# Patient Record
Sex: Female | Born: 1960 | Race: White | Hispanic: No | Marital: Married | Smoking: Never smoker
Health system: Southern US, Community
[De-identification: ages and names within clinical notes are randomized; demographics above are authoritative.]

## PROBLEM LIST (undated history)

## (undated) DIAGNOSIS — F909 Attention-deficit hyperactivity disorder, unspecified type: Secondary | ICD-10-CM

## (undated) DIAGNOSIS — Z860100 Personal history of colon polyps, unspecified: Secondary | ICD-10-CM

## (undated) DIAGNOSIS — B019 Varicella without complication: Secondary | ICD-10-CM

## (undated) HISTORY — DX: Attention-deficit hyperactivity disorder, unspecified type: F90.9

## (undated) HISTORY — DX: Personal history of colon polyps, unspecified: Z86.0100

## (undated) HISTORY — DX: Varicella without complication: B01.9

## (undated) HISTORY — DX: Personal history of colonic polyps: Z86.010

---

## 1987-01-15 HISTORY — PX: ABDOMINAL HYSTERECTOMY: SHX81

## 2016-04-29 DIAGNOSIS — F988 Other specified behavioral and emotional disorders with onset usually occurring in childhood and adolescence: Secondary | ICD-10-CM | POA: Insufficient documentation

## 2016-08-26 DIAGNOSIS — M7989 Other specified soft tissue disorders: Secondary | ICD-10-CM | POA: Insufficient documentation

## 2016-10-29 DIAGNOSIS — G479 Sleep disorder, unspecified: Secondary | ICD-10-CM | POA: Insufficient documentation

## 2017-11-12 ENCOUNTER — Ambulatory Visit (INDEPENDENT_AMBULATORY_CARE_PROVIDER_SITE_OTHER): Payer: TRICARE For Life (TFL) | Admitting: Family Medicine

## 2017-11-12 ENCOUNTER — Encounter: Payer: Self-pay | Admitting: Family Medicine

## 2017-11-12 VITALS — BP 108/90 | HR 101 | Temp 98.2°F | Ht 64.5 in | Wt 140.6 lb

## 2017-11-12 DIAGNOSIS — Z1211 Encounter for screening for malignant neoplasm of colon: Secondary | ICD-10-CM

## 2017-11-12 DIAGNOSIS — Z6823 Body mass index (BMI) 23.0-23.9, adult: Secondary | ICD-10-CM

## 2017-11-12 DIAGNOSIS — F988 Other specified behavioral and emotional disorders with onset usually occurring in childhood and adolescence: Secondary | ICD-10-CM | POA: Diagnosis not present

## 2017-11-12 DIAGNOSIS — R748 Abnormal levels of other serum enzymes: Secondary | ICD-10-CM

## 2017-11-12 DIAGNOSIS — Z1239 Encounter for other screening for malignant neoplasm of breast: Secondary | ICD-10-CM

## 2017-11-12 DIAGNOSIS — Z1283 Encounter for screening for malignant neoplasm of skin: Secondary | ICD-10-CM

## 2017-11-12 DIAGNOSIS — Z7689 Persons encountering health services in other specified circumstances: Secondary | ICD-10-CM

## 2017-11-12 DIAGNOSIS — Z0000 Encounter for general adult medical examination without abnormal findings: Secondary | ICD-10-CM

## 2017-11-12 MED ORDER — PHENTERMINE HCL 37.5 MG PO CAPS: 37.5000 mg | ORAL_CAPSULE | ORAL | 0 refills | Status: DC

## 2017-11-12 NOTE — Progress Notes (Signed)
Shirley Lee is a 57 y.o. female  Chief Complaint  Patient presents with  . Establish Care    pt here to establish care , pt wants referral for mammogram and colonoscopy. TDAP in 2017.    HPI: Shirley Lee is a 58 y.o. female here to establish care with our office. She states she was recent denied life insurance d/t elevated GGT of 101 in 04/2017. Last checked in 10/2017 and value was slightly elevated at 74. Normal hepatic function panel, hep panel. Pt denies excessive alcohol use, rarely if ever takes tylenol. No family h/o hepatic conditions.  Pt requests Rx refill of phentermine. She states she took for 1 mo at the beginning of the year but did not follow-up on it d/t husband's health issues at the time. She walks on the treadmill for 45 min 4x/wk and has significantly improved her diet. She would like to try phentermine for 2-3 mo along with continued lifestyle modification to see if she can get to goal weight of 130lbs.  Specialists: dermatology (needs referral)  Last PAP: s/p TAH (non-malignancy) Last mammo: needs referral Last colonoscopy: needs referral  Tdap - UTD  Patient Active Problem List   Diagnosis Date Noted  . Sleep disturbance 10/29/2016  . Bilateral swelling of feet 08/26/2016  . Attention deficit disorder (ADD) in adult 04/29/2016   Past Medical History:  Diagnosis Date  . Chicken pox   . History of colon polyps     Past Surgical History:  Procedure Laterality Date  . ABDOMINAL HYSTERECTOMY  1989    Social History   Socioeconomic History  . Marital status: Married    Spouse name: Not on file  . Number of children: Not on file  . Years of education: Not on file  . Highest education level: Not on file  Occupational History  . Not on file  Social Needs  . Financial resource strain: Not on file  . Food insecurity:    Worry: Not on file    Inability: Not on file  . Transportation needs:    Medical: Not on file    Non-medical: Not on file    Tobacco Use  . Smoking status: Never Smoker  . Smokeless tobacco: Never Used  Substance and Sexual Activity  . Alcohol use: Yes    Alcohol/week: 3.0 standard drinks    Types: 3 Glasses of wine per week  . Drug use: Never  . Sexual activity: Yes  Lifestyle  . Physical activity:    Days per week: Not on file    Minutes per session: Not on file  . Stress: Not on file  Relationships  . Social connections:    Talks on phone: Not on file    Gets together: Not on file    Attends religious service: Not on file    Active member of club or organization: Not on file    Attends meetings of clubs or organizations: Not on file    Relationship status: Not on file  . Intimate partner violence:    Fear of current or ex partner: Not on file    Emotionally abused: Not on file    Physically abused: Not on file    Forced sexual activity: Not on file  Other Topics Concern  . Not on file  Social History Narrative  . Not on file    No family history on file.    There is no immunization history on file for this patient.  Outpatient Encounter Medications  as of 11/12/2017  Medication Sig  . amphetamine-dextroamphetamine (ADDERALL XR) 30 MG 24 hr capsule Take by mouth.  Marland Kitchen amphetamine-dextroamphetamine (ADDERALL XR) 30 MG 24 hr capsule TK 1 C PO QAM  . cyclobenzaprine (FLEXERIL) 5 MG tablet Take by mouth.  Marland Kitchen tiZANidine (ZANAFLEX) 4 MG tablet TK 1 T PO Q 8 H PRF MUSCLE SPASM  . phentermine 37.5 MG capsule Take by mouth.   No facility-administered encounter medications on file as of 11/12/2017.     ROS: Gen: no fever, chills  Skin: no rash, itching ENT: no ear pain, ear drainage, nasal congestion, rhinorrhea, sinus pressure, sore throat; + tinnitus (x 2 years) Eyes: no blurry vision, double vision Resp: no cough, wheeze,SOB CV: no CP, palpitations, LE edema,  GI: no heartburn, n/v/d/c, abd pain GU: no dysuria, urgency, frequency, hematuria  MSK: no joint pain, myalgias, back pain Neuro:  no dizziness, headache, weakness, vertigo Psych: no depression, anxiety, insomnia   Allergies  Allergen Reactions  . Penicillins Other (See Comments)    Childhood allergy unknown of sypmtoms    BP 108/90 (BP Location: Right Arm, Patient Position: Sitting, Cuff Size: Normal)   Pulse (!) 101   Temp 98.2 F (36.8 C) (Oral)   Ht 5' 4.5" (1.638 m)   Wt 140 lb 9.6 oz (63.8 kg)   LMP  (LMP Unknown)   SpO2 95%   BMI 23.76 kg/m   Physical Exam  Constitutional: She is oriented to person, place, and time. She appears well-developed and well-nourished. No distress.  HENT:  Head: Normocephalic and atraumatic.  Right Ear: External ear normal.  Left Ear: External ear normal.  Nose: Nose normal.  Mouth/Throat: Oropharynx is clear and moist.  Eyes: Conjunctivae are normal. No scleral icterus.  Neck: Normal range of motion. Neck supple. No JVD present. No thyromegaly present.  Cardiovascular: Normal rate, regular rhythm, normal heart sounds and intact distal pulses.  No murmur heard. Pulmonary/Chest: Effort normal and breath sounds normal. No respiratory distress. She has no wheezes.  Abdominal: Soft. Bowel sounds are normal. She exhibits no distension and no mass. There is no tenderness.  Musculoskeletal: Normal range of motion. She exhibits tenderness. She exhibits no edema.  Lymphadenopathy:    She has no cervical adenopathy.  Neurological: She is alert and oriented to person, place, and time.  Skin: Skin is warm and dry.  Psychiatric: She has a normal mood and affect. Her behavior is normal.     A/P:  1. Encounter to establish care with new doctor 2. Annual physical exam - UTD on labs - immunizations UTD, declines flu vaccine  3. Attention deficit disorder (ADD) in adult - pt takes Adderall but does not need refill at this time. She will contact office when she does. Pt will need controlled substance agreement, UDS  4. Screening for breast cancer - MM DIGITAL SCREENING  BILATERAL; Future  5. Screening for colon cancer - Ambulatory referral to Gastroenterology  5. Elevated serum GGT level - down-trending since 04/2017 and normal hepatic function panel, hepatitis panel - will recheck in 3-4 mo  6. BMI 23.0-23.9, adult - pt walks on treadmill 45 min 4x/wk and has improved diet Rx: - phentermine 37.5 MG capsule; Take 1 capsule (37.5 mg total) by mouth every morning.  Dispense: 30 capsule; Refill: 0 - pt understands this med is to be used for no more than 3 mo and in conjunction with lifestyle interventions/modifcations - f/u in 3 mo or sooner PRN - ok to refill Rx  in 1 mo  7. Screening for skin cancer - Ambulatory referral to Dermatology  Discussed plan and reviewed medications with patient, including risks, benefits, and potential side effects. Pt expressed understand. All questions answered.

## 2017-12-09 ENCOUNTER — Telehealth: Payer: Self-pay | Admitting: Family Medicine

## 2017-12-09 NOTE — Telephone Encounter (Signed)
Pt came into off needing a refill on phentermine 37.5 mg, please send refill to [facility] on [address]. Please contact pt at [phone] when rx refill has been sent to pharmacy.

## 2017-12-25 ENCOUNTER — Ambulatory Visit
Admission: RE | Admit: 2017-12-25 | Discharge: 2017-12-25 | Disposition: A | Discharge status: Home / Self Care | Source: Ambulatory Visit | Attending: Family Medicine | Admitting: Family Medicine

## 2017-12-25 DIAGNOSIS — Z1239 Encounter for other screening for malignant neoplasm of breast: Secondary | ICD-10-CM

## 2018-01-02 ENCOUNTER — Other Ambulatory Visit: Payer: Self-pay | Admitting: Family Medicine

## 2018-01-02 DIAGNOSIS — Z6823 Body mass index (BMI) 23.0-23.9, adult: Secondary | ICD-10-CM

## 2018-01-02 MED ORDER — PHENTERMINE HCL 37.5 MG PO CAPS: 37.5000 mg | ORAL_CAPSULE | ORAL | 1 refills | Status: DC

## 2018-03-05 ENCOUNTER — Encounter: Payer: Self-pay | Admitting: Family Medicine

## 2018-03-05 ENCOUNTER — Ambulatory Visit (INDEPENDENT_AMBULATORY_CARE_PROVIDER_SITE_OTHER): Admitting: Family Medicine

## 2018-03-05 VITALS — BP 100/68 | HR 98 | Temp 97.7°F | Ht 64.5 in | Wt 145.6 lb

## 2018-03-05 DIAGNOSIS — F988 Other specified behavioral and emotional disorders with onset usually occurring in childhood and adolescence: Secondary | ICD-10-CM

## 2018-03-05 DIAGNOSIS — M549 Dorsalgia, unspecified: Secondary | ICD-10-CM

## 2018-03-05 MED ORDER — AMPHETAMINE-DEXTROAMPHET ER 30 MG PO CP24: 30.0000 mg | ORAL_CAPSULE | Freq: Every day | ORAL | 0 refills | Status: DC

## 2018-03-05 MED ORDER — CYCLOBENZAPRINE HCL 5 MG PO TABS: 5.0000 mg | ORAL_TABLET | Freq: Two times a day (BID) | ORAL | 1 refills | Status: DC | PRN

## 2018-03-05 NOTE — Progress Notes (Signed)
Shirley Lee is a 58 y.o. female  Chief Complaint  Patient presents with  . Follow-up    f/u-- med refill aderral, muscle relaxer    HPI: Shirley Lee is a 58 y.o. femalee  1. Pt notes trouble staying asleep. She is able to fall asleep usually without issue, but wakes up around 3am. She has tried melatonin in the past but was taking in the AM. Unsure of reason for this 2. She is going to the gum 5 days per week and working with a trainer - mix of cardio (1hr) and weights. Her weight has increased but she believes this is d/t increased muscle mass. She feels better and states her clothes fit better. 3. She needs a refill of her Adderall XR 30mg  daily which she takes daily for ADD. Symptoms well controlled, no side effects. 4. Pt also needs a refill of flexeril 5mg  that she takes PRN for back spasms. This has been a chronic issue x years. She estimates she takes 1 tab per week, sometimes more.   Past Medical History:  Diagnosis Date  . Chicken pox   . History of colon polyps     Past Surgical History:  Procedure Laterality Date  . ABDOMINAL HYSTERECTOMY  1989    Social History   Socioeconomic History  . Marital status: Married    Spouse name: Not on file  . Number of children: Not on file  . Years of education: Not on file  . Highest education level: Not on file  Occupational History  . Not on file  Social Needs  . Financial resource strain: Not on file  . Food insecurity:    Worry: Not on file    Inability: Not on file  . Transportation needs:    Medical: Not on file    Non-medical: Not on file  Tobacco Use  . Smoking status: Never Smoker  . Smokeless tobacco: Never Used  Substance and Sexual Activity  . Alcohol use: Yes    Alcohol/week: 3.0 standard drinks    Types: 3 Glasses of wine per week  . Drug use: Never  . Sexual activity: Yes  Lifestyle  . Physical activity:    Days per week: Not on file    Minutes per session: Not on file  .  Stress: Not on file  Relationships  . Social connections:    Talks on phone: Not on file    Gets together: Not on file    Attends religious service: Not on file    Active member of club or organization: Not on file    Attends meetings of clubs or organizations: Not on file    Relationship status: Not on file  . Intimate partner violence:    Fear of current or ex partner: Not on file    Emotionally abused: Not on file    Physically abused: Not on file    Forced sexual activity: Not on file  Other Topics Concern  . Not on file  Social History Narrative  . Not on file    No family history on file.   Immunization History  Administered Date(s) Administered  . Influenza-Unspecified 10/14/2017    Outpatient Encounter Medications as of 03/05/2018  Medication Sig  . amphetamine-dextroamphetamine (ADDERALL XR) 30 MG 24 hr capsule Take 1 capsule (30 mg total) by mouth daily.  . cyclobenzaprine (FLEXERIL) 5 MG tablet Take 1 tablet (5 mg total) by mouth 2 (two) times daily as needed for muscle spasms.  . [  DISCONTINUED] amphetamine-dextroamphetamine (ADDERALL XR) 30 MG 24 hr capsule Take by mouth.  . [DISCONTINUED] amphetamine-dextroamphetamine (ADDERALL XR) 30 MG 24 hr capsule TK 1 C PO QAM  . [DISCONTINUED] cyclobenzaprine (FLEXERIL) 5 MG tablet Take by mouth.  . [DISCONTINUED] phentermine 37.5 MG capsule Take 1 capsule (37.5 mg total) by mouth every morning.  . [DISCONTINUED] tiZANidine (ZANAFLEX) 4 MG tablet TK 1 T PO Q 8 H PRF MUSCLE SPASM   No facility-administered encounter medications on file as of 03/05/2018.      ROS: Gen: no fever, chills  Skin: no rash, itching ENT: no ear pain, ear drainage, nasal congestion, rhinorrhea, sinus pressure, sore throat Eyes: no blurry vision, double vision Resp: no cough, wheeze,SOB CV: no CP, palpitations, LE edema,  GI: no heartburn, n/v/d/c, abd pain GU: no dysuria, urgency, frequency, hematuria  MSK: no joint pain, myalgias; as above in  HPI Neuro: no dizziness, headache, weakness Psych: no depression, anxiety, insomnia   Allergies  Allergen Reactions  . Penicillins Other (See Comments)    Childhood allergy unknown of sypmtoms    BP 100/68   Pulse 98   Temp 97.7 F (36.5 C) (Oral)   Ht 5' 4.5" (1.638 m)   Wt 145 lb 9.6 oz (66 kg)   LMP  (LMP Unknown)   SpO2 96%   BMI 24.61 kg/m   Wt Readings from Last 3 Encounters:  03/05/18 145 lb 9.6 oz (66 kg)  11/12/17 140 lb 9.6 oz (63.8 kg)     Physical Exam  Constitutional: She is oriented to person, place, and time. She appears well-developed and well-nourished. No distress.  Neck: Neck supple.  Cardiovascular: Normal rate, regular rhythm and normal heart sounds.  No murmur heard. Pulmonary/Chest: Effort normal and breath sounds normal. She has no wheezes. She has no rhonchi.  Musculoskeletal: Normal range of motion.        General: No tenderness or edema.  Neurological: She is alert and oriented to person, place, and time.  Psychiatric: She has a normal mood and affect. Her behavior is normal.     A/P:  1. Attention deficit disorder (ADD) in adult - controlled substance agreement signed - Pain Mgmt, Profile 8 w/Conf, U - controlled substance database reviewed and appropriate Refill: - amphetamine-dextroamphetamine (ADDERALL XR) 30 MG 24 hr capsule; Take 1 capsule (30 mg total) by mouth daily.  Dispense: 30 capsule; Refill: 0 - f/u in 3 mo or sooner PRN  2. Musculoskeletal back pain - cont with regular stretching/strengthening exercises Refill: Flexeril 5mg  1 tab BID PRN #[id number]

## 2018-03-08 LAB — PAIN MGMT, PROFILE 8 W/CONF, U
6 ACETYLMORPHINE: NEGATIVE ng/mL (ref <10)
Alcohol Metabolites: POSITIVE ng/mL — AB (ref <500)
Amphetamine: 5036 ng/mL — ABNORMAL HIGH (ref <250)
Amphetamines: POSITIVE ng/mL — AB (ref <500)
BENZODIAZEPINES: NEGATIVE ng/mL (ref <100)
BUPRENORPHINE, URINE: NEGATIVE ng/mL (ref <5)
CREATININE: 72.8 mg/dL
Cocaine Metabolite: NEGATIVE ng/mL (ref <150)
ETHYL SULFATE (ETS): 296 ng/mL — AB (ref <100)
Ethyl Glucuronide (ETG): 813 ng/mL — ABNORMAL HIGH (ref <500)
MARIJUANA METABOLITE: NEGATIVE ng/mL (ref <20)
MDMA: NEGATIVE ng/mL (ref <500)
Methamphetamine: NEGATIVE ng/mL (ref <250)
OPIATES: NEGATIVE ng/mL (ref <100)
OXIDANT: NEGATIVE ug/mL (ref <200)
Oxycodone: NEGATIVE ng/mL (ref <100)
PH: 6.58 (ref 4.5–9.0)

## 2018-03-31 ENCOUNTER — Other Ambulatory Visit: Payer: Self-pay | Admitting: Nurse Practitioner

## 2018-03-31 ENCOUNTER — Encounter: Payer: Self-pay | Admitting: Nurse Practitioner

## 2018-03-31 ENCOUNTER — Other Ambulatory Visit: Payer: Self-pay

## 2018-03-31 ENCOUNTER — Ambulatory Visit (INDEPENDENT_AMBULATORY_CARE_PROVIDER_SITE_OTHER): Admitting: Nurse Practitioner

## 2018-03-31 VITALS — BP 128/76 | HR 101 | Temp 98.0°F | Ht 64.5 in | Wt 145.0 lb

## 2018-03-31 DIAGNOSIS — J00 Acute nasopharyngitis [common cold]: Secondary | ICD-10-CM | POA: Diagnosis not present

## 2018-03-31 MED ORDER — CHLORPHEN-PE-ACETAMINOPHEN 4-10-325 MG PO TABS: 1.0000 | ORAL_TABLET | Freq: Two times a day (BID) | ORAL | 0 refills | Status: DC

## 2018-03-31 MED ORDER — GUAIFENESIN-DM 100-10 MG/5ML PO SYRP: 5.0000 mL | ORAL_SOLUTION | ORAL | 0 refills | Status: DC | PRN

## 2018-03-31 MED ORDER — FLUTICASONE PROPIONATE 50 MCG/ACT NA SUSP: 2.0000 | Freq: Every day | NASAL | 0 refills | Status: DC

## 2018-03-31 MED ORDER — BENZONATATE 100 MG PO CAPS: 100.0000 mg | ORAL_CAPSULE | Freq: Three times a day (TID) | ORAL | 0 refills | Status: DC | PRN

## 2018-03-31 NOTE — Patient Instructions (Signed)

## 2018-03-31 NOTE — Progress Notes (Signed)
Subjective:  Patient ID: Shirley Lee, female    DOB: [birth year]  Age: 58 y.o. MRN: [id number]  CC: Cough (pt is c/o coughing,drainage,cant sleep,chest hurt/ going on 1 wl/ took otc--)  URI   This is a new problem. The current episode started in the past 7 days. The problem has been unchanged. There has been no fever. Associated symptoms include congestion, coughing, ear pain, a plugged ear sensation, rhinorrhea, sinus pain and a sore throat. Pertinent negatives include no headaches or wheezing. She has tried decongestant, acetaminophen and antihistamine for the symptoms. The treatment provided mild relief.  no known sick contact. Works from home  Reviewed past Medical, Social and Family history today.  Outpatient Medications Prior to Visit  Medication Sig Dispense Refill  . amphetamine-dextroamphetamine (ADDERALL XR) 30 MG 24 hr capsule Take 1 capsule (30 mg total) by mouth daily. 30 capsule 0  . cyclobenzaprine (FLEXERIL) 5 MG tablet Take 1 tablet (5 mg total) by mouth 2 (two) times daily as needed for muscle spasms. 60 tablet 1   No facility-administered medications prior to visit.     ROS See HPI  Objective:  BP 128/76   Pulse (!) 101   Temp 98 F (36.7 C) (Oral)   Ht 5' 4.5" (1.638 m)   Wt 145 lb (65.8 kg)   LMP  (LMP Unknown)   SpO2 98%   BMI 24.50 kg/m   BP Readings from Last 3 Encounters:  03/31/18 128/76  03/05/18 100/68  11/12/17 108/90    Wt Readings from Last 3 Encounters:  03/31/18 145 lb (65.8 kg)  03/05/18 145 lb 9.6 oz (66 kg)  11/12/17 140 lb 9.6 oz (63.8 kg)    Physical Exam Vitals signs reviewed.  HENT:     Head:     Jaw: No trismus.     Right Ear: Tympanic membrane, ear canal and external ear normal.     Left Ear: Tympanic membrane, ear canal and external ear normal.     Nose: Mucosal edema and rhinorrhea present.     Right Sinus: Maxillary sinus tenderness present. No frontal sinus tenderness.     Left Sinus: Maxillary sinus  tenderness present. No frontal sinus tenderness.     Mouth/Throat:     Pharynx: Uvula midline. Posterior oropharyngeal erythema present. No oropharyngeal exudate.  Eyes:     General: No scleral icterus. Neck:     Musculoskeletal: Normal range of motion and neck supple.  Cardiovascular:     Rate and Rhythm: Normal rate and regular rhythm.     Pulses: Normal pulses.     Heart sounds: Normal heart sounds.  Pulmonary:     Effort: Pulmonary effort is normal.     Breath sounds: Normal breath sounds.  Lymphadenopathy:     Cervical: Cervical adenopathy present.  Neurological:     Mental Status: She is alert and oriented to person, place, and time.     No results found for: WBC, HGB, HCT, PLT, GLUCOSE, CHOL, TRIG, HDL, LDLDIRECT, LDLCALC, ALT, AST, NA, K, CL, CREATININE, BUN, CO2, TSH, PSA, INR, GLUF, HGBA1C, MICROALBUR  Mm 3d Screen Breast Bilateral  Result Date: 12/25/2017 CLINICAL DATA:  Screening. EXAM: DIGITAL SCREENING BILATERAL MAMMOGRAM WITH TOMO AND CAD COMPARISON:  Previous exam(s). ACR Breast Density Category b: There are scattered areas of fibroglandular density. FINDINGS: There are no findings suspicious for malignancy. Images were processed with CAD. IMPRESSION: No mammographic evidence of malignancy. A result letter of this screening mammogram will be mailed directly  to the patient. RECOMMENDATION: Screening mammogram in one year. (Code:SM-B-01Y) BI-RADS CATEGORY  1: Negative. Electronically Signed   By: Sande Brothers M.D.   On: 12/25/2017 16:34    Assessment & Plan:   Evanjelina was seen today for cough.  Diagnoses and all orders for this visit:  Acute nasopharyngitis -     benzonatate (TESSALON) 100 MG capsule; Take 1 capsule (100 mg total) by mouth 3 (three) times daily as needed for cough. -     guaiFENesin-dextromethorphan (ROBITUSSIN DM) 100-10 MG/5ML syrup; Take 5 mLs by mouth every 4 (four) hours as needed for cough. -     fluticasone (FLONASE) 50 MCG/ACT nasal spray;  Place 2 sprays into both nostrils daily. -     Chlorphen-PE-Acetaminophen 4-10-325 MG TABS; Take 1 tablet by mouth every 12 (twelve) hours for 3 days.   I am having Winna M. Waymire start on benzonatate, guaiFENesin-dextromethorphan, fluticasone, and Chlorphen-PE-Acetaminophen. I am also having her maintain her amphetamine-dextroamphetamine and cyclobenzaprine.  Meds ordered this encounter  Medications  . benzonatate (TESSALON) 100 MG capsule    Sig: Take 1 capsule (100 mg total) by mouth 3 (three) times daily as needed for cough.    Dispense:  20 capsule    Refill:  0    Order Specific Question:   Supervising Provider    Answer:   MATTHEWS, CODY [[id number]]  . guaiFENesin-dextromethorphan (ROBITUSSIN DM) 100-10 MG/5ML syrup    Sig: Take 5 mLs by mouth every 4 (four) hours as needed for cough.    Dispense:  118 mL    Refill:  0    Order Specific Question:   Supervising Provider    Answer:   MATTHEWS, CODY [[id number]]  . fluticasone (FLONASE) 50 MCG/ACT nasal spray    Sig: Place 2 sprays into both nostrils daily.    Dispense:  16 g    Refill:  0    Order Specific Question:   Supervising Provider    Answer:   MATTHEWS, CODY [[id number]]  . Chlorphen-PE-Acetaminophen 4-10-325 MG TABS    Sig: Take 1 tablet by mouth every 12 (twelve) hours for 3 days.    Dispense:  6 tablet    Refill:  0    Order Specific Question:   Supervising Provider    Answer:   MATTHEWS, CODY [[id number]]    Problem List Items Addressed This Visit    None    Visit Diagnoses    Acute nasopharyngitis    -  Primary   Relevant Medications   benzonatate (TESSALON) 100 MG capsule   guaiFENesin-dextromethorphan (ROBITUSSIN DM) 100-10 MG/5ML syrup   fluticasone (FLONASE) 50 MCG/ACT nasal spray   Chlorphen-PE-Acetaminophen 4-10-325 MG TABS       Follow-up: Return if symptoms worsen or fail to improve.  Alysia Penna, NP

## 2018-04-02 ENCOUNTER — Ambulatory Visit (INDEPENDENT_AMBULATORY_CARE_PROVIDER_SITE_OTHER): Admitting: Family Medicine

## 2018-04-02 ENCOUNTER — Other Ambulatory Visit: Payer: Self-pay

## 2018-04-02 ENCOUNTER — Encounter: Payer: Self-pay | Admitting: Family Medicine

## 2018-04-02 VITALS — BP 118/70 | HR 97 | Temp 98.1°F | Ht 64.5 in | Wt 144.4 lb

## 2018-04-02 DIAGNOSIS — J069 Acute upper respiratory infection, unspecified: Secondary | ICD-10-CM

## 2018-04-02 DIAGNOSIS — B9789 Other viral agents as the cause of diseases classified elsewhere: Secondary | ICD-10-CM

## 2018-04-02 MED ORDER — HYDROCOD POLST-CPM POLST ER 10-8 MG/5ML PO SUER: 5.0000 mL | Freq: Two times a day (BID) | ORAL | 0 refills | Status: DC | PRN

## 2018-04-02 MED ORDER — ALBUTEROL SULFATE HFA 108 (90 BASE) MCG/ACT IN AERS: 2.0000 | INHALATION_SPRAY | Freq: Four times a day (QID) | RESPIRATORY_TRACT | 2 refills | Status: DC | PRN

## 2018-04-02 NOTE — Progress Notes (Signed)
Shirley Lee is a 58 y.o. female  Chief Complaint  Patient presents with  . Follow-up    dry cough lingering/ no other symtoms/ benzonate and norel    HPI: Shirley Lee is a 58 y.o. female who was seen 2 days ago by my colleague Alysia Penna and diagnosed with acute nasopharyngitis. It was recommended she use flonase daily and take tessalon PRN and chlorphen-PE-acetaminophen BID x 3 days.  Pt is here today and complains of a "lingering dry cough". No fever, chills. No CP, SOB, wheeze. She is having trouble sleeping due to the cough.  She notes a h/o asthma as an adult but does not have an inhaler she uses daily or PRN.   Past Medical History:  Diagnosis Date  . Chicken pox   . History of colon polyps     Past Surgical History:  Procedure Laterality Date  . ABDOMINAL HYSTERECTOMY  1989    Social History   Socioeconomic History  . Marital status: Married    Spouse name: Not on file  . Number of children: Not on file  . Years of education: Not on file  . Highest education level: Not on file  Occupational History  . Not on file  Social Needs  . Financial resource strain: Not on file  . Food insecurity:    Worry: Not on file    Inability: Not on file  . Transportation needs:    Medical: Not on file    Non-medical: Not on file  Tobacco Use  . Smoking status: Never Smoker  . Smokeless tobacco: Never Used  Substance and Sexual Activity  . Alcohol use: Yes    Alcohol/week: 3.0 standard drinks    Types: 3 Glasses of wine per week  . Drug use: Never  . Sexual activity: Yes  Lifestyle  . Physical activity:    Days per week: Not on file    Minutes per session: Not on file  . Stress: Not on file  Relationships  . Social connections:    Talks on phone: Not on file    Gets together: Not on file    Attends religious service: Not on file    Active member of club or organization: Not on file    Attends meetings of clubs or organizations: Not on file   Relationship status: Not on file  . Intimate partner violence:    Fear of current or ex partner: Not on file    Emotionally abused: Not on file    Physically abused: Not on file    Forced sexual activity: Not on file  Other Topics Concern  . Not on file  Social History Narrative  . Not on file    No family history on file.   Immunization History  Administered Date(s) Administered  . Influenza-Unspecified 10/14/2017    Outpatient Encounter Medications as of 04/02/2018  Medication Sig  . amphetamine-dextroamphetamine (ADDERALL XR) 30 MG 24 hr capsule Take 1 capsule (30 mg total) by mouth daily.  . benzonatate (TESSALON) 100 MG capsule Take 1 capsule (100 mg total) by mouth 3 (three) times daily as needed for cough.  . cyclobenzaprine (FLEXERIL) 5 MG tablet Take 1 tablet (5 mg total) by mouth 2 (two) times daily as needed for muscle spasms.  . fluticasone (FLONASE) 50 MCG/ACT nasal spray SHAKE LIQUID AND USE 2 SPRAYS IN EACH NOSTRIL DAILY  . guaiFENesin-dextromethorphan (ROBITUSSIN DM) 100-10 MG/5ML syrup Take 5 mLs by mouth every 4 (four) hours as  needed for cough.  . [DISCONTINUED] Chlorphen-PE-Acetaminophen 4-10-325 MG TABS Take 1 tablet by mouth every 12 (twelve) hours for 3 days.  Marland Kitchen albuterol (PROVENTIL HFA;VENTOLIN HFA) 108 (90 Base) MCG/ACT inhaler Inhale 2 puffs into the lungs every 6 (six) hours as needed for wheezing or shortness of breath.  . chlorpheniramine-HYDROcodone (TUSSIONEX PENNKINETIC ER) 10-8 MG/5ML SUER Take 5 mLs by mouth every 12 (twelve) hours as needed for cough.   No facility-administered encounter medications on file as of 04/02/2018.      ROS: Pertinent positives and negatives noted in HPI. Remainder of ROS non-contributory    Allergies  Allergen Reactions  . Penicillins Other (See Comments)    Childhood allergy unknown of sypmtoms    BP 118/70   Pulse 97   Temp 98.1 F (36.7 C) (Oral)   Ht 5' 4.5" (1.638 m)   Wt 144 lb 6.4 oz (65.5 kg)   LMP   (LMP Unknown)   SpO2 97%   BMI 24.40 kg/m   Physical Exam  Constitutional: She is oriented to person, place, and time. She appears well-developed and well-nourished. No distress.  Neck: Neck supple.  Cardiovascular: Normal rate and regular rhythm.  Pulmonary/Chest: Effort normal and breath sounds normal. No respiratory distress. She has no wheezes. She has no rhonchi.  Lymphadenopathy:    She has no cervical adenopathy.  Neurological: She is alert and oriented to person, place, and time.     A/P:  1. Viral URI with cough - cont supportive care Rx: - albuterol (PROVENTIL HFA;VENTOLIN HFA) 108 (90 Base) MCG/ACT inhaler; Inhale 2 puffs into the lungs every 6 (six) hours as needed for wheezing or shortness of breath.  Dispense: 1 Inhaler; Refill: 2 - chlorpheniramine-HYDROcodone (TUSSIONEX PENNKINETIC ER) 10-8 MG/5ML SUER; Take 5 mLs by mouth every 12 (twelve) hours as needed for cough.  Dispense: 140 mL; Refill: 0 - explained that cough can linger and typical viral URI symptoms can last 10-14 days - advised f/u if symptoms worsen or do not improve in 1 week Discussed plan and reviewed medications with patient, including risks, benefits, and potential side effects. Pt expressed understand. All questions answered.

## 2018-04-08 ENCOUNTER — Other Ambulatory Visit: Payer: Self-pay | Admitting: Family Medicine

## 2018-04-08 ENCOUNTER — Encounter: Payer: Self-pay | Admitting: Family Medicine

## 2018-04-08 DIAGNOSIS — J069 Acute upper respiratory infection, unspecified: Secondary | ICD-10-CM

## 2018-04-08 DIAGNOSIS — B9789 Other viral agents as the cause of diseases classified elsewhere: Principal | ICD-10-CM

## 2018-04-08 NOTE — Telephone Encounter (Signed)
Pt request

## 2018-05-22 ENCOUNTER — Encounter: Payer: Self-pay | Admitting: Family Medicine

## 2018-05-22 ENCOUNTER — Ambulatory Visit (INDEPENDENT_AMBULATORY_CARE_PROVIDER_SITE_OTHER): Admitting: Family Medicine

## 2018-05-22 ENCOUNTER — Other Ambulatory Visit: Payer: Self-pay | Admitting: Family Medicine

## 2018-05-22 DIAGNOSIS — F988 Other specified behavioral and emotional disorders with onset usually occurring in childhood and adolescence: Secondary | ICD-10-CM

## 2018-05-22 DIAGNOSIS — M549 Dorsalgia, unspecified: Secondary | ICD-10-CM | POA: Diagnosis not present

## 2018-05-22 MED ORDER — AMPHETAMINE-DEXTROAMPHET ER 30 MG PO CP24: 30.0000 mg | ORAL_CAPSULE | Freq: Every day | ORAL | 0 refills | Status: DC

## 2018-05-22 MED ORDER — CYCLOBENZAPRINE HCL 5 MG PO TABS: 5.0000 mg | ORAL_TABLET | Freq: Two times a day (BID) | ORAL | 2 refills | Status: DC | PRN

## 2018-05-22 NOTE — Telephone Encounter (Signed)
Please schedule pt for virtual visit as she is due for 3 mo f/u since last OV for this was 02/2018

## 2018-05-22 NOTE — Progress Notes (Signed)
Virtual Visit via Video Note  I connected with Shirley Lee on 05/22/18 at  3:00 PM EDT by a video enabled telemedicine application and verified that I am speaking with the correct person using two identifiers. Location patient: home Location provider: work  Persons participating in the virtual visit: patient, provider  I discussed the limitations of evaluation and management by telemedicine and the availability of in person appointments. The patient expressed understanding and agreed to proceed.  Interactive audio and video telecommunications were attempted between myself and the patient, however failed, due to the patient having technical difficulties. We continued and completed the visit with audio only.   Chief Complaint  Patient presents with  . Follow-up    f/u ADD/ medication refills flexeril and adderal    HPI: Shirley Lee is a 58 y.o. female here for f/u on her ADD and back pain, both chronic issues. She requests refills of her Adderall XR 30mg  daily and flexeril 5mg  BID PRN. She feels both issues are at baseline, no change from last OV in 02/2018 and no worsening of symptoms. Appetite is good/normal, sleep is normal (sleeps well some nights, wakes up during the night other nights). No HA, vision changes, dizziness, CP, SOB, palpitations, n/v, LE edema. She would like to continue on these meds at current doses.    Past Medical History:  Diagnosis Date  . Chicken pox   . History of colon polyps     Past Surgical History:  Procedure Laterality Date  . ABDOMINAL HYSTERECTOMY  1989    No family history on file.  Social History   Tobacco Use  . Smoking status: Never Smoker  . Smokeless tobacco: Never Used  Substance Use Topics  . Alcohol use: Yes    Alcohol/week: 3.0 standard drinks    Types: 3 Glasses of wine per week  . Drug use: Never     Current Outpatient Medications:  .  albuterol (PROVENTIL HFA;VENTOLIN HFA) 108 (90 Base) MCG/ACT inhaler,  Inhale 2 puffs into the lungs every 6 (six) hours as needed for wheezing or shortness of breath., Disp: 1 Inhaler, Rfl: 2 .  amphetamine-dextroamphetamine (ADDERALL XR) 30 MG 24 hr capsule, Take 1 capsule (30 mg total) by mouth daily., Disp: 30 capsule, Rfl: 0 .  cyclobenzaprine (FLEXERIL) 5 MG tablet, Take 1 tablet (5 mg total) by mouth 2 (two) times daily as needed for muscle spasms., Disp: 60 tablet, Rfl: 2  Allergies  Allergen Reactions  . Penicillins Other (See Comments)    Childhood allergy unknown of sypmtoms    ROS: See pertinent positives and negatives per HPI.   EXAM:  VITALS per patient if applicable: None taken  GENERAL: alert, oriented  LUNGS: no obvious SOB, gasping or wheezing, no conversational dyspnea  PSYCH/NEURO: pleasant and cooperative, speech and thought processing grossly intact   ASSESSMENT AND PLAN:  1. Attention deficit disorder (ADD) in adult - stable, well-controlled - PMP Aware reviewed and appropriate - controlled substance agreement and UDS current Refill: - amphetamine-dextroamphetamine (ADDERALL XR) 30 MG 24 hr capsule; Take 1 capsule (30 mg total) by mouth daily.  Dispense: 30 capsule; Refill: 0 - f/u in 3 mo or sooner PRN  2. Musculoskeletal back pain - stable Refill: - cyclobenzaprine (FLEXERIL) 5 MG tablet; Take 1 tablet (5 mg total) by mouth 2 (two) times daily as needed for muscle spasms.  Dispense: 60 tablet; Refill: 2 - f/u PRN    I discussed the assessment and treatment plan with the patient.  The patient was provided an opportunity to ask questions and all were answered. The patient agreed with the plan and demonstrated an understanding of the instructions.   The patient was advised to call back or seek an in-person evaluation if the symptoms worsen or if the condition fails to improve as anticipated.   Letta Median, DO

## 2018-05-22 NOTE — Telephone Encounter (Signed)
Dr. Salena Saner please advise last OV 02/2018

## 2018-08-26 ENCOUNTER — Encounter: Payer: Self-pay | Admitting: Family Medicine

## 2018-08-28 ENCOUNTER — Encounter: Payer: Self-pay | Admitting: Family Medicine

## 2018-08-28 ENCOUNTER — Ambulatory Visit (INDEPENDENT_AMBULATORY_CARE_PROVIDER_SITE_OTHER): Admitting: Family Medicine

## 2018-08-28 VITALS — BP 112/70 | HR 96 | Temp 98.0°F | Ht 64.5 in | Wt 143.4 lb

## 2018-08-28 DIAGNOSIS — S41152D Open bite of left upper arm, subsequent encounter: Secondary | ICD-10-CM | POA: Diagnosis not present

## 2018-08-28 DIAGNOSIS — F988 Other specified behavioral and emotional disorders with onset usually occurring in childhood and adolescence: Secondary | ICD-10-CM | POA: Diagnosis not present

## 2018-08-28 DIAGNOSIS — Z4802 Encounter for removal of sutures: Secondary | ICD-10-CM

## 2018-08-28 DIAGNOSIS — W540XXD Bitten by dog, subsequent encounter: Secondary | ICD-10-CM

## 2018-08-28 MED ORDER — AMPHETAMINE-DEXTROAMPHET ER 30 MG PO CP24: 30.0000 mg | ORAL_CAPSULE | Freq: Every day | ORAL | 0 refills | Status: DC

## 2018-08-28 NOTE — Progress Notes (Signed)
Shirley Lee is a 58 y.o. female  Chief Complaint  Patient presents with  . Suture / Staple Removal    left forearm/refill ADHD    HPI: Shirley Lee is a 58 y.o. female who is here to have sutures removed from Lt forearm. She was bit by a dog 1 week ago while walking with a friend. She was seen at local clinic (she was out of town), given Rx for abx, and 3 sutures placed. No issues since then other than Lt forearm soreness. No fever, chills, redness, drainage.  She is also due for routine follow-up on her ADD for which she takes Adderall XR 30mg  daily. She has been on this med/dose for some time and feels it is effective at controlling symptoms. Appetite is good/normal, sleep is normal (sleeps well some nights, wakes up during the night other nights). No HA, vision changes, dizziness, CP, SOB, palpitations, n/v, LE edema. She would like to continue med at current doses.   Past Medical History:  Diagnosis Date  . Chicken pox   . History of colon polyps     Past Surgical History:  Procedure Laterality Date  . ABDOMINAL HYSTERECTOMY  1989    Social History   Socioeconomic History  . Marital status: Married    Spouse name: Not on file  . Number of children: Not on file  . Years of education: Not on file  . Highest education level: Not on file  Occupational History  . Not on file  Social Needs  . Financial resource strain: Not on file  . Food insecurity    Worry: Not on file    Inability: Not on file  . Transportation needs    Medical: Not on file    Non-medical: Not on file  Tobacco Use  . Smoking status: Never Smoker  . Smokeless tobacco: Never Used  Substance and Sexual Activity  . Alcohol use: Yes    Alcohol/week: 3.0 standard drinks    Types: 3 Glasses of wine per week  . Drug use: Never  . Sexual activity: Yes  Lifestyle  . Physical activity    Days per week: Not on file    Minutes per session: Not on file  . Stress: Not on file   Relationships  . Social Musicianconnections    Talks on phone: Not on file    Gets together: Not on file    Attends religious service: Not on file    Active member of club or organization: Not on file    Attends meetings of clubs or organizations: Not on file    Relationship status: Not on file  . Intimate partner violence    Fear of current or ex partner: Not on file    Emotionally abused: Not on file    Physically abused: Not on file    Forced sexual activity: Not on file  Other Topics Concern  . Not on file  Social History Narrative  . Not on file    No family history on file.   Immunization History  Administered Date(s) Administered  . Influenza-Unspecified 10/14/2017    Outpatient Encounter Medications as of 08/28/2018  Medication Sig  . albuterol (PROVENTIL HFA;VENTOLIN HFA) 108 (90 Base) MCG/ACT inhaler Inhale 2 puffs into the lungs every 6 (six) hours as needed for wheezing or shortness of breath.  . amphetamine-dextroamphetamine (ADDERALL XR) 30 MG 24 hr capsule Take 1 capsule (30 mg total) by mouth daily.  . cyclobenzaprine (FLEXERIL) 5 MG  tablet Take 1 tablet (5 mg total) by mouth 2 (two) times daily as needed for muscle spasms.   No facility-administered encounter medications on file as of 08/28/2018.      ROS: Pertinent positives and negatives noted in HPI. Remainder of ROS non-contributory    Allergies  Allergen Reactions  . Penicillins Other (See Comments)    Childhood allergy unknown of sypmtoms    BP 112/70   Pulse 96   Temp 98 F (36.7 C) (Oral)   Ht 5' 4.5" (1.638 m)   Wt 143 lb 6.4 oz (65 kg)   LMP  (LMP Unknown)   SpO2 97%   BMI 24.23 kg/m   Physical Exam  Constitutional: She is oriented to person, place, and time. She appears well-developed and well-nourished. No distress.  Cardiovascular: Normal rate, regular rhythm and normal heart sounds.  No murmur heard. Pulmonary/Chest: Effort normal and breath sounds normal. No respiratory distress.   Neurological: She is alert and oriented to person, place, and time.  Skin:     Psychiatric: She has a normal mood and affect. Her behavior is normal.     A/P:  1. Attention deficit disorder (ADD) in adult - stable, well-controlled - controlled substance database reviewed and appropriate Refill: - amphetamine-dextroamphetamine (ADDERALL XR) 30 MG 24 hr capsule; Take 1 capsule (30 mg total) by mouth daily.  Dispense: 30 capsule; Refill: 0 - f/u in 3 mo or sooner PRN  2. Visit for suture removal 3. Dog bite of left upper extremity, subsequent encounter - 3 sutures removed without incident - pt has 2 days of abx and will complete full course - no further eval or f/u needed

## 2018-09-22 ENCOUNTER — Other Ambulatory Visit: Payer: Self-pay | Admitting: Family Medicine

## 2018-09-22 DIAGNOSIS — F988 Other specified behavioral and emotional disorders with onset usually occurring in childhood and adolescence: Secondary | ICD-10-CM

## 2018-09-23 MED ORDER — AMPHETAMINE-DEXTROAMPHET ER 30 MG PO CP24: 30.0000 mg | ORAL_CAPSULE | Freq: Every day | ORAL | 0 refills | Status: DC

## 2018-09-23 NOTE — Telephone Encounter (Signed)
Baldo Ash please advise in Dr. Loletha Grayer absence  Last ov and refill was 08/28/2018 for 30 tab PMP check last 3 pick up at [facility] for 30 tab was 08/28/18, 05/23/2018 and 03/05/2018.

## 2018-11-04 ENCOUNTER — Other Ambulatory Visit: Payer: Self-pay | Admitting: Nurse Practitioner

## 2018-11-04 DIAGNOSIS — F988 Other specified behavioral and emotional disorders with onset usually occurring in childhood and adolescence: Secondary | ICD-10-CM

## 2018-11-04 MED ORDER — AMPHETAMINE-DEXTROAMPHET ER 30 MG PO CP24: 30.0000 mg | ORAL_CAPSULE | Freq: Every day | ORAL | 0 refills | Status: DC

## 2018-12-09 ENCOUNTER — Other Ambulatory Visit: Payer: Self-pay | Admitting: Family Medicine

## 2018-12-09 ENCOUNTER — Encounter: Payer: Self-pay | Admitting: Family Medicine

## 2018-12-09 DIAGNOSIS — F988 Other specified behavioral and emotional disorders with onset usually occurring in childhood and adolescence: Secondary | ICD-10-CM

## 2018-12-09 MED ORDER — AMPHETAMINE-DEXTROAMPHET ER 30 MG PO CP24: 30.0000 mg | ORAL_CAPSULE | Freq: Every day | ORAL | 0 refills | Status: DC

## 2018-12-09 NOTE — Telephone Encounter (Signed)
Will refill today but pt is due for f/u appt (every 3 mo and last was in 08/2018). Pt can schedule VV sometime before next refill

## 2018-12-09 NOTE — Telephone Encounter (Signed)
msg sent to pt to get her scheduled.

## 2019-01-10 ENCOUNTER — Emergency Department (HOSPITAL_COMMUNITY)

## 2019-01-10 ENCOUNTER — Other Ambulatory Visit: Payer: Self-pay

## 2019-01-10 ENCOUNTER — Encounter (HOSPITAL_COMMUNITY): Payer: Self-pay | Admitting: Emergency Medicine

## 2019-01-10 ENCOUNTER — Emergency Department (HOSPITAL_COMMUNITY)
Admission: EM | Admit: 2019-01-10 | Discharge: 2019-01-10 | Disposition: A | Discharge status: Home / Self Care | Attending: Emergency Medicine | Admitting: Emergency Medicine

## 2019-01-10 DIAGNOSIS — Y9301 Activity, walking, marching and hiking: Secondary | ICD-10-CM | POA: Diagnosis not present

## 2019-01-10 DIAGNOSIS — W010XXA Fall on same level from slipping, tripping and stumbling without subsequent striking against object, initial encounter: Secondary | ICD-10-CM | POA: Diagnosis not present

## 2019-01-10 DIAGNOSIS — Z79899 Other long term (current) drug therapy: Secondary | ICD-10-CM | POA: Diagnosis not present

## 2019-01-10 DIAGNOSIS — S93602A Unspecified sprain of left foot, initial encounter: Secondary | ICD-10-CM | POA: Diagnosis not present

## 2019-01-10 DIAGNOSIS — S93402A Sprain of unspecified ligament of left ankle, initial encounter: Secondary | ICD-10-CM | POA: Diagnosis not present

## 2019-01-10 DIAGNOSIS — S99912A Unspecified injury of left ankle, initial encounter: Secondary | ICD-10-CM | POA: Diagnosis present

## 2019-01-10 DIAGNOSIS — Y929 Unspecified place or not applicable: Secondary | ICD-10-CM | POA: Diagnosis not present

## 2019-01-10 DIAGNOSIS — Y999 Unspecified external cause status: Secondary | ICD-10-CM | POA: Diagnosis not present

## 2019-01-10 NOTE — ED Triage Notes (Signed)
Patient here from home with complaints of fall yesterday while walking the dog. Reports left foot and ankle pain. Ambulatory with pain.

## 2019-01-10 NOTE — ED Provider Notes (Signed)
[facility]-EMERGENCY DEPT Provider Note   CSN: [id number] Arrival date & time: 01/10/19  1032     History Chief Complaint  Patient presents with  . Fall  . Foot Injury  . Ankle Pain    Shirley Lee is a 58 y.o. female.  Shirley Lee is a 58 y.o. female who is otherwise healthy, presents to the ED for evaluation of injury to the left foot and ankle.  She states this occurred yesterday afternoon when she was walking her dog down the hill and got tripped up rolling her left foot and ankle.  She reports since then she has had constant pain that is worse with weightbearing and walking.  States that she has been applying ice regularly since the injury occurred which has helped reduce swelling but she continues to have pain over the top of the foot and the lateral ankle.  She denies any previous surgeries or injuries to the ankle.  Denies any numbness tingling or rest.  No discoloration or wounds.  No other aggravating or alleviating factors.  The history is provided by the patient.       Past Medical History:  Diagnosis Date  . Chicken pox   . History of colon polyps     Patient Active Problem List   Diagnosis Date Noted  . Sleep disturbance 10/29/2016  . Bilateral swelling of feet 08/26/2016  . Attention deficit disorder (ADD) in adult 04/29/2016    Past Surgical History:  Procedure Laterality Date  . ABDOMINAL HYSTERECTOMY  1989     OB History   No obstetric history on file.     No family history on file.  Social History   Tobacco Use  . Smoking status: Never Smoker  . Smokeless tobacco: Never Used  Substance Use Topics  . Alcohol use: Yes    Alcohol/week: 3.0 standard drinks    Types: 3 Glasses of wine per week  . Drug use: Never    Home Medications Prior to Admission medications   Medication Sig Start Date End Date Taking? Authorizing Provider  albuterol (PROVENTIL HFA;VENTOLIN HFA) 108 (90 Base) MCG/ACT inhaler  Inhale 2 puffs into the lungs every 6 (six) hours as needed for wheezing or shortness of breath. 04/02/18   Cirigliano, Jearld Lesch, DO  amphetamine-dextroamphetamine (ADDERALL XR) 30 MG 24 hr capsule Take 1 capsule (30 mg total) by mouth daily. 12/09/18   Cirigliano, Jearld Lesch, DO  cyclobenzaprine (FLEXERIL) 5 MG tablet Take 1 tablet (5 mg total) by mouth 2 (two) times daily as needed for muscle spasms. 05/22/18   CiriglianoJearld Lesch, DO    Allergies    Penicillins  Review of Systems   Review of Systems  Constitutional: Negative for chills and fever.  Musculoskeletal: Positive for arthralgias. Negative for joint swelling.  Skin: Negative for color change and rash.  Neurological: Negative for weakness and numbness.    Physical Exam Updated Vital Signs BP 106/76 (BP Location: Left Arm)   Pulse 98   Temp 98.3 F (36.8 C) (Oral)   Resp 14   Ht 5' 4.5" (1.638 m)   LMP  (LMP Unknown)   SpO2 100%   BMI 24.23 kg/m   Physical Exam Vitals and nursing note reviewed.  Constitutional:      General: She is not in acute distress.    Appearance: Normal appearance. She is well-developed and normal weight. She is not diaphoretic.  HENT:     Head: Normocephalic and atraumatic.  Eyes:  General:        Right eye: No discharge.        Left eye: No discharge.  Pulmonary:     Effort: Pulmonary effort is normal. No respiratory distress.  Musculoskeletal:     Comments: There is swelling and tenderness over the lateral malleolus and the top pf the left foot. No overt deformity. No tenderness over the medial aspect of the ankle. The ankle joint is intact without excessive opening on stressing.  No bruising or discoloration.  2+ DP and TP pulses and good capillary refill.  Normal sensation.  Strength limited due to pain.   Skin:    General: Skin is warm and dry.  Neurological:     Mental Status: She is alert.     Coordination: Coordination normal.  Psychiatric:        Mood and Affect: Mood normal.          Behavior: Behavior normal.     ED Results / Procedures / Treatments   Labs (all labs ordered are listed, but only abnormal results are displayed) Labs Reviewed - No data to display  EKG None  Radiology DG Ankle Complete Left  Result Date: 01/10/2019 CLINICAL DATA:  Twisting left ankle injury earlier today with pain and swelling. EXAM: LEFT ANKLE COMPLETE - 3+ VIEW COMPARISON:  None. FINDINGS: Examination demonstrates no evidence of acute fracture or dislocation. 2 mm fragment adjacent the cuboid bone on the lateral film likely chronic. IMPRESSION: No acute findings. Electronically Signed   By: Marin Olp M.D.   On: 01/10/2019 11:00    Procedures Procedures (including critical care time)  Medications Ordered in ED Medications - No data to display  ED Course  I have reviewed the triage vital signs and the nursing notes.  Pertinent labs & imaging results that were available during my care of the patient were reviewed by me and considered in my medical decision making (see chart for details).  Clinical Course as of Jan 09 1145  Sun Jan 10, 2019  1100 X-rays of the left ankle and foot showed no acute fracture or bony abnormality.  Exam consistent with ankle sprain likely sprain of the forefoot as well given tenderness and mechanism of injury.  ASO brace and crutches given.  DG Ankle Complete Left [KF]    Clinical Course User Index [KF] Janet Berlin   MDM Rules/Calculators/A&P                      Presentation consistent with gout and foot.  Tenderness and swelling over lateral malleolus and top of the left foot, pt is neurovascularly intact, and x-rays negative for fracture, and shows ankle mortise is intact. Pain treated in the ED. Pt placed in ASO brace and provided crutches, ambulated without difficulty. Pt stable for discharge home with ibuprofen for pain. Pt to follow-up with sports medicine in one week if symptoms not improving. Return precautions discussed,  Pt expresses understanding and agrees with plan.  Final Clinical Impression(s) / ED Diagnoses Final diagnoses:  Sprain of left ankle, unspecified ligament, initial encounter  Sprain of left foot, initial encounter    Rx / DC Orders ED Discharge Orders    None       Janet Berlin 01/10/19 1146    Gareth Morgan, MD 01/11/19 1343

## 2019-01-10 NOTE — Discharge Instructions (Signed)
Wear ankle brace and use crutches, ice and elevate the foot, after 48 hours alternate ice and heat continue using ibuprofen or Aleve as well as Tylenol for pain relief.  If symptoms or not improving in 1 week please follow-up with Dr. Barbaraann Barthel with sports medicine.

## 2019-01-12 ENCOUNTER — Other Ambulatory Visit: Payer: Self-pay

## 2019-01-12 ENCOUNTER — Encounter: Payer: Self-pay | Admitting: Family Medicine

## 2019-01-12 ENCOUNTER — Ambulatory Visit (INDEPENDENT_AMBULATORY_CARE_PROVIDER_SITE_OTHER): Admitting: Family Medicine

## 2019-01-12 VITALS — BP 130/70 | Ht 64.5 in | Wt 140.0 lb

## 2019-01-12 DIAGNOSIS — S93622A Sprain of tarsometatarsal ligament of left foot, initial encounter: Secondary | ICD-10-CM

## 2019-01-12 NOTE — Progress Notes (Signed)
PCP: Shirley Mam, DO  Subjective:   HPI: Patient is a 58 y.o. female here for evaluation of left foot pain.  Patient notes she fell while walking her dog the day after Christmas.  She plantarflexed her foot and had a dorsal injury to her foot when she fell.  Since then patient has had excruciating pain on the dorsal aspect of her foot.  She notes swelling as well as some light bruising that happened following the injury.  Patient went to the emergency room and had x-rays done of her ankle and her foot which did not show any abnormalities.  She was given an ASO brace instructed follow-up here.  Patient notes she has not been able to bear any weight on her foot due to the pain.  She has been using crutches.  The ASO brace is also caused her significant pain due to the pressure to put on the dorsal aspect of her foot.  She has no numbness or tingling in the area.  She notes the pain is a sharp stabbing quality and is rated 10 out of 10 at its worst.  Review of Systems: See HPI above.  Past Medical History:  Diagnosis Date  . Chicken pox   . History of colon polyps     Current Outpatient Medications on File Prior to Visit  Medication Sig Dispense Refill  . albuterol (PROVENTIL HFA;VENTOLIN HFA) 108 (90 Base) MCG/ACT inhaler Inhale 2 puffs into the lungs every 6 (six) hours as needed for wheezing or shortness of breath. 1 Inhaler 2  . amphetamine-dextroamphetamine (ADDERALL XR) 30 MG 24 hr capsule Take 1 capsule (30 mg total) by mouth daily. 30 capsule 0  . cyclobenzaprine (FLEXERIL) 5 MG tablet Take 1 tablet (5 mg total) by mouth 2 (two) times daily as needed for muscle spasms. 60 tablet 2   No current facility-administered medications on file prior to visit.    Past Surgical History:  Procedure Laterality Date  . ABDOMINAL HYSTERECTOMY  1989    Allergies  Allergen Reactions  . Penicillins Other (See Comments)    Childhood allergy unknown of sypmtoms    Social History    Socioeconomic History  . Marital status: Married    Spouse name: Not on file  . Number of children: Not on file  . Years of education: Not on file  . Highest education level: Not on file  Occupational History  . Not on file  Tobacco Use  . Smoking status: Never Smoker  . Smokeless tobacco: Never Used  Substance and Sexual Activity  . Alcohol use: Yes    Alcohol/week: 3.0 standard drinks    Types: 3 Glasses of wine per week  . Drug use: Never  . Sexual activity: Yes  Other Topics Concern  . Not on file  Social History Narrative  . Not on file   Social Determinants of Health   Financial Resource Strain:   . Difficulty of Paying Living Expenses: Not on file  Food Insecurity:   . Worried About Programme researcher, broadcasting/film/video in the Last Year: Not on file  . Ran Out of Food in the Last Year: Not on file  Transportation Needs:   . Lack of Transportation (Medical): Not on file  . Lack of Transportation (Non-Medical): Not on file  Physical Activity:   . Days of Exercise per Week: Not on file  . Minutes of Exercise per Session: Not on file  Stress:   . Feeling of Stress : Not on  file  Social Connections:   . Frequency of Communication with Friends and Family: Not on file  . Frequency of Social Gatherings with Friends and Family: Not on file  . Attends Religious Services: Not on file  . Active Member of Clubs or Organizations: Not on file  . Attends Archivist Meetings: Not on file  . Marital Status: Not on file  Intimate Partner Violence:   . Fear of Current or Ex-Partner: Not on file  . Emotionally Abused: Not on file  . Physically Abused: Not on file  . Sexually Abused: Not on file    History reviewed. No pertinent family history.      Objective:  Physical Exam: BP 130/70   Ht 5' 4.5" (1.638 m)   Wt 140 lb (63.5 kg)   LMP  (LMP Unknown)   BMI 23.66 kg/m  Gen: NAD, comfortable in exam room Lungs: Breathing comfortably on room air Ankle/Foot  Exam Left -Inspection: Swelling noted on the dorsal aspect of the foot.  Very minor bruising seen.  No deformity noted -Palpation: Tenderness palpation throughout the dorsal aspect of the foot -ROM: Normal range of motion at the ankle with dorsiflexion, plantarflexion, inversion, eversion.  Patient unable to move the toes without pain.  Examination of this was limited -Ankle strength: Dorsiflexion: 5/5; Plantarflexion: 5/5; Inversion: 5/5; Eversion: 5/5.  Exam limited due to pain -Unable to assess stability of Lisfranc joint due to pain -Limb neurovascularly intact, no instability noted  Contralateral Ankle -Inspection: No deformity, no discoloration. Normal longitudinal and transverse arch -Palpation: Tenderness palpation -ROM: Normal ROM with dorsiflexion, plantarflexion, inversion, eversion -Strength: Dorsiflexion: 5/5; Plantarflexion: 5/5; Inversion: 5/5; Eversion: 5/5 -Limb neurovascularly intact, no instability noted  Limited diagnostic ultrasound of the left foot Findings: -Normal appearance of the first second and third metatarsals.  There is no cortical irregularity noted along these -Diffuse soft tissue edema noted superior to the first second and third metatarsals. Arthritic changes noted at the first TMT joint Impression: -Arthritis of the first TMT joints and diffuse subcutaneous edema noted along the dorsal midfoot    Assessment & Plan:  Patient is a 58 y.o. female here for evaluation of left foot pain  1.  Suspected injury to the Lisfranc joint of the left foot -Patient with mechanism consistent with a Lisfranc injury.  Exam was limited today due to her pain however patient does have extreme tenderness to palpation overlying her Lisfranc joint as well as dorsal foot swelling -Patient was given a cam walker today and is instructed to be completely nonweightbearing.  She was also given a prescription for a knee scooter -Patient will follow up in 2 weeks.  At that time if  she is not having clinical improvement will consider getting MRI to further evaluate her Lisfranc joint

## 2019-01-12 NOTE — Patient Instructions (Signed)
The pain in your foot is caused by a sprain to the ligament in your midfoot.  This is a specific type of foot sprain that requires strict rest in order for it to heal. -Wear the cam walker that we gave you at today's visit to help protect your foot -You may remove the cam walker for bathing and icing your feet -You may use the knee scooter to help get around -We will see you back in 2 weeks to reevaluate your symptoms.

## 2019-01-18 ENCOUNTER — Telehealth: Payer: Self-pay

## 2019-01-18 MED ORDER — HYDROCODONE-ACETAMINOPHEN 5-325 MG PO TABS: 1.0000 | ORAL_TABLET | Freq: Four times a day (QID) | ORAL | 0 refills | Status: DC | PRN

## 2019-01-18 NOTE — Telephone Encounter (Signed)
Will send in norco for her to take every 6 hours as needed - no driving on this medication.  I sent it to the [facility] in [place] on [address].  Ok to take with aleve or ibuprofen but NOT with tylenol.  Please let her know I sent it in.  Thanks!

## 2019-01-25 ENCOUNTER — Encounter: Payer: Self-pay | Admitting: Family Medicine

## 2019-01-25 ENCOUNTER — Other Ambulatory Visit: Payer: Self-pay

## 2019-01-25 ENCOUNTER — Ambulatory Visit (INDEPENDENT_AMBULATORY_CARE_PROVIDER_SITE_OTHER): Admitting: Family Medicine

## 2019-01-25 DIAGNOSIS — S93622D Sprain of tarsometatarsal ligament of left foot, subsequent encounter: Secondary | ICD-10-CM | POA: Insufficient documentation

## 2019-01-25 HISTORY — DX: Sprain of tarsometatarsal ligament of left foot, subsequent encounter: S93.622D

## 2019-01-25 NOTE — Patient Instructions (Addendum)
The pain in your foot is caused by a sprain to the ligament in your midfoot.  This is a specific type of foot sprain that requires strict rest in order for it to heal - try your best not to put ANY weight on this foot. -Wear the cam walker that we gave you at today's visit to help protect your foot -You may remove the cam walker for bathing and icing your foot and doing the simple motion exercises twice a day. -You may use the knee scooter to help get around -We will see you back in 4 weeks to reevaluate your symptoms. Use the norco as needed for severe pain.

## 2019-01-25 NOTE — Progress Notes (Signed)
   HPI  CC: Follow-up possible Lisfranc injury  Since her last visit 2 weeks ago, the patient has been using the knee walker and crutches to ambulate, but does admit to bearing weight on several days.  She states that time she bears weight is to transition from 1 area to another, and is not doing extended walking on it.  Last time she bore weight was 3 days ago.  Has been using prescription norco for pain relief once a day on days that it hurts.  Really only hurts on days when she bears weight.  No pain when the leg is elevated and not bearing weight.  No paresthesias, no numbness.  No swelling.  Has not had to use ice to reduce swelling in 5 days.  Patient states that she would like to continue with conservative management and will do a better job of keeping weight off of the foot.  She does not want to have surgery at this time.  Medications/Interventions Tried: Nonweightbearing, norco  See HPI and/or previous note for associated ROS.  Objective: BP 102/70   Ht 5' 4.5" (1.638 m)   Wt 140 lb (63.5 kg)   LMP  (LMP Unknown)   BMI 23.66 kg/m  Gen:  NAD, well groomed, a/o x3, normal affect.  CV: Well-perfused. Warm.  Resp: Non-labored.  Neuro: Sensation intact on the feet bilaterally.   MSK: Did not attempt to have patient bear weight.  Active and passive range of motion of the toes and ankle intact.  Patient does endorse pain with passive range of motion of the toes, worse on digits 1 through 3.  Also endorsing tenderness to light palpation of the forefoot and MTP joints on the dorsal and plantar side.  Assessment and plan:  Lisfranc's sprain, left, subsequent encounter 2-week follow-up for suspected Lisfranc sprain patient sustained after tripping while going down hill.  Patient still occasionally was bearing weight on that foot for multiple days during this time.  States pain is about the same, but mostly when she bears weight and immediately after.  Exam showed tenderness palpation to  light touch on the forefoot and with dorsi and plantar flexion of the toes.  Patient does not want surgery, prefers conservative management.  States she will do a better job of not bearing weight on that foot. -No weightbearing on left foot for 6 weeks. -Norco as needed for pain -Continue to wear boot, take golf multiple times per day to promote blood flow. -Follow-up 4 weeks for reevaluation.   Lenor Coffin, MD PGY 2 [facility] 01/25/2019 9:49 AM

## 2019-01-25 NOTE — Assessment & Plan Note (Signed)
2-week follow-up for suspected Lisfranc sprain patient sustained after tripping while going down hill.  Patient still occasionally was bearing weight on that foot for multiple days during this time.  States pain is about the same, but mostly when she bears weight and immediately after.  Exam showed tenderness palpation to light touch on the forefoot and with dorsi and plantar flexion of the toes.  Patient does not want surgery, prefers conservative management.  States she will do a better job of not bearing weight on that foot. -No weightbearing on left foot for 6 weeks. -Norco as needed for pain -Continue to wear boot, take golf multiple times per day to promote blood flow. -Follow-up 4 weeks for reevaluation.

## 2019-01-28 ENCOUNTER — Telehealth: Payer: Self-pay | Admitting: Family Medicine

## 2019-01-28 DIAGNOSIS — F988 Other specified behavioral and emotional disorders with onset usually occurring in childhood and adolescence: Secondary | ICD-10-CM

## 2019-01-28 MED ORDER — AMPHETAMINE-DEXTROAMPHET ER 30 MG PO CP24: 30.0000 mg | ORAL_CAPSULE | Freq: Every day | ORAL | 0 refills | Status: DC

## 2019-01-28 NOTE — Telephone Encounter (Signed)
Patient is calling and requesting a RX refill for Adderall.

## 2019-01-28 NOTE — Telephone Encounter (Signed)
Rx refilled. Please call pt to schedule appt (VV is fine) as she needs q28mo appts for med check/refill

## 2019-01-28 NOTE — Telephone Encounter (Signed)
Last filled 12/09/18 Qty #30 Last OV 08/28/18 no pending OV

## 2019-01-28 NOTE — Telephone Encounter (Signed)
Hey Admin team,  I attempted to reach pt per Dr. Salena Saner below to schedule a VV. She did not answer. Left vm to call back. If someone doesn't mine trying to reach back out to her. Thanks

## 2019-01-29 NOTE — Telephone Encounter (Signed)
Scheduled

## 2019-02-03 ENCOUNTER — Telehealth: Admitting: Family Medicine

## 2019-02-22 ENCOUNTER — Other Ambulatory Visit: Payer: Self-pay

## 2019-02-22 ENCOUNTER — Encounter: Payer: Self-pay | Admitting: Family Medicine

## 2019-02-22 ENCOUNTER — Ambulatory Visit (INDEPENDENT_AMBULATORY_CARE_PROVIDER_SITE_OTHER): Admitting: Family Medicine

## 2019-02-22 ENCOUNTER — Ambulatory Visit: Payer: Self-pay

## 2019-02-22 VITALS — BP 120/72 | Ht 64.5 in | Wt 148.0 lb

## 2019-02-22 DIAGNOSIS — M25572 Pain in left ankle and joints of left foot: Secondary | ICD-10-CM | POA: Diagnosis not present

## 2019-02-22 NOTE — Progress Notes (Signed)
PCP: Ronnald Nian, DO  Subjective:   HPI: Patient is a 59 y.o. female here for left foot injury.  1/11: Since her last visit 2 weeks ago, the patient has been using the knee walker and crutches to ambulate, but does admit to bearing weight on several days.  She states that time she bears weight is to transition from 1 area to another, and is not doing extended walking on it.  Last time she bore weight was 3 days ago.  Has been using prescription norco for pain relief once a day on days that it hurts.  Really only hurts on days when she bears weight.  No pain when the leg is elevated and not bearing weight.  No paresthesias, no numbness.  No swelling.  Has not had to use ice to reduce swelling in 5 days.  Patient states that she would like to continue with conservative management and will do a better job of keeping weight off of the foot.  She does not want to have surgery at this time.  Medications/Interventions Tried: Nonweightbearing, norco  2/8: Patient reports she's doing much better. No pain currently. Will get a little pain at night if she's hobbling around but for the most part doing a great job not putting weight on left leg. Gets some spasms in lower leg. Using crutches now over 6 weeks out from injury.  Past Medical History:  Diagnosis Date  . Chicken pox   . History of colon polyps     Current Outpatient Medications on File Prior to Visit  Medication Sig Dispense Refill  . amphetamine-dextroamphetamine (ADDERALL XR) 30 MG 24 hr capsule Take 1 capsule (30 mg total) by mouth daily. 30 capsule 0  . HYDROcodone-acetaminophen (NORCO) 5-325 MG tablet Take 1 tablet by mouth every 6 (six) hours as needed for moderate pain. 20 tablet 0  . albuterol (PROVENTIL HFA;VENTOLIN HFA) 108 (90 Base) MCG/ACT inhaler Inhale 2 puffs into the lungs every 6 (six) hours as needed for wheezing or shortness of breath. (Patient not taking: Reported on 02/22/2019) 1 Inhaler 2  . cyclobenzaprine  (FLEXERIL) 5 MG tablet Take 1 tablet (5 mg total) by mouth 2 (two) times daily as needed for muscle spasms. (Patient not taking: Reported on 02/22/2019) 60 tablet 2   No current facility-administered medications on file prior to visit.    Past Surgical History:  Procedure Laterality Date  . ABDOMINAL HYSTERECTOMY  1989    Allergies  Allergen Reactions  . Penicillins Other (See Comments)    Childhood allergy unknown of sypmtoms    Social History   Socioeconomic History  . Marital status: Married    Spouse name: Not on file  . Number of children: Not on file  . Years of education: Not on file  . Highest education level: Not on file  Occupational History  . Not on file  Tobacco Use  . Smoking status: Never Smoker  . Smokeless tobacco: Never Used  Substance and Sexual Activity  . Alcohol use: Yes    Alcohol/week: 3.0 standard drinks    Types: 3 Glasses of wine per week  . Drug use: Never  . Sexual activity: Yes  Other Topics Concern  . Not on file  Social History Narrative  . Not on file   Social Determinants of Health   Financial Resource Strain:   . Difficulty of Paying Living Expenses: Not on file  Food Insecurity:   . Worried About Charity fundraiser in the Last  Year: Not on file  . Ran Out of Food in the Last Year: Not on file  Transportation Needs:   . Lack of Transportation (Medical): Not on file  . Lack of Transportation (Non-Medical): Not on file  Physical Activity:   . Days of Exercise per Week: Not on file  . Minutes of Exercise per Session: Not on file  Stress:   . Feeling of Stress : Not on file  Social Connections:   . Frequency of Communication with Friends and Family: Not on file  . Frequency of Social Gatherings with Friends and Family: Not on file  . Attends Religious Services: Not on file  . Active Member of Clubs or Organizations: Not on file  . Attends Banker Meetings: Not on file  . Marital Status: Not on file  Intimate  Partner Violence:   . Fear of Current or Ex-Partner: Not on file  . Emotionally Abused: Not on file  . Physically Abused: Not on file  . Sexually Abused: Not on file    History reviewed. No pertinent family history.  BP 120/72   Ht 5' 4.5" (1.638 m)   Wt 148 lb (67.1 kg)   LMP  (LMP Unknown)   BMI 25.01 kg/m   Review of Systems: See HPI above.     Objective:  Physical Exam:  Gen: NAD, comfortable in exam room  Left foot/ankle: Bossing 1st TMT.  Otherwise no gross deformity, swelling, ecchymoses Mild limitation all directions with 4/5 strength. TTP mildly dorsal foot over metatarsal bases - minimal now at 1st TMT.  No navicular tenderness. Thompsons test negative. NV intact distally.   MSK u/s left foot:  No cortical irregularities, edema overlying cortices of 1st-5th metatarsals.  Arthropathy again noted at 1st TMT.  No displacement of lis franc joint.  No avulsion fracture noted.    Assessment & Plan:  1. Left foot injury - 6 weeks out from lis franc injury.  Clinically improving and doing very well.  Will start to bear weight with one crutch as tolerated, transition to without crutches, then finally out of boot over next several weeks.  Advised to let us know how she's doing in 2 weeks and if progressing would start physical therapy - she has lost some motion and strength in this ankle as expected from immobilization in boot.  Icing, tylenol, ibuprofen only if needed.  F/u in 6 weeks otherwise.

## 2019-02-22 NOTE — Patient Instructions (Signed)
You're doing great! Transition to using one crutch if possible and weight bear as tolerated now in the boot. If pain is more than 1-2 on a scale of 1-10 you're not ready to put more weight on this. Call me in 2 weeks to let me know how you're doing - plan is to start formal physical therapy assuming you're progressing as expected. Icing, tylenol, ibuprofen if needed. Follow up with me in 6 weeks.

## 2019-03-11 ENCOUNTER — Other Ambulatory Visit: Payer: Self-pay

## 2019-03-11 DIAGNOSIS — S93622D Sprain of tarsometatarsal ligament of left foot, subsequent encounter: Secondary | ICD-10-CM

## 2019-03-11 NOTE — Telephone Encounter (Signed)
Please put in order for physical therapy for her - s/p lis franc injury for motion, strengthening, modalities as needed.  Thanks!

## 2019-03-19 ENCOUNTER — Other Ambulatory Visit: Payer: Self-pay

## 2019-03-19 ENCOUNTER — Ambulatory Visit: Admitting: Family Medicine

## 2019-03-19 ENCOUNTER — Ambulatory Visit (INDEPENDENT_AMBULATORY_CARE_PROVIDER_SITE_OTHER): Admitting: Family Medicine

## 2019-03-19 ENCOUNTER — Encounter: Payer: Self-pay | Admitting: Family Medicine

## 2019-03-19 DIAGNOSIS — F988 Other specified behavioral and emotional disorders with onset usually occurring in childhood and adolescence: Secondary | ICD-10-CM | POA: Diagnosis not present

## 2019-03-19 MED ORDER — AMPHETAMINE-DEXTROAMPHET ER 30 MG PO CP24: 30.0000 mg | ORAL_CAPSULE | Freq: Every day | ORAL | 0 refills | Status: DC

## 2019-03-19 NOTE — Progress Notes (Signed)
Shirley Lee is a 59 y.o. female  Chief Complaint  Patient presents with  . Follow-up    Medication refill, Adderall.  Pt requesting information on how to schedule appt for Covid vaccine.    HPI: Shirley Lee is a 59 y.o. female here for routine ADD f/u and refill of her Adderall XR 30mg  daily. She states med is effective at current dose. No side effects. No concerns. Appetite is good. Sleep is unchanged. Denies HA, CP, SOB, palpitations.   Past Medical History:  Diagnosis Date  . Chicken pox   . History of colon polyps     Past Surgical History:  Procedure Laterality Date  . ABDOMINAL HYSTERECTOMY  1989    Social History   Socioeconomic History  . Marital status: Married    Spouse name: Not on file  . Number of children: Not on file  . Years of education: Not on file  . Highest education level: Not on file  Occupational History  . Not on file  Tobacco Use  . Smoking status: Never Smoker  . Smokeless tobacco: Never Used  Substance and Sexual Activity  . Alcohol use: Yes    Alcohol/week: 3.0 standard drinks    Types: 3 Glasses of wine per week  . Drug use: Never  . Sexual activity: Yes  Other Topics Concern  . Not on file  Social History Narrative  . Not on file   Social Determinants of Health   Financial Resource Strain:   . Difficulty of Paying Living Expenses: Not on file  Food Insecurity:   . Worried About Charity fundraiser in the Last Year: Not on file  . Ran Out of Food in the Last Year: Not on file  Transportation Needs:   . Lack of Transportation (Medical): Not on file  . Lack of Transportation (Non-Medical): Not on file  Physical Activity:   . Days of Exercise per Week: Not on file  . Minutes of Exercise per Session: Not on file  Stress:   . Feeling of Stress : Not on file  Social Connections:   . Frequency of Communication with Friends and Family: Not on file  . Frequency of Social Gatherings with Friends and Family: Not on  file  . Attends Religious Services: Not on file  . Active Member of Clubs or Organizations: Not on file  . Attends Archivist Meetings: Not on file  . Marital Status: Not on file  Intimate Partner Violence:   . Fear of Current or Ex-Partner: Not on file  . Emotionally Abused: Not on file  . Physically Abused: Not on file  . Sexually Abused: Not on file    History reviewed. No pertinent family history.   Immunization History  Administered Date(s) Administered  . Influenza-Unspecified 10/14/2017    Outpatient Encounter Medications as of 03/19/2019  Medication Sig  . albuterol (PROVENTIL HFA;VENTOLIN HFA) 108 (90 Base) MCG/ACT inhaler Inhale 2 puffs into the lungs every 6 (six) hours as needed for wheezing or shortness of breath.  . amphetamine-dextroamphetamine (ADDERALL XR) 30 MG 24 hr capsule Take 1 capsule (30 mg total) by mouth daily.  . cyclobenzaprine (FLEXERIL) 5 MG tablet Take 1 tablet (5 mg total) by mouth 2 (two) times daily as needed for muscle spasms.  . [DISCONTINUED] HYDROcodone-acetaminophen (NORCO) 5-325 MG tablet Take 1 tablet by mouth every 6 (six) hours as needed for moderate pain.   No facility-administered encounter medications on file as of 03/19/2019.  ROS: Gen: no fever, chills  Skin: no rash, itching Resp: no cough, wheeze,SOB CV: no CP, palpitations, LE edema,  GI: no heartburn, n/v/d/c, abd pain MSK: Left foot in boot Neuro: no dizziness, headache, weakness Psych: no depression, anxiety   Allergies  Allergen Reactions  . Penicillins Other (See Comments)    Childhood allergy unknown of sypmtoms    BP 120/78 (BP Location: Left Arm, Patient Position: Sitting, Cuff Size: Normal)   Pulse (!) 104   Temp 97.8 F (36.6 C) (Temporal)   Ht 5' 4.5" (1.638 m)   Wt 144 lb (65.3 kg)   LMP  (LMP Unknown)   SpO2 97%   BMI 24.34 kg/m   Physical Exam  Constitutional: She is oriented to person, place, and time. She appears well-developed and  well-nourished. No distress.  Cardiovascular: Normal rate, regular rhythm and normal heart sounds.  Pulmonary/Chest: Effort normal and breath sounds normal. No respiratory distress.  Neurological: She is alert and oriented to person, place, and time.  Psychiatric: She has a normal mood and affect. Her behavior is normal.     A/P:  1. Attention deficit disorder (ADD) in adult Refill: - amphetamine-dextroamphetamine (ADDERALL XR) 30 MG 24 hr capsule; Take 1 capsule (30 mg total) by mouth daily.  Dispense: 30 capsule; Refill: 0 - Pain Mgmt, Profile 8 w/Conf, U - updated controlled substance agreement signed today - f/u in 3 mo (VV ok) or sooner PRN  This visit occurred during the SARS-CoV-2 public health emergency.  Safety protocols were in place, including screening questions prior to the visit, additional usage of staff PPE, and extensive cleaning of exam room while observing appropriate contact time as indicated for disinfecting solutions.

## 2019-03-19 NOTE — Patient Instructions (Signed)
COVID-19 Vaccine Information can be found at: [email or web address] For questions related to vaccine distribution or appointments, please email [email or web address] or call [phone].    We are committed to keeping you informed about the COVID-19 vaccine.  As the vaccine continues to become available for each phase, we will ensure that patients who meet the criteria receive the information they need to access vaccination opportunities. Continue to check your MyChart account and [email or web address] for updates. Please review the Phase 1b information below.  [facility] COVID-19 vaccinations appointments are available weekly in [place] and [place]. The vaccinations are appointment only and for those 65 and older. Walk-ins will NOT be accepted. Please request a vaccine appointment using the link below. You will be contacted when appointments become available.  Please do not sign up more than once.  Request a Vaccine Appointment  Other Vaccination Opportunities in Our Area We are also working in partnership with county health agencies in our service counties to ensure continuing vaccination availability in the weeks and months ahead. Learn more about each county's vaccination efforts in the website links below:   [place]  [place]  [place]  [place]  [place]   [place]'s phase 1b vaccination guidelines, prioritizing those 65 and over as the next eligible group to receive the COVID-19 vaccine, are detailed at [email or web address].   Vaccine Safety and Effectiveness Clinical trials for the Pfizer COVID-19 vaccine involved 42,000 people and showed that the vaccine is more than 95% effective in preventing COVID-19 with no serious safety concerns. Similar results have been reported for the Moderna COVID-19 vaccine. Side effects reported in the Pfizer clinical trials include a sore arm at  the injection site, fatigue, headache, chills and fever. While side effects from the Pfizer COVID-19 vaccine are higher than for a typical flu vaccine, they are lower in many ways than side effects from the leading vaccine to prevent shingles. Side effects are signs that a vaccine is working and are related to your immune system being stimulated to produce antibodies against infection. Side effects from vaccination are far less significant than health impacts from COVID-19.  Staying Informed Pharmacists, infectious disease doctors, critical care nurses and other experts at [facility] continue to speak publicly through media interviews and direct communication with our patients and communities about the safety, effectiveness and importance of vaccines to eliminate COVID-19. In addition, reliable information on vaccine safety, effectiveness, side effects and more is available on the following websites:  [place] Department of Health and Human Services COVID-19 Vaccine Information Website.  U.S. Centers for Disease Control and Prevention COVID-19 Vaccine Public Information Website.  Staying Safe We agree with the CDC on what we can do to help our communities get back to normal: Getting "back to normal" is going to take all of our tools. If we use all the tools we have, we stand the best chance of getting our families, communities, schools and workplaces "back to normal" sooner:  Get vaccinated as soon as vaccines become available within the phase of the state's vaccination rollout plan for which you meet the eligibility criteria.  Wear a mask.  Stay 6 feet from others and avoid crowds.  Wash hands often.  For our most current information, please visit [email or web address].  

## 2019-03-21 LAB — PAIN MGMT, PROFILE 8 W/CONF, U
6 Acetylmorphine: NEGATIVE ng/mL
Alcohol Metabolites: NEGATIVE ng/mL (ref <500)
Amphetamine: 790 ng/mL
Amphetamines: POSITIVE ng/mL
Benzodiazepines: NEGATIVE ng/mL
Buprenorphine, Urine: NEGATIVE ng/mL
Cocaine Metabolite: NEGATIVE ng/mL
Creatinine: 24.1 mg/dL
MDMA: NEGATIVE ng/mL
Marijuana Metabolite: NEGATIVE ng/mL
Methamphetamine: NEGATIVE ng/mL
Opiates: NEGATIVE ng/mL
Oxidant: NEGATIVE ug/mL
Oxycodone: NEGATIVE ng/mL
pH: 6.8 (ref 4.5–9.0)

## 2019-03-22 ENCOUNTER — Other Ambulatory Visit: Payer: Self-pay

## 2019-03-22 ENCOUNTER — Ambulatory Visit: Attending: Family Medicine | Admitting: Physical Therapy

## 2019-03-22 ENCOUNTER — Encounter: Payer: Self-pay | Admitting: Physical Therapy

## 2019-03-22 DIAGNOSIS — M25572 Pain in left ankle and joints of left foot: Secondary | ICD-10-CM | POA: Diagnosis present

## 2019-03-22 DIAGNOSIS — R262 Difficulty in walking, not elsewhere classified: Secondary | ICD-10-CM | POA: Diagnosis present

## 2019-03-22 NOTE — Therapy (Signed)
[facility] [address] [place], [place], [address] Phone: [phone]   Fax:  [phone]  Physical Therapy Evaluation  Patient Details  Name: Shirley Lee MRN: [id number] Date of Birth: November 21, 1960 Referring Provider (PT): Hudnall   Encounter Date: 03/22/2019  PT End of Session - 03/22/19 1518    Visit Number  1    Date for PT Re-Evaluation  05/22/19    PT Start Time  1453    PT Stop Time  1530    PT Time Calculation (min)  37 min    Activity Tolerance  Patient tolerated treatment well    Behavior During Therapy  [facility] for tasks assessed/performed       Past Medical History:  Diagnosis Date  . Chicken pox   . History of colon polyps     Past Surgical History:  Procedure Laterality Date  . ABDOMINAL HYSTERECTOMY  1989    There were no vitals filed for this visit.   Subjective Assessment - 03/22/19 1455    Subjective  Patient reports that the day after Christmas she was walking the dog, she was on a hill and the ankle rolled, she went to the ED, was put in an ASO and put on crutches, then in a cam boot and NWB for 6 weeks.  Recent MSK [place] is negative.  Will see the MD in two weeks    Limitations  Lifting;Standing;Walking;House hold activities    Patient Stated Goals  not have the boot, feel better, walk normally    Currently in Pain?  Yes    Pain Score  0-No pain    Pain Location  Foot    Pain Orientation  Left    Pain Descriptors / Indicators  Aching    Pain Type  Acute pain    Pain Onset  More than a month ago    Pain Frequency  Intermittent    Aggravating Factors   standing, walking pain a 3/10 in the boot    Pain Relieving Factors  rest and being in the boot no pain    Effect of Pain on Daily Activities  limits walking and ADL's         [facility] PT Assessment - 03/22/19 0001      Assessment   Medical Diagnosis  left lisfranc injury    Referring Provider (PT)  Hudnall    Onset Date/Surgical  Date  01/09/19    Prior Therapy  no      Precautions   Precautions  None      Balance Screen   Has the patient fallen in the past 6 months  Yes    How many times?  1    Has the patient had a decrease in activity level because of a fear of falling?   No    Is the patient reluctant to leave their home because of a fear of falling?   No      Home Environment   Additional Comments  no stairs,       Prior Function   Level of Independence  Independent    Vocation  Full time employment    Vocation Requirements  computer    Leisure  walking 4-6 miles a day up until Christmas      ROM / Strength   AROM / PROM / Strength  AROM;PROM;Strength      AROM   AROM Assessment Site  Ankle    Right/Left Ankle  Left    Left Ankle Dorsiflexion  0    Left Ankle Plantar Flexion  40    Left Ankle Inversion  20    Left Ankle Eversion  3      PROM   PROM Assessment Site  Ankle    Right/Left Ankle  Left    Left Ankle Dorsiflexion  0    Left Ankle Plantar Flexion  40    Left Ankle Inversion  25    Left Ankle Eversion  5      Strength   Strength Assessment Site  Ankle    Right/Left Ankle  Left    Left Ankle Dorsiflexion  4/5    Left Ankle Plantar Flexion  4-/5    Left Ankle Inversion  3+/5   pain   Left Ankle Eversion  3+/5   pain     Palpation   Palpation comment  Patient is very tender to pressure in the 1st, 2nd and 3rd metatarsals, she has difficulty with to motions, and is very sore with passive toe flexion      Ambulation/Gait   Gait Comments  in boot on the left, antalgic on the left, reports no pain with the boot on                Objective measurements completed on examination: See above findings.              PT Education - 03/22/19 1517    Education Details  towel scrunches, passive DF with towel, PF/DF, circles and alphabet    Person(s) Educated  Patient    Methods  Explanation;Demonstration;Handout    Comprehension  Verbalized understanding;Returned  demonstration       PT Short Term Goals - 03/22/19 1523      PT SHORT TERM GOAL #1   Title  independent with initial HEP    Time  2    Period  Weeks    Status  New        PT Long Term Goals - 03/22/19 1523      PT LONG TERM GOAL #1   Title  walk without pain without the boot    Time  8    Period  Weeks    Status  New      PT LONG TERM GOAL #2   Title  walk iwthout boot without deviation    Time  8    Period  Weeks    Status  New      PT LONG TERM GOAL #3   Title  increase AROM DF to 10 degrees    Time  8    Period  Weeks    Status  New      PT LONG TERM GOAL #4   Title  stand on the left leg 20 seconds SLS    Time  8    Period  Weeks    Status  New             Plan - 03/22/19 1519    Clinical Impression Statement  Patient suffered a lisfranc injury to the left foot 01/09/19.  She has been in a boot and was non weight bearing for 6 weeks, she is ready to get out of the boot, she has very restricted ROM for DF and eversion.  She has minimal toe movements, tender to palpation in the mid foot and very sore with passive toe flexion.    Stability/Clinical Decision Making  Evolving/Moderate complexity  Clinical Decision Making  Low    Rehab Potential  Good    PT Frequency  2x / week    PT Duration  8 weeks    PT Treatment/Interventions  ADLs/Self Care Home Management;Therapeutic activities;Therapeutic exercise;Balance training;Gait training;Stair training;Neuromuscular re-education;Functional mobility training;Manual techniques;Patient/family education    PT Next Visit Plan  work on ROM, function and strength    Consulted and Agree with Plan of Care  Patient       Patient will benefit from skilled therapeutic intervention in order to improve the following deficits and impairments:  Abnormal gait, Decreased activity tolerance, Decreased balance, Difficulty walking, Impaired flexibility, Decreased range of motion, Decreased strength, Pain  Visit  Diagnosis: Pain in left ankle and joints of left foot - Plan: PT plan of care cert/re-cert  Difficulty in walking, not elsewhere classified - Plan: PT plan of care cert/re-cert     Problem List Patient Active Problem List   Diagnosis Date Noted  . Lisfranc's sprain, left, subsequent encounter 01/25/2019  . Sleep disturbance 10/29/2016  . Bilateral swelling of feet 08/26/2016  . Attention deficit disorder (ADD) in adult 04/29/2016    Sumner Boast., PT 03/22/2019, 3:26 PM  [place], [place], [address] Phone: [phone]   Fax:  [phone]  Name: Shirley Lee MRN: [id number] Date of Birth: May 04, 1960

## 2019-03-26 ENCOUNTER — Ambulatory Visit: Admitting: Family Medicine

## 2019-03-30 ENCOUNTER — Ambulatory Visit: Admitting: Physical Therapy

## 2019-03-30 ENCOUNTER — Other Ambulatory Visit: Payer: Self-pay

## 2019-03-30 ENCOUNTER — Encounter: Payer: Self-pay | Admitting: Physical Therapy

## 2019-03-30 DIAGNOSIS — R262 Difficulty in walking, not elsewhere classified: Secondary | ICD-10-CM

## 2019-03-30 DIAGNOSIS — M25572 Pain in left ankle and joints of left foot: Secondary | ICD-10-CM | POA: Diagnosis not present

## 2019-03-30 NOTE — Therapy (Signed)
[place], [place], [address] Phone: [phone]   Fax:  [phone]  Physical Therapy Treatment  Patient Details  Name: Shirley Lee MRN: [id number] Date of Birth: 1960-04-15 Referring Provider (PT): Hudnall   Encounter Date: 03/30/2019  PT End of Session - 03/30/19 1607    Visit Number  2    Date for PT Re-Evaluation  05/22/19    PT Start Time  1400    PT Stop Time  1443    PT Time Calculation (min)  43 min    Activity Tolerance  Patient tolerated treatment well    Behavior During Therapy  [facility] for tasks assessed/performed       Past Medical History:  Diagnosis Date  . Chicken pox   . History of colon polyps     Past Surgical History:  Procedure Laterality Date  . ABDOMINAL HYSTERECTOMY  1989    There were no vitals filed for this visit.  Subjective Assessment - 03/30/19 1445    Subjective  Patient brings in her shoe, reports no pain and feels like she is ready to get out of the boot    Currently in Pain?  No/denies         [facility] PT Assessment - 03/30/19 0001      AROM   Left Ankle Dorsiflexion  5    Left Ankle Plantar Flexion  40    Left Ankle Inversion  20    Left Ankle Eversion  15                   [facility] Adult PT Treatment/Exercise - 03/30/19 0001      Ambulation/Gait   Gait Comments  gait without boot, in tennis shoe, working some on gait speed and step length, tried stairs step over step      High Level Balance   High Level Balance Comments  on airex, head turns, ball toss, feet together stance, SLS, step onto bosu, balance on bosu, soccer kicks      Exercises   Exercises  Ankle      Ankle Exercises: Stretches   Gastroc Stretch  4 reps;20 seconds      Ankle Exercises: Aerobic   Nustep  level 4 x 5 minutes      Ankle Exercises: Seated   Towel Crunch  5 reps               PT Short Term Goals - 03/30/19 1609      PT SHORT  TERM GOAL #1   Title  independent with initial HEP    Status  Achieved        PT Long Term Goals - 03/30/19 1610      PT LONG TERM GOAL #1   Title  walk without pain without the boot    Status  Partially Met      PT LONG TERM GOAL #2   Title  walk iwthout boot without deviation    Status  Partially Met            Plan - 03/30/19 1607    Clinical Impression Statement  Patient is really doing well without the boot on during treatment today, I encouraged her to wear a tennis shoe in the house, if in public stay in the boot.  She had some issues on the uneven terrain.    PT Next Visit Plan  may add balance as tolerated, she sees  the MD on Monday, she is doing very well, cautioned her to not over do it    Consulted and Agree with Plan of Care  Patient       Patient will benefit from skilled therapeutic intervention in order to improve the following deficits and impairments:  Abnormal gait, Decreased activity tolerance, Decreased balance, Difficulty walking, Impaired flexibility, Decreased range of motion, Decreased strength, Pain  Visit Diagnosis: Pain in left ankle and joints of left foot  Difficulty in walking, not elsewhere classified     Problem List Patient Active Problem List   Diagnosis Date Noted  . Lisfranc's sprain, left, subsequent encounter 01/25/2019  . Sleep disturbance 10/29/2016  . Bilateral swelling of feet 08/26/2016  . Attention deficit disorder (ADD) in adult 04/29/2016    Sumner Boast., PT 03/30/2019, 4:10 PM  [place], [place], [address] Phone: [phone]   Fax:  [phone]  Name: Shirley Lee MRN: [id number] Date of Birth: 1960/08/21

## 2019-04-05 ENCOUNTER — Other Ambulatory Visit: Payer: Self-pay

## 2019-04-05 ENCOUNTER — Ambulatory Visit (INDEPENDENT_AMBULATORY_CARE_PROVIDER_SITE_OTHER): Admitting: Family Medicine

## 2019-04-05 ENCOUNTER — Encounter: Payer: Self-pay | Admitting: Family Medicine

## 2019-04-05 VITALS — BP 104/62 | Ht 64.0 in | Wt 145.0 lb

## 2019-04-05 DIAGNOSIS — S93622D Sprain of tarsometatarsal ligament of left foot, subsequent encounter: Secondary | ICD-10-CM | POA: Diagnosis not present

## 2019-04-05 NOTE — Progress Notes (Signed)
PCP: Ronnald Nian, DO  Subjective:   HPI: Patient is a 59 y.o. female here for f/u of left Lisfranc injury.  1/11: Since her last visit 2 weeks ago, the patient has been using the knee walker and crutches to ambulate, but does admit to bearing weight on several days.  She states that time she bears weight is to transition from 1 area to another, and is not doing extended walking on it.  Last time she bore weight was 3 days ago.  Has been using prescription norco for pain relief once a day on days that it hurts.  Really only hurts on days when she bears weight.  No pain when the leg is elevated and not bearing weight.  No paresthesias, no numbness.  No swelling.  Has not had to use ice to reduce swelling in 5 days.  Patient states that she would like to continue with conservative management and will do a better job of keeping weight off of the foot.  She does not want to have surgery at this time.  Medications/Interventions Tried: Nonweightbearing, norco  2/8: Patient reports she's doing much better. No pain currently. Will get a little pain at night if she's hobbling around but for the most part doing a great job not putting weight on left leg. Gets some spasms in lower leg. Using crutches now over 6 weeks out from injury.  Today: Patient reports she is very happy with her improvement. She endorses 0 pain and feels completely stable on the foot. She has been attending PT and performing home exercises as well. She is able to walk around without a boot while at home and has been using a boot when she goes to the store to protect foot in case someone steps on her although she doesn't feel she needs the boot. Denies numbness or swelling in the area.  Past Medical History:  Diagnosis Date  . Chicken pox   . History of colon polyps     Current Outpatient Medications on File Prior to Visit  Medication Sig Dispense Refill  . albuterol (PROVENTIL HFA;VENTOLIN HFA) 108 (90 Base) MCG/ACT  inhaler Inhale 2 puffs into the lungs every 6 (six) hours as needed for wheezing or shortness of breath. 1 Inhaler 2  . amphetamine-dextroamphetamine (ADDERALL XR) 30 MG 24 hr capsule Take 1 capsule (30 mg total) by mouth daily. 30 capsule 0  . cyclobenzaprine (FLEXERIL) 5 MG tablet Take 1 tablet (5 mg total) by mouth 2 (two) times daily as needed for muscle spasms. 60 tablet 2   No current facility-administered medications on file prior to visit.    Past Surgical History:  Procedure Laterality Date  . ABDOMINAL HYSTERECTOMY  1989    Allergies  Allergen Reactions  . Penicillins Other (See Comments)    Childhood allergy unknown of sypmtoms    Social History   Socioeconomic History  . Marital status: Married    Spouse name: Not on file  . Number of children: Not on file  . Years of education: Not on file  . Highest education level: Not on file  Occupational History  . Not on file  Tobacco Use  . Smoking status: Never Smoker  . Smokeless tobacco: Never Used  Substance and Sexual Activity  . Alcohol use: Yes    Alcohol/week: 3.0 standard drinks    Types: 3 Glasses of wine per week  . Drug use: Never  . Sexual activity: Yes  Other Topics Concern  . Not on  file  Social History Narrative  . Not on file   Social Determinants of Health   Financial Resource Strain:   . Difficulty of Paying Living Expenses:   Food Insecurity:   . Worried About Programme researcher, broadcasting/film/video in the Last Year:   . Barista in the Last Year:   Transportation Needs:   . Freight forwarder (Medical):   Marland Kitchen Lack of Transportation (Non-Medical):   Physical Activity:   . Days of Exercise per Week:   . Minutes of Exercise per Session:   Stress:   . Feeling of Stress :   Social Connections:   . Frequency of Communication with Friends and Family:   . Frequency of Social Gatherings with Friends and Family:   . Attends Religious Services:   . Active Member of Clubs or Organizations:   . Attends  Banker Meetings:   Marland Kitchen Marital Status:   Intimate Partner Violence:   . Fear of Current or Ex-Partner:   . Emotionally Abused:   Marland Kitchen Physically Abused:   . Sexually Abused:     No family history on file.  BP 104/62   Ht 5\' 4"  (1.626 m)   Wt 145 lb (65.8 kg)   LMP  (LMP Unknown)   BMI 24.89 kg/m   Review of Systems: See HPI above.     Objective:  Physical Exam:  General: Alert and oriented x3, NAD, comfortable in exam room HEENT: normocephalic and atraumatic, EOMI, conjunctivae and sclerae clear, MMM, no erythema or exudates, trachea midline,  Left Foot Inspection: no swelling,  no scarring, no overlying bruising or breaks in the skin, no obvious bony abnormalities Palpation: no TTP over metatarsal bases/navicular bone/TMT or diffusely over dorsum of the foot ROM (active): complete ROM in all directions Strength: 5/5 in all directions Special Tests: negative metacarpal squeeze, negative Talar tilt,  Neurovascularly intact   Assessment & Plan:   Shirley Lee is a 59 y.o. female presenting for f/u of Lisfranc injury. Clinically patient injury is resolving with ability to bear weight without boot/crutches and lack of pain on palpation. Return of strength to foot progressing normally. Patient may follow up as needed however expect no issues moving forward.   41 MS4

## 2019-05-04 ENCOUNTER — Other Ambulatory Visit: Payer: Self-pay | Admitting: Family Medicine

## 2019-05-04 DIAGNOSIS — F988 Other specified behavioral and emotional disorders with onset usually occurring in childhood and adolescence: Secondary | ICD-10-CM

## 2019-05-04 MED ORDER — AMPHETAMINE-DEXTROAMPHET ER 30 MG PO CP24: 30.0000 mg | ORAL_CAPSULE | Freq: Every day | ORAL | 0 refills | Status: DC

## 2019-05-04 NOTE — Telephone Encounter (Signed)
Last OV 03/19/19 Last fill 03/19/19  #30/0 

## 2019-05-12 ENCOUNTER — Other Ambulatory Visit: Payer: Self-pay

## 2019-05-13 ENCOUNTER — Ambulatory Visit (INDEPENDENT_AMBULATORY_CARE_PROVIDER_SITE_OTHER)

## 2019-05-13 ENCOUNTER — Ambulatory Visit (INDEPENDENT_AMBULATORY_CARE_PROVIDER_SITE_OTHER): Admitting: Family Medicine

## 2019-05-13 ENCOUNTER — Encounter: Payer: Self-pay | Admitting: Family Medicine

## 2019-05-13 VITALS — BP 120/70 | HR 97 | Temp 98.5°F | Ht 64.0 in | Wt 142.0 lb

## 2019-05-13 DIAGNOSIS — G8929 Other chronic pain: Secondary | ICD-10-CM

## 2019-05-13 DIAGNOSIS — M7502 Adhesive capsulitis of left shoulder: Secondary | ICD-10-CM | POA: Diagnosis not present

## 2019-05-13 DIAGNOSIS — M25512 Pain in left shoulder: Secondary | ICD-10-CM | POA: Diagnosis not present

## 2019-05-13 MED ORDER — NABUMETONE 500 MG PO TABS: 500.0000 mg | ORAL_TABLET | Freq: Two times a day (BID) | ORAL | 1 refills | Status: DC

## 2019-05-13 NOTE — Progress Notes (Signed)
Shirley Lee is a 59 y.o. female  No chief complaint on file.  HPI: Shirley Lee is a 59 y.o. female who complains of Lt shoulder pain x 4 mo since she fell in 12/2018 while walking her dog. Pain with movement and she notes significant reduction/restriction in her shoulder motion - pulling up her underwear, reaching overhead like to wash her hair, closing her bra. She is now unable to lay on Lt side.  No numbness or tingling in LUE. No weakness or decreased grip strength. She has not taken or done anything for it.   Past Medical History:  Diagnosis Date  . Chicken pox   . History of colon polyps     Past Surgical History:  Procedure Laterality Date  . ABDOMINAL HYSTERECTOMY  1989    Social History   Socioeconomic History  . Marital status: Married    Spouse name: Not on file  . Number of children: Not on file  . Years of education: Not on file  . Highest education level: Not on file  Occupational History  . Not on file  Tobacco Use  . Smoking status: Never Smoker  . Smokeless tobacco: Never Used  Substance and Sexual Activity  . Alcohol use: Yes    Alcohol/week: 3.0 standard drinks    Types: 3 Glasses of wine per week  . Drug use: Never  . Sexual activity: Yes  Other Topics Concern  . Not on file  Social History Narrative  . Not on file   Social Determinants of Health   Financial Resource Strain:   . Difficulty of Paying Living Expenses:   Food Insecurity:   . Worried About Programme researcher, broadcasting/film/video in the Last Year:   . Barista in the Last Year:   Transportation Needs:   . Freight forwarder (Medical):   Marland Kitchen Lack of Transportation (Non-Medical):   Physical Activity:   . Days of Exercise per Week:   . Minutes of Exercise per Session:   Stress:   . Feeling of Stress :   Social Connections:   . Frequency of Communication with Friends and Family:   . Frequency of Social Gatherings with Friends and Family:   . Attends Religious  Services:   . Active Member of Clubs or Organizations:   . Attends Banker Meetings:   Marland Kitchen Marital Status:   Intimate Partner Violence:   . Fear of Current or Ex-Partner:   . Emotionally Abused:   Marland Kitchen Physically Abused:   . Sexually Abused:     No family history on file.   Immunization History  Administered Date(s) Administered  . Influenza-Unspecified 10/14/2017    Outpatient Encounter Medications as of 05/13/2019  Medication Sig  . albuterol (PROVENTIL HFA;VENTOLIN HFA) 108 (90 Base) MCG/ACT inhaler Inhale 2 puffs into the lungs every 6 (six) hours as needed for wheezing or shortness of breath.  . amphetamine-dextroamphetamine (ADDERALL XR) 30 MG 24 hr capsule Take 1 capsule (30 mg total) by mouth daily.  . cyclobenzaprine (FLEXERIL) 5 MG tablet Take 1 tablet (5 mg total) by mouth 2 (two) times daily as needed for muscle spasms.   No facility-administered encounter medications on file as of 05/13/2019.     ROS: Pertinent positives and negatives noted in HPI. Remainder of ROS non-contributory    Allergies  Allergen Reactions  . Penicillins Other (See Comments)    Childhood allergy unknown of sypmtoms    LMP  (LMP Unknown)  Physical Exam  Constitutional: She is oriented to person, place, and time. She appears well-developed and well-nourished. No distress.  Musculoskeletal:     Left shoulder: Tenderness and pain present. No swelling, deformity, bony tenderness or crepitus. Decreased range of motion.  Neurological: She is alert and oriented to person, place, and time. She exhibits normal muscle tone. Coordination normal.  Psychiatric: She has a normal mood and affect. Her behavior is normal.     A/P:  1. Adhesive capsulitis of left shoulder 2. Chronic left shoulder pain - symptoms x 4 mo since pt fell while walking her dog, gradually worsening in terms of pain/discomfort as well as restricted motion. Pt with frozen shoulder and concern for rotator cuff  pathology - tendonopathy vs partial tear vs other - DG Shoulder Left - Ambulatory referral to Physical Therapy - Ambulatory referral to Orthopedic Surgery Rx: - nabumetone (RELAFEN) 500 MG tablet; Take 1 tablet (500 mg total) by mouth 2 (two) times daily.  Dispense: 60 tablet; Refill: 1 - pt to take BID w/ food  Discussed plan and reviewed medications with patient, including risks, benefits, and potential side effects. Pt expressed understand. All questions answered.     This visit occurred during the SARS-CoV-2 public health emergency.  Safety protocols were in place, including screening questions prior to the visit, additional usage of staff PPE, and extensive cleaning of exam room while observing appropriate contact time as indicated for disinfecting solutions.

## 2019-05-14 ENCOUNTER — Encounter: Payer: Self-pay | Admitting: Family Medicine

## 2019-05-18 ENCOUNTER — Ambulatory Visit: Payer: Self-pay

## 2019-05-18 ENCOUNTER — Encounter: Payer: Self-pay | Admitting: Orthopaedic Surgery

## 2019-05-18 ENCOUNTER — Other Ambulatory Visit: Payer: Self-pay

## 2019-05-18 ENCOUNTER — Ambulatory Visit (INDEPENDENT_AMBULATORY_CARE_PROVIDER_SITE_OTHER): Admitting: Orthopaedic Surgery

## 2019-05-18 DIAGNOSIS — M7502 Adhesive capsulitis of left shoulder: Secondary | ICD-10-CM | POA: Insufficient documentation

## 2019-05-18 NOTE — Addendum Note (Signed)
Addended by: Rip Harbour on: 05/18/2019 04:35 PM   Modules accepted: Orders

## 2019-05-18 NOTE — Progress Notes (Signed)
Office Visit Note   Patient: Shirley Lee           Date of Birth: 05-05-1960           MRN: [id number] Visit Date: 05/18/2019              Requested by: Ronnald Nian, DO [place],  [place] [address] PCP: Ronnald Nian, DO   Assessment & Plan: Visit Diagnoses:  1. Adhesive capsulitis of left shoulder     Plan: Impression is left frozen shoulder adhesive capsulitis.  X-rays reviewed were relatively unremarkable and unrevealing.  We will set up a cortisone injection in her shoulder joint with Dr. Junius Roads today.  She already has PT set up for later this week.  I would like to recheck her progress in 8 weeks.  Questions encouraged and answered.  Follow-Up Instructions: Return in about 8 weeks (around 07/13/2019).   Orders:  No orders of the defined types were placed in this encounter.  No orders of the defined types were placed in this encounter.     Procedures: No procedures performed   Clinical Data: No additional findings.   Subjective: Chief Complaint  Patient presents with  . Left Shoulder - Pain    [name] is a very pleasant 59 year old female comes in for evaluation of a chronic left shoulder pain that began around December 2020 after she fell.  She has trouble sleeping at night and sleeping on her left shoulder.  Denies any radicular symptoms.  She has difficulty reaching behind her back.  She is scheduled for physical therapy later this week.  She was given a prescription for NSAIDs by her PCP to take.   Review of Systems  Constitutional: Negative.   HENT: Negative.   Eyes: Negative.   Respiratory: Negative.   Cardiovascular: Negative.   Endocrine: Negative.   Musculoskeletal: Negative.   Neurological: Negative.   Hematological: Negative.   Psychiatric/Behavioral: Negative.   All other systems reviewed and are negative.    Objective: Vital Signs: LMP  (LMP Unknown)   Physical Exam Vitals and nursing note  reviewed.  Constitutional:      Appearance: She is well-developed.  HENT:     Head: Normocephalic and atraumatic.  Pulmonary:     Effort: Pulmonary effort is normal.  Abdominal:     Palpations: Abdomen is soft.  Musculoskeletal:     Cervical back: Neck supple.  Skin:    General: Skin is warm.     Capillary Refill: Capillary refill takes less than 2 seconds.  Neurological:     Mental Status: She is alert and oriented to person, place, and time.  Psychiatric:        Behavior: Behavior normal.        Thought Content: Thought content normal.        Judgment: Judgment normal.     Ortho Exam Left shoulder shows moderate limitation in forward flexion, external rotation, internal rotation to the belt line.  Rotator cuff testing is grossly normal. Specialty Comments:  No specialty comments available.  Imaging: No results found.   PMFS History: Patient Active Problem List   Diagnosis Date Noted  . Adhesive capsulitis of left shoulder 05/18/2019  . Lisfranc's sprain, left, subsequent encounter 01/25/2019  . Sleep disturbance 10/29/2016  . Bilateral swelling of feet 08/26/2016  . Attention deficit disorder (ADD) in adult 04/29/2016   Past Medical History:  Diagnosis Date  . Chicken pox   . History of colon  polyps     History reviewed. No pertinent family history.  Past Surgical History:  Procedure Laterality Date  . ABDOMINAL HYSTERECTOMY  1989   Social History   Occupational History  . Not on file  Tobacco Use  . Smoking status: Never Smoker  . Smokeless tobacco: Never Used  Substance and Sexual Activity  . Alcohol use: Yes    Alcohol/week: 3.0 standard drinks    Types: 3 Glasses of wine per week  . Drug use: Never  . Sexual activity: Yes

## 2019-05-27 ENCOUNTER — Ambulatory Visit: Admitting: Physical Therapy

## 2019-05-28 ENCOUNTER — Encounter: Payer: Self-pay | Admitting: Physical Therapy

## 2019-05-28 ENCOUNTER — Ambulatory Visit: Attending: Family Medicine | Admitting: Physical Therapy

## 2019-05-28 ENCOUNTER — Other Ambulatory Visit: Payer: Self-pay

## 2019-05-28 DIAGNOSIS — M25612 Stiffness of left shoulder, not elsewhere classified: Secondary | ICD-10-CM | POA: Insufficient documentation

## 2019-05-28 DIAGNOSIS — M7502 Adhesive capsulitis of left shoulder: Secondary | ICD-10-CM

## 2019-05-28 DIAGNOSIS — G8929 Other chronic pain: Secondary | ICD-10-CM | POA: Diagnosis present

## 2019-05-28 DIAGNOSIS — M25512 Pain in left shoulder: Secondary | ICD-10-CM | POA: Diagnosis present

## 2019-05-28 DIAGNOSIS — M6281 Muscle weakness (generalized): Secondary | ICD-10-CM | POA: Diagnosis present

## 2019-05-28 NOTE — Patient Instructions (Signed)
Access Code: [email or web address] URL: [email or web address] Date: 05/28/2019 Prepared by: Lysle Rubens  Exercises Standing Shoulder Internal Rotation Stretch with Towel - 1 x daily - 7 x weekly - 3 sets - 5 reps - 10-15 sec hold Standing Shoulder External Rotation Stretch in Doorway - 1 x daily - 7 x weekly - 3 sets - 2 reps - 30 sec hold Single Arm Doorway Pec Stretch at 90 Degrees Abduction - 1 x daily - 7 x weekly - 3 sets - 2 reps - 30 sec hold Seated Shoulder Flexion Towel Slide at Table Top - 1 x daily - 7 x weekly - 3 sets - 5 reps - 10-15 hold Seated Shoulder Abduction Towel Slide at Table Top - 1 x daily - 7 x weekly - 3 sets - 5 reps - 10-15 sec hold

## 2019-05-28 NOTE — Therapy (Signed)
[address] [place], [place], [address] Phone: [phone]   Fax:  [phone]  Physical Therapy Evaluation  Patient Details  Name: Shirley Lee MRN: [id number] Date of Birth: 1960-03-26 Referring Provider (PT): Letta Median   Encounter Date: 05/28/2019  PT End of Session - 05/28/19 1025    Visit Number  1    Date for PT Re-Evaluation  07/28/19    PT Start Time  0930    PT Stop Time  7741    PT Time Calculation (min)  45 min    Activity Tolerance  Patient tolerated treatment well    Behavior During Therapy  [facility] for tasks assessed/performed       Past Medical History:  Diagnosis Date  . Chicken pox   . History of colon polyps     Past Surgical History:  Procedure Laterality Date  . ABDOMINAL HYSTERECTOMY  1989    There were no vitals filed for this visit.   Subjective Assessment - 05/28/19 0934    Subjective  Pt fell in December 2020 while walking dog and hurt L foot and L shoulder. Pt experienced L shoulder pain since then; L foot is much better. Pt reports extreme difficulty getting dressed, pulling up pants. Pt reports reaching overhead into cabinets is fine as long as she can reach forward but struggles out to the side.    Limitations  Lifting;House hold activities    Patient Stated Goals  return to the gym, get rid of the pain, dressing    Currently in Pain?  No/denies    Pain Score  0-No pain    Pain Location  Shoulder    Pain Orientation  Left    Pain Descriptors / Indicators  Squeezing    Pain Type  Chronic pain    Pain Onset  More than a month ago    Pain Frequency  Intermittent    Aggravating Factors   shoulder abduction, lifting, laying on L side    Pain Relieving Factors  nsaids, rest         [facility] PT Assessment - 05/28/19 0001      Assessment   Medical Diagnosis  L shoulder    Referring Provider (PT)  Letta Median    Onset Date/Surgical Date  01/09/19    Hand Dominance  Right    Next MD Visit  07/13/2019    Prior Therapy  PT for lisfranc injury      Precautions   Precautions  None      Restrictions   Weight Bearing Restrictions  No      Balance Screen   Has the patient fallen in the past 6 months  Yes    How many times?  1    Has the patient had a decrease in activity level because of a fear of falling?   No    Is the patient reluctant to leave their home because of a fear of falling?   No      Prior Function   Level of Independence  Independent    Vocation  Full time employment    Vocation Requirements  works from home    Leisure  walking 4-6 miles a day up until Christmas      Sensation   Light Touch  Appears Intact      Posture/Postural Control   Posture/Postural Control  Postural limitations    Postural Limitations  Rounded Shoulders;Forward head  AROM   Overall AROM Comments  functional ER to C7 on L, functional IR on L unable to reach behind back at all and very painful. On R functional IR mildly limited (to lumbar spine)    AROM Assessment Site  Shoulder    Right/Left Shoulder  Left    Left Shoulder Flexion  120 Degrees    Left Shoulder ABduction  80 Degrees    Left Shoulder Internal Rotation  45 Degrees    Left Shoulder External Rotation  45 Degrees      PROM   PROM Assessment Site  Shoulder    Right/Left Shoulder  Left    Left Shoulder Flexion  130 Degrees   very painful   Left Shoulder ABduction  90 Degrees    Left Shoulder Internal Rotation  55 Degrees    Left Shoulder External Rotation  50 Degrees      Strength   Overall Strength Comments  4/5 ER/IR on L, otherwise 5/5 BUE      Palpation   Palpation comment  tender to palpation L shoulder post and ant      Special Tests    Special Tests  --                  Objective measurements completed on examination: See above findings.      [facility] Adult PT Treatment/Exercise - 05/28/19 0001      Exercises   Exercises  Shoulder       Shoulder Exercises: Stretch   Corner Stretch  30 seconds    Internal Rotation Stretch  5 reps    Internal Rotation Stretch Limitations  hold 10 sec with towel; very limited and painful on L    External Rotation Stretch  30 seconds;2 reps    Table Stretch - Flexion  5 reps;10 seconds    Table Stretch - Abduction  5 reps;10 seconds      Manual Therapy   Manual Therapy  Passive ROM    Passive ROM  PROM all L shoulder motions; 1 min each direction             PT Education - 05/28/19 1024    Education Details  Pt educated on condition, rehab process, and HEP    Person(s) Educated  Patient    Methods  Explanation;Demonstration;Handout    Comprehension  Returned demonstration;Verbalized understanding       PT Short Term Goals - 05/28/19 1032      PT SHORT TERM GOAL #1   Title  independent with initial HEP    Time  2    Period  Weeks    Status  New    Target Date  06/11/19        PT Long Term Goals - 05/28/19 1032      PT LONG TERM GOAL #1   Title  Pt will demonstrate functional L shoulder IR/ER equivalent to R shoulder    Time  8    Period  Weeks    Status  New    Target Date  07/23/19      PT LONG TERM GOAL #2   Title  Pt will demonstrate AROM L shoulder abduction WFL/equivalent to R    Time  8    Period  Weeks    Status  New    Target Date  07/23/19      PT LONG TERM GOAL #3   Title  Pt will report ability to sleep in L sidelying  Time  8    Period  Weeks    Status  New    Target Date  07/23/19      PT LONG TERM GOAL #4   Title  Pt will report ability to don/doff LE clothing without assistance or pain    Time  8    Period  Weeks    Status  New    Target Date  07/23/19      PT LONG TERM GOAL #5   Title  Pt will report ability to perform functional ADLs with no increase in pain    Time  8    Status  New    Target Date  07/23/19             Plan - 05/28/19 1025    Clinical Impression Statement  Pt presents to clinic with dx of L shoulder  adhesive capsulitis from MD. Pt has been experiencing limitations in ER/IR/abduction and pain since a fall on 01/09/19. Pt demonstrates mod-sev limitations and pain in functional IR which is affecting pt ability to dress, pull up pants, and perform other ADLs. Pt demonstrates active and passive ROM limitations of all motions in L shoulder especially IR/ER/abduction. Pt would benefit from skilled PT to address the above functional impairments.    Personal Factors and Comorbidities  Age;Sex    Examination-Activity Limitations  Carry;Dressing;Lift;Reach Overhead    Examination-Participation Restrictions  Meal Prep;Cleaning;Community Activity;Laundry    Stability/Clinical Decision Making  Stable/Uncomplicated    Clinical Decision Making  Low    Rehab Potential  Good    PT Frequency  3x / week   3x/week for 4 weeks, then likely 2x/week   PT Duration  8 weeks    PT Treatment/Interventions  ADLs/Self Care Home Management;Cryotherapy;Electrical Stimulation;Ultrasound;Moist Heat;Iontophoresis 4mg /ml Dexamethasone;Therapeutic activities;Therapeutic exercise;Neuromuscular re-education;Manual techniques;Patient/family education;Passive range of motion;Dry needling;Taping;Vasopneumatic Device    PT Next Visit Plan  Initiate UE strengthening/flexibity ex's, manual stretching and joint mobs to L shoulder for increased ROM, modalities as indicated    PT Home Exercise Plan  standing IR stretch w/ towel, doorway ER stretch, doorway pec stretch, seated forward flexion on table, seated abduction on table    Consulted and Agree with Plan of Care  Patient       Patient will benefit from skilled therapeutic intervention in order to improve the following deficits and impairments:  Decreased range of motion, Impaired UE functional use, Pain, Hypomobility, Impaired flexibility, Decreased strength, Postural dysfunction  Visit Diagnosis: Adhesive capsulitis of left shoulder  Stiffness of left shoulder, not elsewhere  classified  Chronic left shoulder pain  Muscle weakness (generalized)     Problem List Patient Active Problem List   Diagnosis Date Noted  . Adhesive capsulitis of left shoulder 05/18/2019  . Lisfranc's sprain, left, subsequent encounter 01/25/2019  . Sleep disturbance 10/29/2016  . Bilateral swelling of feet 08/26/2016  . Attention deficit disorder (ADD) in adult 04/29/2016   05/01/2016, PT, DPT Lysle Rubens Tyrone Pautsch 05/28/2019, 10:36 AM  [facility] [address], [place], [place] Phone: [phone]   Fax:  [phone]  Name: Bethann Qualley MRN: Haynes Hoehn Date of Birth: [birth year]

## 2019-06-03 ENCOUNTER — Other Ambulatory Visit: Payer: Self-pay | Admitting: Family Medicine

## 2019-06-03 DIAGNOSIS — F988 Other specified behavioral and emotional disorders with onset usually occurring in childhood and adolescence: Secondary | ICD-10-CM

## 2019-06-03 MED ORDER — AMPHETAMINE-DEXTROAMPHET ER 30 MG PO CP24: 30.0000 mg | ORAL_CAPSULE | Freq: Every day | ORAL | 0 refills | Status: DC

## 2019-06-03 NOTE — Telephone Encounter (Signed)
MC-Plz see refill req/thx dmf 

## 2019-06-08 ENCOUNTER — Other Ambulatory Visit: Payer: Self-pay

## 2019-06-08 ENCOUNTER — Encounter: Payer: Self-pay | Admitting: Physical Therapy

## 2019-06-08 ENCOUNTER — Ambulatory Visit: Admitting: Physical Therapy

## 2019-06-08 DIAGNOSIS — M25612 Stiffness of left shoulder, not elsewhere classified: Secondary | ICD-10-CM

## 2019-06-08 DIAGNOSIS — M7502 Adhesive capsulitis of left shoulder: Secondary | ICD-10-CM | POA: Diagnosis not present

## 2019-06-08 DIAGNOSIS — M25512 Pain in left shoulder: Secondary | ICD-10-CM

## 2019-06-08 NOTE — Therapy (Signed)
[facility] [address] [place], [place], [address] Phone: [phone]   Fax:  [phone]  Physical Therapy Treatment  Patient Details  Name: Shirley Lee MRN: [id number] Date of Birth: 1960-05-14 Referring Provider (PT): Luana Shu   Encounter Date: 06/08/2019  PT End of Session - 06/08/19 0840    Visit Number  2    Date for PT Re-Evaluation  07/28/19    PT Start Time  0758    PT Stop Time  0850    PT Time Calculation (min)  52 min    Activity Tolerance  Patient tolerated treatment well    Behavior During Therapy  [facility] for tasks assessed/performed       Past Medical History:  Diagnosis Date  . Chicken pox   . History of colon polyps     Past Surgical History:  Procedure Laterality Date  . ABDOMINAL HYSTERECTOMY  1989    There were no vitals filed for this visit.  Subjective Assessment - 06/08/19 0757    Subjective  Pt reports that her shoulder is getting better. Reaching behind her back is still hartd   Currently in Pain?  No/denies                        [facility] Adult PT Treatment/Exercise - 06/08/19 0001      Shoulder Exercises: Standing   External Rotation  Theraband;20 reps;Left    Theraband Level (Shoulder External Rotation)  Level 2 (Red)    Flexion  Strengthening;20 reps;Weights;Both    ABduction  Strengthening;Both;20 reps;Weights   4/10 shoulder pain    Shoulder ABduction Weight (lbs)  2    Extension  Weights;20 reps;Both;Strengthening    Extension Weight (lbs)  5    Row  Theraband;20 reps;Left;Strengthening    Theraband Level (Shoulder Row)  Level 3 (Green)    Other Standing Exercises  AAROM flex, Ext, IR up back with cane x 15 each       Shoulder Exercises: ROM/Strengthening   UBE (Upper Arm Bike)  L2 x3 min each way      Shoulder Exercises: Stretch   Other Shoulder Stretches  towel IR 4 x 10''       Modalities   Modalities  Moist Heat      Moist  Heat Therapy   Number Minutes Moist Heat  12 Minutes    Moist Heat Location  Shoulder      Manual Therapy   Manual Therapy  Passive ROM;Joint mobilization    Joint Mobilization  L GH grades 2-3    Passive ROM  PROM all L shoulder motions               PT Short Term Goals - 06/08/19 0845      PT SHORT TERM GOAL #1   Title  independent with initial HEP    Status  Achieved        PT Long Term Goals - 05/28/19 1032      PT LONG TERM GOAL #1   Title  Pt will demonstrate functional L shoulder IR/ER equivalent to R shoulder    Time  8    Period  Weeks    Status  New    Target Date  07/23/19      PT LONG TERM GOAL #2   Title  Pt will demonstrate AROM L shoulder abduction WFL/equivalent to R    Time  8  Period  Weeks    Status  New    Target Date  07/23/19      PT LONG TERM GOAL #3   Title  Pt will report ability to sleep in L sidelying    Time  8    Period  Weeks    Status  New    Target Date  07/23/19      PT LONG TERM GOAL #4   Title  Pt will report ability to don/doff LE clothing without assistance or pain    Time  8    Period  Weeks    Status  New    Target Date  07/23/19      PT LONG TERM GOAL #5   Title  Pt will report ability to perform functional ADLs with no increase in pain    Time  8    Status  New    Target Date  07/23/19            Plan - 06/08/19 0842    Clinical Impression Statement  Pt tolerated an initial progression to TE well. She did well with all the exercise interventions. Pt pain and difficulty noted with IR towel stretch. LUE PROM is fair, pain reported at the end ranges. Internal rotation was very tight with PROM. Tight pecks noted with passive D2 LUE flexion.    Personal Factors and Comorbidities  Age;Sex    Examination-Activity Limitations  Carry;Dressing;Lift;Reach Overhead    Examination-Participation Restrictions  Meal Prep;Cleaning;Community Activity;Laundry    Stability/Clinical Decision Making  Stable/Uncomplicated     Rehab Potential  Good    PT Frequency  3x / week    PT Duration  8 weeks    PT Treatment/Interventions  ADLs/Self Care Home Management;Cryotherapy;Electrical Stimulation;Ultrasound;Moist Heat;Iontophoresis 4mg /ml Dexamethasone;Therapeutic activities;Therapeutic exercise;Neuromuscular re-education;Manual techniques;Patient/family education;Passive range of motion;Dry needling;Taping;Vasopneumatic Device    PT Next Visit Plan  Initiate UE strengthening/flexibity ex's, manual stretching and joint mobs to L shoulder for increased ROM, modalities as indicated       Patient will benefit from skilled therapeutic intervention in order to improve the following deficits and impairments:  Decreased range of motion, Impaired UE functional use, Pain, Hypomobility, Impaired flexibility, Decreased strength, Postural dysfunction  Visit Diagnosis: Stiffness of left shoulder, not elsewhere classified  Adhesive capsulitis of left shoulder  Chronic left shoulder pain     Problem List Patient Active Problem List   Diagnosis Date Noted  . Adhesive capsulitis of left shoulder 05/18/2019  . Lisfranc's sprain, left, subsequent encounter 01/25/2019  . Sleep disturbance 10/29/2016  . Bilateral swelling of feet 08/26/2016  . Attention deficit disorder (ADD) in adult 04/29/2016    Scot Jun, PTA 06/08/2019, 8:45 AM  [place], [place], [address] Phone: [phone]   Fax:  [phone]  Name: Aaryanna Hyden MRN: [id number] Date of Birth: 28-Jun-1960

## 2019-06-10 ENCOUNTER — Ambulatory Visit: Admitting: Physical Therapy

## 2019-06-10 ENCOUNTER — Other Ambulatory Visit: Payer: Self-pay

## 2019-06-10 ENCOUNTER — Encounter: Payer: Self-pay | Admitting: Physical Therapy

## 2019-06-10 DIAGNOSIS — M7502 Adhesive capsulitis of left shoulder: Secondary | ICD-10-CM | POA: Diagnosis not present

## 2019-06-10 DIAGNOSIS — M25612 Stiffness of left shoulder, not elsewhere classified: Secondary | ICD-10-CM

## 2019-06-10 DIAGNOSIS — M6281 Muscle weakness (generalized): Secondary | ICD-10-CM

## 2019-06-10 DIAGNOSIS — G8929 Other chronic pain: Secondary | ICD-10-CM

## 2019-06-10 NOTE — Patient Instructions (Signed)
Access Code: [email or web address] URL: [email or web address] Date: 06/10/2019 Prepared by: Debroah Baller  Exercises Standing Shoulder Flexion to 90 Degrees with Dumbbells - 1 x daily - 7 x weekly - 3 sets - 10 reps Shoulder Abduction with Dumbbells - Thumbs Up - 1 x daily - 7 x weekly - 3 sets - 10 reps

## 2019-06-10 NOTE — Therapy (Signed)
[facility] [address] [place], [place], [address] Phone: [phone]   Fax:  [phone]  Physical Therapy Treatment  Patient Details  Name: Shirley Lee MRN: [id number] Date of Birth: 1960-12-29 Referring Provider (PT): Letta Median   Encounter Date: 06/10/2019  PT End of Session - 06/10/19 0842    Visit Number  3    Date for PT Re-Evaluation  07/28/19    PT Start Time  0802    PT Stop Time  0852    PT Time Calculation (min)  50 min    Activity Tolerance  Patient tolerated treatment well    Behavior During Therapy  [facility] for tasks assessed/performed       Past Medical History:  Diagnosis Date  . Chicken pox   . History of colon polyps     Past Surgical History:  Procedure Laterality Date  . ABDOMINAL HYSTERECTOMY  1989    There were no vitals filed for this visit.  Subjective Assessment - 06/10/19 0802    Subjective  "Great"    Currently in Pain?  No/denies                        [facility] Adult PT Treatment/Exercise - 06/10/19 0001      Shoulder Exercises: Standing   External Rotation  Theraband;20 reps;Left    Theraband Level (Shoulder External Rotation)  Level 2 (Red)    Internal Rotation  Strengthening;Left;Theraband;20 reps    Theraband Level (Shoulder Internal Rotation)  Level 2 (Red)    Flexion  Strengthening;20 reps;Weights;Both    Shoulder Flexion Weight (lbs)  2    ABduction  Strengthening;Both;20 reps;Weights   Some lateral L shoulder soreness    Shoulder ABduction Weight (lbs)  2    Extension  Weights;20 reps;Both;Strengthening    Extension Weight (lbs)  5    Other Standing Exercises  Triceps ext 20lb 3x10     Other Standing Exercises  Counter top to 3rd level 3lb flex 2x10, abd 2nd level 2x10      Shoulder Exercises: ROM/Strengthening   UBE (Upper Arm Bike)  L4 x3 min each way    Other ROM/Strengthening Exercises  Rows & Lats 20lb 2x10       Modalities   Modalities  Moist Heat      Moist Heat Therapy   Number Minutes Moist Heat  12 Minutes    Moist Heat Location  Shoulder      Manual Therapy   Manual Therapy  Passive ROM;Joint mobilization    Joint Mobilization  L GH grades 2-3    Passive ROM  PROM all L shoulder motions               PT Short Term Goals - 06/08/19 0845      PT SHORT TERM GOAL #1   Title  independent with initial HEP    Status  Achieved        PT Long Term Goals - 05/28/19 1032      PT LONG TERM GOAL #1   Title  Pt will demonstrate functional L shoulder IR/ER equivalent to R shoulder    Time  8    Period  Weeks    Status  New    Target Date  07/23/19      PT LONG TERM GOAL #2   Title  Pt will demonstrate AROM L shoulder abduction WFL/equivalent to R    Time  8    Period  Weeks    Status  New    Target Date  07/23/19      PT LONG TERM GOAL #3   Title  Pt will report ability to sleep in L sidelying    Time  8    Period  Weeks    Status  New    Target Date  07/23/19      PT LONG TERM GOAL #4   Title  Pt will report ability to don/doff LE clothing without assistance or pain    Time  8    Period  Weeks    Status  New    Target Date  07/23/19      PT LONG TERM GOAL #5   Title  Pt will report ability to perform functional ADLs with no increase in pain    Time  8    Status  New    Target Date  07/23/19            Plan - 06/10/19 0842    Clinical Impression Statement  Pt did well with a the progression of exercises. Postural cues needed with seated rows. tactile cues needed to keep L arm to her side with external rotation. Some soreness reported with abduction movements. End range flexion tightness noted with PROM. Pt also had some end range tightness with D2 passive motion movement.    Personal Factors and Comorbidities  Age;Sex    Examination-Activity Limitations  Carry;Dressing;Lift;Reach Overhead    Examination-Participation Restrictions  Meal Prep;Cleaning;Community  Activity;Laundry    Stability/Clinical Decision Making  Stable/Uncomplicated    Rehab Potential  Good    PT Frequency  3x / week    PT Duration  8 weeks    PT Treatment/Interventions  ADLs/Self Care Home Management;Cryotherapy;Electrical Stimulation;Ultrasound;Moist Heat;Iontophoresis 4mg /ml Dexamethasone;Therapeutic activities;Therapeutic exercise;Neuromuscular re-education;Manual techniques;Patient/family education;Passive range of motion;Dry needling;Taping;Vasopneumatic Device    PT Next Visit Plan  UE strengthening/flexibity ex's, manual stretching and joint mobs to L shoulder for increased ROM, modalities as indicated       Patient will benefit from skilled therapeutic intervention in order to improve the following deficits and impairments:  Decreased range of motion, Impaired UE functional use, Pain, Hypomobility, Impaired flexibility, Decreased strength, Postural dysfunction  Visit Diagnosis: Adhesive capsulitis of left shoulder  Chronic left shoulder pain  Stiffness of left shoulder, not elsewhere classified  Muscle weakness (generalized)     Problem List Patient Active Problem List   Diagnosis Date Noted  . Adhesive capsulitis of left shoulder 05/18/2019  . Lisfranc's sprain, left, subsequent encounter 01/25/2019  . Sleep disturbance 10/29/2016  . Bilateral swelling of feet 08/26/2016  . Attention deficit disorder (ADD) in adult 04/29/2016    05/01/2016, PTA 06/10/2019, 8:44 AM  [facility] [address] [place], [place], [place] Phone: [phone]   Fax:  [phone]  Name: Cathalina Barcia MRN: Haynes Hoehn Date of Birth: 1960/10/01

## 2019-06-11 ENCOUNTER — Encounter: Payer: Self-pay | Admitting: Physical Therapy

## 2019-06-11 ENCOUNTER — Ambulatory Visit: Admitting: Physical Therapy

## 2019-06-11 DIAGNOSIS — M25512 Pain in left shoulder: Secondary | ICD-10-CM

## 2019-06-11 DIAGNOSIS — M7502 Adhesive capsulitis of left shoulder: Secondary | ICD-10-CM

## 2019-06-11 DIAGNOSIS — M6281 Muscle weakness (generalized): Secondary | ICD-10-CM

## 2019-06-11 DIAGNOSIS — M25612 Stiffness of left shoulder, not elsewhere classified: Secondary | ICD-10-CM

## 2019-06-11 NOTE — Therapy (Signed)
[place], [place], [address] Phone: [phone]   Fax:  [phone]  Physical Therapy Treatment  Patient Details  Name: Shirley Lee MRN: [id number] Date of Birth: 1960/01/23 Referring Provider (PT): Letta Median   Encounter Date: 06/11/2019  PT End of Session - 06/11/19 0838    Visit Number  4    Date for PT Re-Evaluation  07/28/19    PT Start Time  0758    PT Stop Time  0838    PT Time Calculation (min)  40 min    Activity Tolerance  Patient tolerated treatment well    Behavior During Therapy  [facility] for tasks assessed/performed       Past Medical History:  Diagnosis Date  . Chicken pox   . History of colon polyps     Past Surgical History:  Procedure Laterality Date  . ABDOMINAL HYSTERECTOMY  1989    There were no vitals filed for this visit.  Subjective Assessment - 06/11/19 0800    Subjective  "Feels great, no pain yesterday"    Currently in Pain?  No/denies         [facility] PT Assessment - 06/11/19 0001      AROM   Left Shoulder Flexion  145 Degrees    Left Shoulder ABduction  135 Degrees    Left Shoulder Internal Rotation  65 Degrees    Left Shoulder External Rotation  83 Degrees                    [facility] Adult PT Treatment/Exercise - 06/11/19 0001      Shoulder Exercises: Seated   Other Seated Exercises  bent over rows, Ext, & rev flys 3lb 2x10      Shoulder Exercises: Standing   Flexion  Strengthening;20 reps;Both;Theraband    Theraband Level (Shoulder Flexion)  Level 1 (Yellow)    ABduction  Strengthening;Both;20 reps;Theraband    Theraband Level (Shoulder ABduction)  Level 1 (Yellow)    Other Standing Exercises  Triceps ext 20lb 3x10     Other Standing Exercises  D2 Flex yellow LUE 2x10       Shoulder Exercises: ROM/Strengthening   UBE (Upper Arm Bike)  L4 x3 min each way    Wall Wash  Overhead Arcs 3lb with pillow case 2x10     Other  ROM/Strengthening Exercises  Rows & Lats 20lb 2x10     Other ROM/Strengthening Exercises  Chest press 5lb 2x10       Manual Therapy   Manual Therapy  Passive ROM;Joint mobilization    Joint Mobilization  L GH grades 2-3    Passive ROM  PROM all L shoulder motions               PT Short Term Goals - 06/08/19 0845      PT SHORT TERM GOAL #1   Title  independent with initial HEP    Status  Achieved        PT Long Term Goals - 06/11/19 0843      PT LONG TERM GOAL #1   Title  Pt will demonstrate functional L shoulder IR/ER equivalent to R shoulder    Status  Partially Met      PT LONG TERM GOAL #2   Title  Pt will demonstrate AROM L shoulder abduction WFL/equivalent to R    Status  On-going      PT LONG TERM GOAL #3  Title  Pt will report ability to sleep in L sidelying            Plan - 06/11/19 0839    Clinical Impression Statement  Pt is progressing towards goals increasing her LUE AROM. End range flexion and D2 flexion remains with MT. Cue needed to prevent guarding with passive motions. Some pain reported with abduction today. Added chest press without issue    Examination-Activity Limitations  Carry;Dressing;Lift;Reach Overhead    Examination-Participation Restrictions  Meal Prep;Cleaning;Community Activity;Laundry    Stability/Clinical Decision Making  Stable/Uncomplicated    Rehab Potential  Good    PT Frequency  3x / week    PT Duration  8 weeks    PT Treatment/Interventions  ADLs/Self Care Home Management;Cryotherapy;Electrical Stimulation;Ultrasound;Moist Heat;Iontophoresis 19m/ml Dexamethasone;Therapeutic activities;Therapeutic exercise;Neuromuscular re-education;Manual techniques;Patient/family education;Passive range of motion;Dry needling;Taping;Vasopneumatic Device    PT Next Visit Plan  UE strengthening/flexibity ex's, manual stretching and joint mobs to L shoulder for increased ROM, modalities as indicated       Patient will benefit from  skilled therapeutic intervention in order to improve the following deficits and impairments:  Decreased range of motion, Impaired UE functional use, Pain, Hypomobility, Impaired flexibility, Decreased strength, Postural dysfunction  Visit Diagnosis: Chronic left shoulder pain  Adhesive capsulitis of left shoulder  Stiffness of left shoulder, not elsewhere classified  Muscle weakness (generalized)     Problem List Patient Active Problem List   Diagnosis Date Noted  . Adhesive capsulitis of left shoulder 05/18/2019  . Lisfranc's sprain, left, subsequent encounter 01/25/2019  . Sleep disturbance 10/29/2016  . Bilateral swelling of feet 08/26/2016  . Attention deficit disorder (ADD) in adult 04/29/2016    RScot Jun PTA 06/11/2019, 8:43 AM  [place] [address]Phone: [phone]  Fax:  [phone] Name: Shirley LeiteMRN: [id number] of Birth: [birth year]

## 2019-06-15 ENCOUNTER — Ambulatory Visit: Attending: Family Medicine | Admitting: Physical Therapy

## 2019-06-15 ENCOUNTER — Other Ambulatory Visit: Payer: Self-pay

## 2019-06-15 DIAGNOSIS — M6281 Muscle weakness (generalized): Secondary | ICD-10-CM | POA: Insufficient documentation

## 2019-06-15 DIAGNOSIS — M25612 Stiffness of left shoulder, not elsewhere classified: Secondary | ICD-10-CM | POA: Diagnosis present

## 2019-06-15 DIAGNOSIS — G8929 Other chronic pain: Secondary | ICD-10-CM | POA: Insufficient documentation

## 2019-06-15 DIAGNOSIS — M7502 Adhesive capsulitis of left shoulder: Secondary | ICD-10-CM | POA: Diagnosis present

## 2019-06-15 DIAGNOSIS — M25512 Pain in left shoulder: Secondary | ICD-10-CM | POA: Insufficient documentation

## 2019-06-15 NOTE — Therapy (Signed)
[place], [place], [address] Phone: [phone]   Fax:  [phone]  Physical Therapy Treatment  Patient Details  Name: Shirley Lee MRN: [id number] Date of Birth: 1960-11-11 Referring Provider (PT): Letta Median   Encounter Date: 06/15/2019  PT End of Session - 06/15/19 0924    Visit Number  5    Date for PT Re-Evaluation  07/28/19    PT Start Time  0845    PT Stop Time  0935    PT Time Calculation (min)  50 min       Past Medical History:  Diagnosis Date  . Chicken pox   . History of colon polyps     Past Surgical History:  Procedure Laterality Date  . ABDOMINAL HYSTERECTOMY  1989    There were no vitals filed for this visit.  Subjective Assessment - 06/15/19 0848    Subjective  using shld more and noted increased ROM- some pain with OH lifting but it does not last    Currently in Pain?  No/denies                        [facility] Adult PT Treatment/Exercise - 06/15/19 0001      Shoulder Exercises: Standing   External Rotation  Strengthening;Left;20 reps   pulleys 5#   Internal Rotation  Strengthening;Left;20 reps   10# pulley   Other Standing Exercises  cane flex with eccentric lowering in door frame- 2 sets 10 3#   3# cabinet reach with 3# flex and abd   Other Standing Exercises  wt ball OH ext 2 sets 10   5# 3 sets 5 PNF     Shoulder Exercises: ROM/Strengthening   UBE (Upper Arm Bike)  L4 x3 min each way      Modalities   Modalities  Cryotherapy      Cryotherapy   Number Minutes Cryotherapy  10 Minutes    Type of Cryotherapy  Ice pack   end of session     Manual Therapy   Manual Therapy  Passive ROM;Joint mobilization    Joint Mobilization  L GH grades 2-3    Passive ROM  PROM all L shoulder motions   CR with abd to increase ROM and decrease guarding d/t pain              PT Short Term Goals - 06/08/19 0845      PT SHORT TERM  GOAL #1   Title  independent with initial HEP    Status  Achieved        PT Long Term Goals - 06/11/19 0843      PT LONG TERM GOAL #1   Title  Pt will demonstrate functional L shoulder IR/ER equivalent to R shoulder    Status  Partially Met      PT LONG TERM GOAL #2   Title  Pt will demonstrate AROM L shoulder abduction WFL/equivalent to R    Status  On-going      PT LONG TERM GOAL #3   Title  Pt will report ability to sleep in L sidelying            Plan - 06/15/19 0926    Clinical Impression Statement  noted increased func with ex and mvmt, some cuing needed to decrease compensation and fatigue noted. PROM and MT with some pain and guarding.    PT Treatment/Interventions  ADLs/Self  Care Home Management;Cryotherapy;Electrical Stimulation;Ultrasound;Moist Heat;Iontophoresis 48m/ml Dexamethasone;Therapeutic activities;Therapeutic exercise;Neuromuscular re-education;Manual techniques;Patient/family education;Passive range of motion;Dry needling;Taping;Vasopneumatic Device    PT Next Visit Plan  check ROM and goals       Patient will benefit from skilled therapeutic intervention in order to improve the following deficits and impairments:  Decreased range of motion, Impaired UE functional use, Pain, Hypomobility, Impaired flexibility, Decreased strength, Postural dysfunction  Visit Diagnosis: Chronic left shoulder pain  Adhesive capsulitis of left shoulder  Stiffness of left shoulder, not elsewhere classified  Muscle weakness (generalized)     Problem List Patient Active Problem List   Diagnosis Date Noted  . Adhesive capsulitis of left shoulder 05/18/2019  . Lisfranc's sprain, left, subsequent encounter 01/25/2019  . Sleep disturbance 10/29/2016  . Bilateral swelling of feet 08/26/2016  . Attention deficit disorder (ADD) in adult 04/29/2016    Geanette Buonocore,ANGIE PTA 06/15/2019, 9:27 AM  [place] [address] [address]Phone: [phone]  Fax:  [phone] Name: Shirley MaciasMRN: [id number] of Birth: 502-28-1962

## 2019-06-17 ENCOUNTER — Ambulatory Visit: Admitting: Physical Therapy

## 2019-06-18 ENCOUNTER — Other Ambulatory Visit: Payer: Self-pay

## 2019-06-18 ENCOUNTER — Ambulatory Visit: Admitting: Physical Therapy

## 2019-06-18 DIAGNOSIS — M25612 Stiffness of left shoulder, not elsewhere classified: Secondary | ICD-10-CM

## 2019-06-18 DIAGNOSIS — M25512 Pain in left shoulder: Secondary | ICD-10-CM | POA: Diagnosis not present

## 2019-06-18 DIAGNOSIS — G8929 Other chronic pain: Secondary | ICD-10-CM

## 2019-06-18 DIAGNOSIS — M7502 Adhesive capsulitis of left shoulder: Secondary | ICD-10-CM

## 2019-06-18 DIAGNOSIS — M6281 Muscle weakness (generalized): Secondary | ICD-10-CM

## 2019-06-18 NOTE — Therapy (Signed)
[place], [place], [address] Phone: [phone]   Fax:  [phone]  Physical Therapy Treatment  Patient Details  Name: Shirley Lee MRN: [id number] Date of Birth: 1960-06-20 Referring Provider (PT): Shirley Lee   Encounter Date: 06/18/2019  PT End of Session - 06/18/19 0925    Visit Number  6    Date for PT Re-Evaluation  07/28/19    PT Start Time  0845    PT Stop Time  0925    PT Time Calculation (min)  40 min       Past Medical History:  Diagnosis Date  . Chicken pox   . History of colon polyps     Past Surgical History:  Procedure Laterality Date  . ABDOMINAL HYSTERECTOMY  1989    There were no vitals filed for this visit.  Subjective Assessment - 06/18/19 0848    Subjective  very sore after last time but okay that afternoon- I want to get better    Currently in Pain?  No/denies         [facility] PT Assessment - 06/18/19 0001      AROM   AROM Assessment Site  Shoulder    Right/Left Shoulder  Left   STANDING   Left Shoulder Flexion  155 Degrees    Left Shoulder ABduction  155 Degrees    Left Shoulder Internal Rotation  67 Degrees    Left Shoulder External Rotation  83 Degrees                    [facility] Adult PT Treatment/Exercise - 06/18/19 0001      Shoulder Exercises: Standing   Other Standing Exercises  cabinet reaching 3# and 4#- 5 x each wt flex and abd   wt ball trampoline throw flex and abd   Other Standing Exercises  wt ball OH ext 2 sets 10   3# flex,abd and PNF     Shoulder Exercises: ROM/Strengthening   UBE (Upper Arm Bike)  L4 x3 min each way    Other ROM/Strengthening Exercises  Rows & Lats 25#  2x15      Manual Therapy   Manual Therapy  Passive ROM;Joint mobilization    Joint Mobilization  L GH grades 2-3    Passive ROM  PROM all L shoulder motions   CR with abd              PT Short Term Goals - 06/08/19 0845      PT SHORT TERM GOAL #1   Title  independent with initial HEP    Status  Achieved        PT Long Term Goals - 06/18/19 0924      PT LONG TERM GOAL #1   Title  Pt will demonstrate functional L shoulder IR/ER equivalent to R shoulder    Status  Partially Met      PT LONG TERM GOAL #2   Title  Pt will demonstrate AROM L shoulder abduction WFL/equivalent to R    Status  Partially Met      PT LONG TERM GOAL #3   Title  Pt will report ability to sleep in L sidelying    Status  Partially Met      PT LONG TERM GOAL #4   Title  Pt will report ability to don/doff LE clothing without assistance or pain    Status  Partially Met  PT LONG TERM GOAL #5   Title  Pt will report ability to perform functional ADLs with no increase in pain    Status  Partially Met            Plan - 06/18/19 0925    Clinical Impression Statement  progressing with goals and ROM increasing. pain and limitations mostly with abd and needs cuing with ex to decrease compensations.    PT Treatment/Interventions  ADLs/Self Care Home Management;Cryotherapy;Electrical Stimulation;Ultrasound;Moist Heat;Iontophoresis 14m/ml Dexamethasone;Therapeutic activities;Therapeutic exercise;Neuromuscular re-education;Manual techniques;Patient/family education;Passive range of motion;Dry needling;Taping;Vasopneumatic Device    PT Next Visit Plan  progress ROM and func       Patient will benefit from skilled therapeutic intervention in order to improve the following deficits and impairments:  Decreased range of motion, Impaired UE functional use, Pain, Hypomobility, Impaired flexibility, Decreased strength, Postural dysfunction  Visit Diagnosis: Chronic left shoulder pain  Adhesive capsulitis of left shoulder  Stiffness of left shoulder, not elsewhere classified  Muscle weakness (generalized)     Problem List Patient Active Problem List   Diagnosis Date Noted  . Adhesive capsulitis of left shoulder 05/18/2019  .  Lisfranc's sprain, left, subsequent encounter 01/25/2019  . Sleep disturbance 10/29/2016  . Bilateral swelling of feet 08/26/2016  . Attention deficit disorder (ADD) in adult 04/29/2016    Shirley Lee,Shirley Lee PTA 06/18/2019, 9:27 AM  [place] [address]Phone: [phone]  Fax:  [phone] Name: Shirley StylesMRN: [id number] of Birth: 529-Oct-1962

## 2019-06-21 ENCOUNTER — Ambulatory Visit: Admitting: Physical Therapy

## 2019-06-21 ENCOUNTER — Other Ambulatory Visit: Payer: Self-pay

## 2019-06-21 ENCOUNTER — Encounter: Payer: Self-pay | Admitting: Physical Therapy

## 2019-06-21 DIAGNOSIS — M25512 Pain in left shoulder: Secondary | ICD-10-CM | POA: Diagnosis not present

## 2019-06-21 DIAGNOSIS — M25612 Stiffness of left shoulder, not elsewhere classified: Secondary | ICD-10-CM

## 2019-06-21 DIAGNOSIS — M7502 Adhesive capsulitis of left shoulder: Secondary | ICD-10-CM

## 2019-06-21 DIAGNOSIS — M6281 Muscle weakness (generalized): Secondary | ICD-10-CM

## 2019-06-21 DIAGNOSIS — G8929 Other chronic pain: Secondary | ICD-10-CM

## 2019-06-21 NOTE — Therapy (Signed)
[place], [place], [address] Phone: [phone]   Fax:  [phone]  Physical Therapy Treatment  Patient Details  Name: Shirley Lee MRN: [id number] Date of Birth: Apr 22, 1960 Referring Provider (PT): Letta Median   Encounter Date: 06/21/2019  PT End of Session - 06/21/19 0927    Visit Number  7    Date for PT Re-Evaluation  07/28/19    PT Start Time  0845    PT Stop Time  0928    PT Time Calculation (min)  43 min    Activity Tolerance  Patient tolerated treatment well    Behavior During Therapy  [facility] for tasks assessed/performed       Past Medical History:  Diagnosis Date   Chicken pox    History of colon polyps     Past Surgical History:  Procedure Laterality Date   ABDOMINAL HYSTERECTOMY  1989    There were no vitals filed for this visit.  Subjective Assessment - 06/21/19 0850    Subjective  Pt reports she is doing much better with just some issues with functional strength    Currently in Pain?  No/denies    Pain Score  0-No pain                        [facility] Adult PT Treatment/Exercise - 06/21/19 0001      Shoulder Exercises: Standing   Flexion  Both;10 reps;Weights    Shoulder Flexion Weight (lbs)  3    Flexion Limitations  2x10    ABduction  Both;10 reps;Weights    Shoulder ABduction Weight (lbs)  3    ABduction Limitations  2x10    Other Standing Exercises  cabinet reaching 3# and 4#- 5 x each wt flex and abd    Other Standing Exercises  wt ball ER trampoline 2x10, throwing behind ER 2x10      Shoulder Exercises: ROM/Strengthening   UBE (Upper Arm Bike)  L4 x3 min each way    Other ROM/Strengthening Exercises  Rows & Lats 25#  2x15    Other ROM/Strengthening Exercises  Chest press 10# 2x15, shoulder ext 10# 2x15, L IR/ER 5# 2x15      Manual Therapy   Manual Therapy  Passive ROM;Joint mobilization    Joint Mobilization  L GH grades 2-3     Passive ROM  PROM all L shoulder motions               PT Short Term Goals - 06/08/19 0845      PT SHORT TERM GOAL #1   Title  independent with initial HEP    Status  Achieved        PT Long Term Goals - 06/18/19 0924      PT LONG TERM GOAL #1   Title  Pt will demonstrate functional L shoulder IR/ER equivalent to R shoulder    Status  Partially Met      PT LONG TERM GOAL #2   Title  Pt will demonstrate AROM L shoulder abduction WFL/equivalent to R    Status  Partially Met      PT LONG TERM GOAL #3   Title  Pt will report ability to sleep in L sidelying    Status  Partially Met      PT LONG TERM GOAL #4   Title  Pt will report ability to don/doff LE clothing without assistance or pain  Status  Partially Met      PT LONG TERM GOAL #5   Title  Pt will report ability to perform functional ADLs with no increase in pain    Status  Partially Met            Plan - 06/21/19 0927    Clinical Impression Statement  Pt tolerated progression of TE well. Cues during ER ex's for form; some compensation noted with abduction. Did well with passive ROM; pain at end ranges of IR/ER and abduction.    PT Treatment/Interventions  ADLs/Self Care Home Management;Cryotherapy;Electrical Stimulation;Ultrasound;Moist Heat;Iontophoresis 39m/ml Dexamethasone;Therapeutic activities;Therapeutic exercise;Neuromuscular re-education;Manual techniques;Patient/family education;Passive range of motion;Dry needling;Taping;Vasopneumatic Device    PT Next Visit Plan  progress ROM and func    Consulted and Agree with Plan of Care  Patient       Patient will benefit from skilled therapeutic intervention in order to improve the following deficits and impairments:  Decreased range of motion, Impaired UE functional use, Pain, Hypomobility, Impaired flexibility, Decreased strength, Postural dysfunction  Visit Diagnosis: Chronic left shoulder pain  Adhesive capsulitis of left shoulder  Stiffness  of left shoulder, not elsewhere classified  Muscle weakness (generalized)     Problem List Patient Active Problem List   Diagnosis Date Noted   Adhesive capsulitis of left shoulder 05/18/2019   Lisfranc's sprain, left, subsequent encounter 01/25/2019   Sleep disturbance 10/29/2016   Bilateral swelling of feet 08/26/2016   Attention deficit disorder (ADD) in adult 04/29/2016   AAmador Cunas PT, DPT ADonald ProseSugg 06/21/2019, 9:29 AM  [place] [address]Phone: [phone]  Fax:  [phone] Name: Shirley WittmeyerMRN: [id number] of Birth: [birth year]

## 2019-06-23 ENCOUNTER — Ambulatory Visit: Admitting: Physical Therapy

## 2019-06-25 ENCOUNTER — Encounter: Admitting: Physical Therapy

## 2019-06-28 ENCOUNTER — Other Ambulatory Visit: Payer: Self-pay

## 2019-06-28 ENCOUNTER — Encounter: Payer: Self-pay | Admitting: Physical Therapy

## 2019-06-28 ENCOUNTER — Ambulatory Visit: Admitting: Physical Therapy

## 2019-06-28 DIAGNOSIS — G8929 Other chronic pain: Secondary | ICD-10-CM

## 2019-06-28 DIAGNOSIS — M7502 Adhesive capsulitis of left shoulder: Secondary | ICD-10-CM

## 2019-06-28 DIAGNOSIS — M25512 Pain in left shoulder: Secondary | ICD-10-CM | POA: Diagnosis not present

## 2019-06-28 DIAGNOSIS — M6281 Muscle weakness (generalized): Secondary | ICD-10-CM

## 2019-06-28 DIAGNOSIS — M25612 Stiffness of left shoulder, not elsewhere classified: Secondary | ICD-10-CM

## 2019-06-28 NOTE — Therapy (Signed)
[place], [place], [address] Phone: [phone]   Fax:  [phone]  Physical Therapy Treatment  Patient Details  Name: Shirley Lee MRN: [id number] Date of Birth: 1960-10-21 Referring Provider (PT): Letta Median   Encounter Date: 06/28/2019   PT End of Session - 06/28/19 0923    Visit Number 8    Date for PT Re-Evaluation 07/28/19    PT Start Time 0845    PT Stop Time 0927    PT Time Calculation (min) 42 min    Activity Tolerance Patient tolerated treatment well    Behavior During Therapy [facility] for tasks assessed/performed           Past Medical History:  Diagnosis Date  . Chicken pox   . History of colon polyps     Past Surgical History:  Procedure Laterality Date  . ABDOMINAL HYSTERECTOMY  1989    There were no vitals filed for this visit.   Subjective Assessment - 06/28/19 0847    Subjective Doing great. Arm is a lot better    Currently in Pain? No/denies                             [facility] Adult PT Treatment/Exercise - 06/28/19 0001      Shoulder Exercises: Standing   Other Standing Exercises cabinet reaching 4# x10 each wt flex and abd    Other Standing Exercises Behind back weightes ball ER/IR 2x10 each       Shoulder Exercises: ROM/Strengthening   UBE (Upper Arm Bike) L5 x3 min each way    Other ROM/Strengthening Exercises Rows 35lb & Lats 25#  2x10    Other ROM/Strengthening Exercises Chest press 10# 2x15, shoulder ext 10# 2x15, L IR/ER 5# 2x15      Manual Therapy   Manual Therapy Passive ROM;Joint mobilization    Joint Mobilization L GH grades 2-3    Passive ROM PROM all L shoulder motions                    PT Short Term Goals - 06/08/19 0845      PT SHORT TERM GOAL #1   Title independent with initial HEP    Status Achieved             PT Long Term Goals - 06/18/19 0924      PT LONG TERM GOAL #1   Title Pt will  demonstrate functional L shoulder IR/ER equivalent to R shoulder    Status Partially Met      PT LONG TERM GOAL #2   Title Pt will demonstrate AROM L shoulder abduction WFL/equivalent to R    Status Partially Met      PT LONG TERM GOAL #3   Title Pt will report ability to sleep in L sidelying    Status Partially Met      PT LONG TERM GOAL #4   Title Pt will report ability to don/doff LE clothing without assistance or pain    Status Partially Met      PT LONG TERM GOAL #5   Title Pt will report ability to perform functional ADLs with no increase in pain    Status Partially Met                 Plan - 06/28/19 0924    Clinical Impression Statement Good effort throughout treatment session.  No report of increase pain. Tactile cue needed with LUE ER/IR to prevent ant compensation. Increase resistance tolerated with seated rows. Positive response to jt mobs evident bu increase ROM and decrease guarding.    Personal Factors and Comorbidities Age;Sex    Examination-Activity Limitations Carry;Dressing;Lift;Reach Overhead    Examination-Participation Restrictions Meal Prep;Cleaning;Community Activity;Laundry    Stability/Clinical Decision Making Stable/Uncomplicated    Rehab Potential Good    PT Frequency 3x / week    PT Duration 8 weeks    PT Treatment/Interventions ADLs/Self Care Home Management;Cryotherapy;Electrical Stimulation;Ultrasound;Moist Heat;Iontophoresis 75m/ml Dexamethasone;Therapeutic activities;Therapeutic exercise;Neuromuscular re-education;Manual techniques;Patient/family education;Passive range of motion;Dry needling;Taping;Vasopneumatic Device    PT Next Visit Plan progress ROM and func           Patient will benefit from skilled therapeutic intervention in order to improve the following deficits and impairments:  Decreased range of motion, Impaired UE functional use, Pain, Hypomobility, Impaired flexibility, Decreased strength, Postural dysfunction  Visit  Diagnosis: Adhesive capsulitis of left shoulder  Chronic left shoulder pain  Stiffness of left shoulder, not elsewhere classified  Muscle weakness (generalized)     Problem List Patient Active Problem List   Diagnosis Date Noted  . Adhesive capsulitis of left shoulder 05/18/2019  . Lisfranc's sprain, left, subsequent encounter 01/25/2019  . Sleep disturbance 10/29/2016  . Bilateral swelling of feet 08/26/2016  . Attention deficit disorder (ADD) in adult 04/29/2016    RScot Jun PTA 06/28/2019, 9:26 AM  [place] [address]Phone: [phone]  Fax:  [phone] Name: Shirley WitherspoonMRN: [id number] of Birth: [birth year]

## 2019-06-30 ENCOUNTER — Encounter: Payer: Self-pay | Admitting: Physical Therapy

## 2019-06-30 ENCOUNTER — Ambulatory Visit: Admitting: Physical Therapy

## 2019-06-30 ENCOUNTER — Other Ambulatory Visit: Payer: Self-pay

## 2019-06-30 DIAGNOSIS — M25612 Stiffness of left shoulder, not elsewhere classified: Secondary | ICD-10-CM

## 2019-06-30 DIAGNOSIS — M7502 Adhesive capsulitis of left shoulder: Secondary | ICD-10-CM

## 2019-06-30 DIAGNOSIS — M25512 Pain in left shoulder: Secondary | ICD-10-CM

## 2019-06-30 DIAGNOSIS — M6281 Muscle weakness (generalized): Secondary | ICD-10-CM

## 2019-06-30 NOTE — Therapy (Signed)
[place], [place], [address] Phone: [phone]   Fax:  [phone]  Physical Therapy Treatment  Patient Details  Name: Shirley Lee MRN: [id number] Date of Birth: Feb 02, 1960 Referring Provider (PT): Letta Median   Encounter Date: 06/30/2019   PT End of Session - 06/30/19 0930    Visit Number 9    Date for PT Re-Evaluation 07/28/19    PT Start Time 0844    PT Stop Time 0930    PT Time Calculation (min) 46 min    Activity Tolerance Patient tolerated treatment well    Behavior During Therapy [facility] for tasks assessed/performed           Past Medical History:  Diagnosis Date  . Chicken pox   . History of colon polyps     Past Surgical History:  Procedure Laterality Date  . ABDOMINAL HYSTERECTOMY  1989    There were no vitals filed for this visit.   Subjective Assessment - 06/30/19 0847    Subjective Pt reports she is doing really well; states that she has pain with end range extension but is not having any trouble around the house anymore    Currently in Pain? No/denies    Pain Score 0-No pain    Pain Location Shoulder    Pain Orientation Left                             [facility] Adult PT Treatment/Exercise - 06/30/19 0001      Shoulder Exercises: Standing   Flexion Both;10 reps;Weights    Shoulder Flexion Weight (lbs) 5    ABduction Both;10 reps;Weights    Shoulder ABduction Weight (lbs) 5    Other Standing Exercises cabinet reaching 5# 2x10 each wt flex and abd    Other Standing Exercises Behind back weightes ball ER/IR 2x10 each, circles behind back and behind head with green ball x10 each direction      Shoulder Exercises: ROM/Strengthening   UBE (Upper Arm Bike) L5 x3 min each way    Other ROM/Strengthening Exercises rows and lats 35# 2x10, flys 5# 2x10    Other ROM/Strengthening Exercises chest press 15# 2x10, shoulder ext 10# 2x10, L IR/ER 5#  2x15      Manual Therapy   Manual Therapy Passive ROM;Joint mobilization    Joint Mobilization L GH grades 2-3    Passive ROM PROM all L shoulder motions                    PT Short Term Goals - 06/08/19 0845      PT SHORT TERM GOAL #1   Title independent with initial HEP    Status Achieved             PT Long Term Goals - 06/18/19 0924      PT LONG TERM GOAL #1   Title Pt will demonstrate functional L shoulder IR/ER equivalent to R shoulder    Status Partially Met      PT LONG TERM GOAL #2   Title Pt will demonstrate AROM L shoulder abduction WFL/equivalent to R    Status Partially Met      PT LONG TERM GOAL #3   Title Pt will report ability to sleep in L sidelying    Status Partially Met      PT LONG TERM GOAL #4   Title Pt will report  ability to don/doff LE clothing without assistance or pain    Status Partially Met      PT LONG TERM GOAL #5   Title Pt will report ability to perform functional ADLs with no increase in pain    Status Partially Met                 Plan - 06/30/19 0930    Clinical Impression Statement Pt is doing well with treatment progressions with no complaints of increased shoulder pain with any ex's. Educated pt on proper form for planks per pt request. Muscle spasms during passive ROM and joint mobs; positive response to MET for IR/ER.    PT Treatment/Interventions ADLs/Self Care Home Management;Cryotherapy;Electrical Stimulation;Ultrasound;Moist Heat;Iontophoresis 45m/ml Dexamethasone;Therapeutic activities;Therapeutic exercise;Neuromuscular re-education;Manual techniques;Patient/family education;Passive range of motion;Dry needling;Taping;Vasopneumatic Device    PT Next Visit Plan progress ROM and func    Consulted and Agree with Plan of Care Patient           Patient will benefit from skilled therapeutic intervention in order to improve the following deficits and impairments:  Decreased range of motion, Impaired UE  functional use, Pain, Hypomobility, Impaired flexibility, Decreased strength, Postural dysfunction  Visit Diagnosis: Adhesive capsulitis of left shoulder  Chronic left shoulder pain  Stiffness of left shoulder, not elsewhere classified  Muscle weakness (generalized)     Problem List Patient Active Problem List   Diagnosis Date Noted  . Adhesive capsulitis of left shoulder 05/18/2019  . Lisfranc's sprain, left, subsequent encounter 01/25/2019  . Sleep disturbance 10/29/2016  . Bilateral swelling of feet 08/26/2016  . Attention deficit disorder (ADD) in adult 04/29/2016   AAmador Cunas PT, DPT ADonald ProseSugg 06/30/2019, 9:32 AM  [place] [address]Phone: [phone]  Fax:  [phone] Name: Shirley DewanMRN: [id number] of Birth: 503/12/1960

## 2019-07-02 ENCOUNTER — Encounter: Payer: Self-pay | Admitting: Physical Therapy

## 2019-07-02 ENCOUNTER — Ambulatory Visit: Admitting: Physical Therapy

## 2019-07-02 ENCOUNTER — Other Ambulatory Visit: Payer: Self-pay

## 2019-07-02 DIAGNOSIS — M25612 Stiffness of left shoulder, not elsewhere classified: Secondary | ICD-10-CM

## 2019-07-02 DIAGNOSIS — M6281 Muscle weakness (generalized): Secondary | ICD-10-CM

## 2019-07-02 DIAGNOSIS — G8929 Other chronic pain: Secondary | ICD-10-CM

## 2019-07-02 DIAGNOSIS — M25512 Pain in left shoulder: Secondary | ICD-10-CM | POA: Diagnosis not present

## 2019-07-02 DIAGNOSIS — M7502 Adhesive capsulitis of left shoulder: Secondary | ICD-10-CM

## 2019-07-02 NOTE — Therapy (Signed)
[place], [place], [address] Phone: [phone]   Fax:  [phone]  Physical Therapy Treatment Progress Note Reporting Period 05/28/2019 to 07/02/2019  See note below for Objective Data and Assessment of Progress/Goals.      Patient Details  Name: Shirley Lee MRN: [id number] Date of Birth: [birth year] Referring Provider (PT): Letta Median   Encounter Date: 07/02/2019   PT End of Session - 07/02/19 0928    Visit Number 10    Date for PT Re-Evaluation 07/28/19    PT Start Time 0845    PT Stop Time 0928    PT Time Calculation (min) 43 min    Activity Tolerance Patient tolerated treatment well    Behavior During Therapy [facility] for tasks assessed/performed           Past Medical History:  Diagnosis Date  . Chicken pox   . History of colon polyps     Past Surgical History:  Procedure Laterality Date  . ABDOMINAL HYSTERECTOMY  1989    There were no vitals filed for this visit.   Subjective Assessment - 07/02/19 0903    Subjective Pt states that she is doing well; has doctors appt on 6/29    Currently in Pain? No/denies    Pain Score 0-No pain    Pain Location Shoulder    Pain Orientation Left                             [facility] Adult PT Treatment/Exercise - 07/02/19 0001      Shoulder Exercises: Standing   Flexion Both;10 reps;Weights    Shoulder Flexion Weight (lbs) 5    ABduction Both;10 reps;Weights    Shoulder ABduction Weight (lbs) 5    Other Standing Exercises cabinet reaching 5# 2x10 each wt flex and abd    Other Standing Exercises Behind back weightes ball ER/IR 2x10 each, circles behind back and behind head with green ball x10 each direction      Shoulder Exercises: ROM/Strengthening   UBE (Upper Arm Bike) L5 x3 min each way    Other ROM/Strengthening Exercises rows and lats 35# 2x15, flys 5# 2x10    Other ROM/Strengthening Exercises chest  press 15# 2x10, shoulder ext 15# 2x10, L IR/ER 5# 2x15      Manual Therapy   Manual Therapy Passive ROM;Joint mobilization    Joint Mobilization L GH grades 2-3    Passive ROM PROM all L shoulder motions                    PT Short Term Goals - 06/08/19 0845      PT SHORT TERM GOAL #1   Title independent with initial HEP    Status Achieved             PT Long Term Goals - 07/02/19 0930      PT LONG TERM GOAL #1   Title Pt will demonstrate functional L shoulder IR/ER equivalent to R shoulder    Status Partially Met      PT LONG TERM GOAL #2   Title Pt will demonstrate AROM L shoulder abduction WFL/equivalent to R    Status Achieved      PT LONG TERM GOAL #3   Title Pt will report ability to sleep in L sidelying    Status Partially Met      PT LONG TERM GOAL #  4   Title Pt will report ability to don/doff LE clothing without assistance or pain    Status Achieved      PT LONG TERM GOAL #5   Title Pt will report ability to perform functional ADLs with no increase in pain    Status Achieved                 Plan - 07/02/19 0928    Clinical Impression Statement Pt doing well with treatment progression; no increased shoulder pain with ex today. Pt is making progress with LTG; some limitations in functional IR still present. Pt tight with passive ROM; positive response to MET for IR/ER.    PT Treatment/Interventions ADLs/Self Care Home Management;Cryotherapy;Electrical Stimulation;Ultrasound;Moist Heat;Iontophoresis 35m/ml Dexamethasone;Therapeutic activities;Therapeutic exercise;Neuromuscular re-education;Manual techniques;Patient/family education;Passive range of motion;Dry needling;Taping;Vasopneumatic Device    PT Next Visit Plan progress ROM and func    Consulted and Agree with Plan of Care Patient           Patient will benefit from skilled therapeutic intervention in order to improve the following deficits and impairments:  Decreased range of motion,  Impaired UE functional use, Pain, Hypomobility, Impaired flexibility, Decreased strength, Postural dysfunction  Visit Diagnosis: Adhesive capsulitis of left shoulder  Chronic left shoulder pain  Stiffness of left shoulder, not elsewhere classified  Muscle weakness (generalized)     Problem List Patient Active Problem List   Diagnosis Date Noted  . Adhesive capsulitis of left shoulder 05/18/2019  . Lisfranc's sprain, left, subsequent encounter 01/25/2019  . Sleep disturbance 10/29/2016  . Bilateral swelling of feet 08/26/2016  . Attention deficit disorder (ADD) in adult 04/29/2016   AAmador Cunas PT, DPT ADonald ProseSugg 07/02/2019, 9:31 AM  [place] [address]Phone: [phone]  Fax:  [phone] Name: NTennille MontelongoMRN: [id number] of Birth: 505-11-1960

## 2019-07-05 ENCOUNTER — Ambulatory Visit: Admitting: Physical Therapy

## 2019-07-07 ENCOUNTER — Other Ambulatory Visit: Payer: Self-pay

## 2019-07-07 ENCOUNTER — Encounter: Payer: Self-pay | Admitting: Physical Therapy

## 2019-07-07 ENCOUNTER — Ambulatory Visit: Admitting: Physical Therapy

## 2019-07-07 DIAGNOSIS — M6281 Muscle weakness (generalized): Secondary | ICD-10-CM

## 2019-07-07 DIAGNOSIS — M25512 Pain in left shoulder: Secondary | ICD-10-CM

## 2019-07-07 DIAGNOSIS — M7502 Adhesive capsulitis of left shoulder: Secondary | ICD-10-CM

## 2019-07-07 DIAGNOSIS — M25612 Stiffness of left shoulder, not elsewhere classified: Secondary | ICD-10-CM

## 2019-07-07 NOTE — Therapy (Signed)
[place], [place], [address] Phone: [phone]   Fax:  [phone]  Physical Therapy Treatment  Patient Details  Name: Shirley Lee MRN: [id number] Date of Birth: [birth year] Referring Provider (PT): Shirley Lee   Encounter Date: 07/07/2019   PT End of Session - 07/07/19 0927    Visit Number 11    Date for PT Re-Evaluation 07/28/19    PT Start Time 0850    PT Stop Time 0929    PT Time Calculation (min) 39 min    Activity Tolerance Patient tolerated treatment well    Behavior During Therapy [facility] for tasks assessed/performed           Past Medical History:  Diagnosis Date  . Chicken pox   . History of colon polyps     Past Surgical History:  Procedure Laterality Date  . ABDOMINAL HYSTERECTOMY  1989    There were no vitals filed for this visit.   Subjective Assessment - 07/07/19 0852    Subjective Doing fine, Reorganize garage this weekend, used a drill and staple gun    Currently in Pain? No/denies                             [facility] Adult PT Treatment/Exercise - 07/07/19 0001      Shoulder Exercises: Standing   External Rotation Strengthening;Left;20 reps;Weights    External Rotation Weight (lbs) 5    Internal Rotation Strengthening;Weights;20 reps    Internal Rotation Weight (lbs) 5    Extension Weights;20 reps;Both;Strengthening    Extension Weight (lbs) 10    Other Standing Exercises Flex Abd combo 3lb 2x10    Other Standing Exercises Behind back weightes ball ER/IR 2x10 each       Shoulder Exercises: ROM/Strengthening   UBE (Upper Arm Bike) L5 x3 min each way    Other ROM/Strengthening Exercises rows and lats 35# 2x15,    Other ROM/Strengthening Exercises chest press 15# 2x10, shoulder ext 15# 2x10, L IR/ER 5# 2x15      Manual Therapy   Manual Therapy Passive ROM;Joint mobilization    Manual therapy comments Tightness with IR    Passive  ROM PROM all L shoulder motions                    PT Short Term Goals - 06/08/19 0845      PT SHORT TERM GOAL #1   Title independent with initial HEP    Status Achieved             PT Long Term Goals - 07/02/19 0930      PT LONG TERM GOAL #1   Title Pt will demonstrate functional L shoulder IR/ER equivalent to R shoulder    Status Partially Met      PT LONG TERM GOAL #2   Title Pt will demonstrate AROM L shoulder abduction WFL/equivalent to R    Status Achieved      PT LONG TERM GOAL #3   Title Pt will report ability to sleep in L sidelying    Status Partially Met      PT LONG TERM GOAL #4   Title Pt will report ability to don/doff LE clothing without assistance or pain    Status Achieved      PT LONG TERM GOAL #5   Title Pt will report ability to perform functional ADLs with no  increase in pain    Status Achieved                 Plan - 07/07/19 0927    Clinical Impression Statement Pt reports improvement overall. No reports of increase shoulder pain with the interventions. Tactile cues needed to keep elbow to her side with internal and external rotation. Little shoulder elevation noted with flexion and abduction. Tightness with passive IR noted.    Personal Factors and Comorbidities Age;Sex    Examination-Activity Limitations Carry;Dressing;Lift;Reach Overhead    Examination-Participation Restrictions Meal Prep;Cleaning;Community Activity;Laundry    Stability/Clinical Decision Making Stable/Uncomplicated    Rehab Potential Good    PT Frequency 3x / week    PT Duration 8 weeks    PT Treatment/Interventions ADLs/Self Care Home Management;Cryotherapy;Electrical Stimulation;Ultrasound;Moist Heat;Iontophoresis 73m/ml Dexamethasone;Therapeutic activities;Therapeutic exercise;Neuromuscular re-education;Manual techniques;Patient/family education;Passive range of motion;Dry needling;Taping;Vasopneumatic Device    PT Next Visit Plan progress ROM and func,  Note for MD           Patient will benefit from skilled therapeutic intervention in order to improve the following deficits and impairments:  Decreased range of motion, Impaired UE functional use, Pain, Hypomobility, Impaired flexibility, Decreased strength, Postural dysfunction  Visit Diagnosis: Stiffness of left shoulder, not elsewhere classified  Chronic left shoulder pain  Adhesive capsulitis of left shoulder  Muscle weakness (generalized)     Problem List Patient Active Problem List   Diagnosis Date Noted  . Adhesive capsulitis of left shoulder 05/18/2019  . Lisfranc's sprain, left, subsequent encounter 01/25/2019  . Sleep disturbance 10/29/2016  . Bilateral swelling of feet 08/26/2016  . Attention deficit disorder (ADD) in adult 04/29/2016    RScot Jun PTA 07/07/2019, 9:30 AM  [place] [address]Phone: [phone]  Fax:  [phone] Name: NNatia FahmyMRN: [id number] of Birth: [birth year]

## 2019-07-09 ENCOUNTER — Ambulatory Visit: Admitting: Physical Therapy

## 2019-07-09 ENCOUNTER — Other Ambulatory Visit: Payer: Self-pay

## 2019-07-09 DIAGNOSIS — M7502 Adhesive capsulitis of left shoulder: Secondary | ICD-10-CM

## 2019-07-09 DIAGNOSIS — M25612 Stiffness of left shoulder, not elsewhere classified: Secondary | ICD-10-CM

## 2019-07-09 DIAGNOSIS — M25512 Pain in left shoulder: Secondary | ICD-10-CM | POA: Diagnosis not present

## 2019-07-09 DIAGNOSIS — M6281 Muscle weakness (generalized): Secondary | ICD-10-CM

## 2019-07-09 NOTE — Therapy (Signed)
[place], [place], [address] Phone: [phone]   Fax:  [phone]  Physical Therapy Treatment  Patient Details  Name: Shirley Lee MRN: [id number] Date of Birth: 11-28-1960 Referring Provider (PT): Shirley Lee   Encounter Date: 07/09/2019   PT End of Session - 07/09/19 0925    Visit Number 12    Date for PT Re-Evaluation 07/28/19    PT Start Time 0845    PT Stop Time 0925    PT Time Calculation (min) 40 min    Activity Tolerance Patient tolerated treatment well    Behavior During Therapy [facility] for tasks assessed/performed           Past Medical History:  Diagnosis Date  . Chicken pox   . History of colon polyps     Past Surgical History:  Procedure Laterality Date  . ABDOMINAL HYSTERECTOMY  1989    There were no vitals filed for this visit.   Subjective Assessment - 07/09/19 0849    Subjective Pt reports she is doing really well; did a lot of home projects and lifting this week with no problems with L shoulder    Currently in Pain? No/denies    Pain Score 0-No pain    Pain Location Shoulder    Pain Orientation Left              [facility] PT Assessment - 07/09/19 0001      AROM   AROM Assessment Site Shoulder    Right/Left Shoulder Left    Left Shoulder Flexion 155 Degrees    Left Shoulder ABduction 180 Degrees    Left Shoulder Internal Rotation 90 Degrees    Left Shoulder External Rotation 85 Degrees                         [facility] Adult PT Treatment/Exercise - 07/09/19 0001      Shoulder Exercises: ROM/Strengthening   UBE (Upper Arm Bike) L5 x3 min each way    Other ROM/Strengthening Exercises rows and lats 35# 2x15,    Other ROM/Strengthening Exercises chest press 15# 2x15, shoulder ext 15# 2x10, L IR/ER 5# 2x15                    PT Short Term Goals - 06/08/19 0845      PT SHORT TERM GOAL #1   Title independent with initial  HEP    Status Achieved             PT Long Term Goals - 07/09/19 0914      PT LONG TERM GOAL #1   Title Pt will demonstrate functional L shoulder IR/ER equivalent to R shoulder    Status Achieved      PT LONG TERM GOAL #2   Title Pt will demonstrate AROM L shoulder abduction WFL/equivalent to R    Status Achieved      PT LONG TERM GOAL #3   Title Pt will report ability to sleep in L sidelying    Status Achieved      PT LONG TERM GOAL #4   Title Pt will report ability to don/doff LE clothing without assistance or pain    Status Achieved      PT LONG TERM GOAL #5   Title Pt will report ability to perform functional ADLs with no increase in pain    Status Achieved  Plan - 07/09/19 0925    Clinical Impression Statement Pt has met all goals and reports that she is having no pain or complaints with functional ADLs. Pt demonstrates improved L shoulder ROM equivalent to R shoulder this rx. Pt has some residual tightness in IR/ER; educated on continuance of stretches and appropriate exercises to complete at gym upon d/c.    PT Treatment/Interventions ADLs/Self Care Home Management;Cryotherapy;Electrical Stimulation;Ultrasound;Moist Heat;Iontophoresis 71m/ml Dexamethasone;Therapeutic activities;Therapeutic exercise;Neuromuscular re-education;Manual techniques;Patient/family education;Passive range of motion;Dry needling;Taping;Vasopneumatic Device    PT Next Visit Plan progress ROM and func, Note for MD    Consulted and Agree with Plan of Care Patient           Patient will benefit from skilled therapeutic intervention in order to improve the following deficits and impairments:  Decreased range of motion, Impaired UE functional use, Pain, Hypomobility, Impaired flexibility, Decreased strength, Postural dysfunction  Visit Diagnosis: Stiffness of left shoulder, not elsewhere classified  Chronic left shoulder pain  Adhesive capsulitis of left  shoulder  Muscle weakness (generalized)     Problem List Patient Active Problem List   Diagnosis Date Noted  . Adhesive capsulitis of left shoulder 05/18/2019  . Lisfranc's sprain, left, subsequent encounter 01/25/2019  . Sleep disturbance 10/29/2016  . Bilateral swelling of feet 08/26/2016  . Attention deficit disorder (ADD) in adult 04/29/2016   AAmador Lee PT, DPT ADonald ProseSugg 07/09/2019, 9:29 AM  [place] [address]Phone: [phone]  Fax:  [phone] Name: NLakeva HollonMRN: [id number] of Birth: [birth year]

## 2019-07-13 ENCOUNTER — Ambulatory Visit: Admitting: Orthopaedic Surgery

## 2019-07-16 ENCOUNTER — Other Ambulatory Visit: Payer: Self-pay | Admitting: Orthopaedic Surgery

## 2019-07-16 ENCOUNTER — Encounter: Payer: Self-pay | Admitting: Orthopaedic Surgery

## 2019-07-16 ENCOUNTER — Ambulatory Visit (INDEPENDENT_AMBULATORY_CARE_PROVIDER_SITE_OTHER): Admitting: Orthopaedic Surgery

## 2019-07-16 DIAGNOSIS — M7502 Adhesive capsulitis of left shoulder: Secondary | ICD-10-CM

## 2019-07-16 DIAGNOSIS — G8929 Other chronic pain: Secondary | ICD-10-CM

## 2019-07-16 DIAGNOSIS — M25512 Pain in left shoulder: Secondary | ICD-10-CM | POA: Diagnosis not present

## 2019-07-16 MED ORDER — NABUMETONE 500 MG PO TABS: 500.0000 mg | ORAL_TABLET | Freq: Two times a day (BID) | ORAL | 1 refills | Status: DC

## 2019-07-16 NOTE — Progress Notes (Signed)
   Office Visit Note   Patient: Shirley Lee           Date of Birth: 03-Dec-1960           MRN: [id number] Visit Date: 07/16/2019              Requested by: Overton Mam, DO [address] [place],  [place] [address] PCP: Overton Mam, DO   Assessment & Plan: Visit Diagnoses:  1. Adhesive capsulitis of left shoulder   2. Chronic left shoulder pain     Plan: At this point I do not feel that she needs to repeat cortisone injection.  She will continue with physical therapy and home exercise.  Relafen was I refilled today.  We will see her back as needed.  Follow-Up Instructions: Return if symptoms worsen or fail to improve.   Orders:  No orders of the defined types were placed in this encounter.  Meds ordered this encounter  Medications  . nabumetone (RELAFEN) 500 MG tablet    Sig: Take 1 tablet (500 mg total) by mouth 2 (two) times daily.    Dispense:  60 tablet    Refill:  1      Procedures: No procedures performed   Clinical Data: No additional findings.   Subjective: Chief Complaint  Patient presents with  . Left Shoulder - Pain, Follow-up    [name] follows up for adhesive capsulitis.  She is significantly better.  Denies any significant pain.   Review of Systems   Objective: Vital Signs: LMP  (LMP Unknown)   Physical Exam  Ortho Exam Left shoulder range of motion is significantly improved.  She still has decreased internal rotation. Specialty Comments:  No specialty comments available.  Imaging: No results found.   PMFS History: Patient Active Problem List   Diagnosis Date Noted  . Adhesive capsulitis of left shoulder 05/18/2019  . Lisfranc's sprain, left, subsequent encounter 01/25/2019  . Sleep disturbance 10/29/2016  . Bilateral swelling of feet 08/26/2016  . Attention deficit disorder (ADD) in adult 04/29/2016   Past Medical History:  Diagnosis Date  . Chicken pox   . History of colon polyps     History  reviewed. No pertinent family history.  Past Surgical History:  Procedure Laterality Date  . ABDOMINAL HYSTERECTOMY  1989   Social History   Occupational History  . Not on file  Tobacco Use  . Smoking status: Never Smoker  . Smokeless tobacco: Never Used  Substance and Sexual Activity  . Alcohol use: Yes    Alcohol/week: 3.0 standard drinks    Types: 3 Glasses of wine per week  . Drug use: Never  . Sexual activity: Yes

## 2019-07-19 ENCOUNTER — Other Ambulatory Visit: Payer: Self-pay | Admitting: Orthopaedic Surgery

## 2019-07-19 DIAGNOSIS — G8929 Other chronic pain: Secondary | ICD-10-CM

## 2019-07-19 DIAGNOSIS — M7502 Adhesive capsulitis of left shoulder: Secondary | ICD-10-CM

## 2019-07-20 NOTE — Telephone Encounter (Signed)
Pls advise.  

## 2019-07-20 NOTE — Telephone Encounter (Signed)
You did on 07/17/19 but patient is requesting a 90 supply

## 2019-07-20 NOTE — Telephone Encounter (Signed)
I thought I already approved this.

## 2019-07-21 ENCOUNTER — Ambulatory Visit: Attending: Family Medicine | Admitting: Physical Therapy

## 2019-07-21 ENCOUNTER — Encounter: Payer: Self-pay | Admitting: Physical Therapy

## 2019-07-21 ENCOUNTER — Other Ambulatory Visit: Payer: Self-pay

## 2019-07-21 DIAGNOSIS — G8929 Other chronic pain: Secondary | ICD-10-CM | POA: Insufficient documentation

## 2019-07-21 DIAGNOSIS — M25612 Stiffness of left shoulder, not elsewhere classified: Secondary | ICD-10-CM | POA: Diagnosis not present

## 2019-07-21 DIAGNOSIS — M7502 Adhesive capsulitis of left shoulder: Secondary | ICD-10-CM | POA: Diagnosis present

## 2019-07-21 DIAGNOSIS — M25512 Pain in left shoulder: Secondary | ICD-10-CM | POA: Diagnosis present

## 2019-07-21 NOTE — Therapy (Signed)
[facility] [place], [place], [address] Phone: [phone]   Fax:  [phone]  Physical Therapy Treatment PHYSICAL THERAPY DISCHARGE SUMMARY   Plan: Patient agrees to discharge.  Patient goals were met. Patient is being discharged due to meeting the stated rehab goals.  ?????     Patient Details  Name: Shirley Lee MRN: [id number] Date of Birth: 07-May-1960 Referring Provider (PT): Letta Median   Encounter Date: 07/21/2019   PT End of Session - 07/21/19 0924    Visit Number 13    Date for PT Re-Evaluation 07/28/19    PT Start Time 0844    PT Stop Time 0925    PT Time Calculation (min) 41 min    Activity Tolerance Patient tolerated treatment well    Behavior During Therapy [facility] for tasks assessed/performed           Past Medical History:  Diagnosis Date  . Chicken pox   . History of colon polyps     Past Surgical History:  Procedure Laterality Date  . ABDOMINAL HYSTERECTOMY  1989    There were no vitals filed for this visit.   Subjective Assessment - 07/21/19 0852    Subjective Pt reports she is doing really well; would like to know if she needs to continue PT    Currently in Pain? No/denies    Pain Score 0-No pain    Pain Location Shoulder    Pain Orientation Left              [facility] PT Assessment - 07/21/19 0001      AROM   Left Shoulder Internal Rotation 90 Degrees    Left Shoulder External Rotation 85 Degrees                         [facility] Adult PT Treatment/Exercise - 07/21/19 0001      Shoulder Exercises: ROM/Strengthening   UBE (Upper Arm Bike) constant work x6 min    Other ROM/Strengthening Exercises rows and lats 35# 2x15,    Other ROM/Strengthening Exercises chest press 15# 2x15, shoulder ext 15# 2x10, L IR/ER 5# 2x15; bicep curls 10# 2x15; tricep ext 35# 2x15                    PT Short Term Goals - 06/08/19 0845      PT SHORT  TERM GOAL #1   Title independent with initial HEP    Status Achieved             PT Long Term Goals - 07/09/19 0914      PT LONG TERM GOAL #1   Title Pt will demonstrate functional L shoulder IR/ER equivalent to R shoulder    Status Achieved      PT LONG TERM GOAL #2   Title Pt will demonstrate AROM L shoulder abduction WFL/equivalent to R    Status Achieved      PT LONG TERM GOAL #3   Title Pt will report ability to sleep in L sidelying    Status Achieved      PT LONG TERM GOAL #4   Title Pt will report ability to don/doff LE clothing without assistance or pain    Status Achieved      PT LONG TERM GOAL #5   Title Pt will report ability to perform functional ADLs with no increase in pain    Status Achieved  Plan - 07/21/19 0924    Clinical Impression Statement Pt has met all goals and reports no functional deficits remaining; pt reports she cleaned out a garage and did a lot of housework over the weekend with no increase in L shoulder problems. Pt demonstrates L shoulder ROM equivalent to R shoulder. Pt has mild functional ROM deficits in B shoulder IR L>R. Pt educated on HEP and continued stretching into IR. Pt recommended for d/c and educated on gym maintenance and when to return if symptoms recur with pt verbalized understanding/agreement.    PT Next Visit Plan recommended for d/c    Consulted and Agree with Plan of Care Patient           Patient will benefit from skilled therapeutic intervention in order to improve the following deficits and impairments:     Visit Diagnosis: Stiffness of left shoulder, not elsewhere classified  Chronic left shoulder pain  Adhesive capsulitis of left shoulder     Problem List Patient Active Problem List   Diagnosis Date Noted  . Adhesive capsulitis of left shoulder 05/18/2019  . Lisfranc's sprain, left, subsequent encounter 01/25/2019  . Sleep disturbance 10/29/2016  . Bilateral swelling of feet  08/26/2016  . Attention deficit disorder (ADD) in adult 04/29/2016   Amador Cunas, PT, DPT Donald [facility]se [facility] Schoch 07/21/2019, 9:27 AM  [place] [place] [employer] [place] [employer], [place], [address] Phone: [phone]   Fax:  [phone]  Name: Shirley Lee MRN: [id number] Date of Birth: [birth year]

## 2019-07-23 ENCOUNTER — Other Ambulatory Visit: Payer: Self-pay | Admitting: Family Medicine

## 2019-07-23 DIAGNOSIS — F988 Other specified behavioral and emotional disorders with onset usually occurring in childhood and adolescence: Secondary | ICD-10-CM

## 2019-07-23 MED ORDER — AMPHETAMINE-DEXTROAMPHET ER 30 MG PO CP24: 30.0000 mg | ORAL_CAPSULE | Freq: Every day | ORAL | 0 refills | Status: DC

## 2019-07-23 NOTE — Telephone Encounter (Signed)
Requesting:adderall  Contract:no UDS:yes 03/19/19 Last OV:05/13/19 Next OV:n/a Last Refill:06/03/19  #30-0rf Database:   Please advise

## 2019-08-02 ENCOUNTER — Other Ambulatory Visit: Payer: Self-pay

## 2019-08-02 ENCOUNTER — Encounter: Payer: Self-pay | Admitting: Family

## 2019-08-02 ENCOUNTER — Telehealth (INDEPENDENT_AMBULATORY_CARE_PROVIDER_SITE_OTHER): Admitting: Family

## 2019-08-02 VITALS — Ht 64.0 in | Wt 140.0 lb

## 2019-08-02 DIAGNOSIS — B9689 Other specified bacterial agents as the cause of diseases classified elsewhere: Secondary | ICD-10-CM

## 2019-08-02 DIAGNOSIS — J019 Acute sinusitis, unspecified: Secondary | ICD-10-CM

## 2019-08-02 MED ORDER — FLUTICASONE PROPIONATE 50 MCG/ACT NA SUSP: 2.0000 | Freq: Every day | NASAL | 6 refills | Status: DC

## 2019-08-02 MED ORDER — DOXYCYCLINE HYCLATE 100 MG PO TABS: 100.0000 mg | ORAL_TABLET | Freq: Two times a day (BID) | ORAL | 0 refills | Status: DC

## 2019-08-02 NOTE — Progress Notes (Signed)
   Virtual Visit via Video   I connected with patient on 08/02/19 at  3:40 PM EDT by a video enabled telemedicine application and verified that I am speaking with the correct person using two identifiers.  Location patient: Home Location provider: [facility], Office Persons participating in the virtual visit: Patient, Provider, CMA  I discussed the limitations of evaluation and management by telemedicine and the availability of in person appointments. The patient expressed understanding and agreed to proceed.  Subjective:   HPI:   59 year old female is in today with c/o sinus pressure and pain, congestion, cough and drainage x 5 days and worsening. She has been taking Dayquil and Nyquil w/o much relief. She denies fever  ROS:   See pertinent positives and negatives per HPI.  Patient Active Problem List   Diagnosis Date Noted  . Adhesive capsulitis of left shoulder 05/18/2019  . Lisfranc's sprain, left, subsequent encounter 01/25/2019  . Sleep disturbance 10/29/2016  . Bilateral swelling of feet 08/26/2016  . Attention deficit disorder (ADD) in adult 04/29/2016    Social History   Tobacco Use  . Smoking status: Never Smoker  . Smokeless tobacco: Never Used  Substance Use Topics  . Alcohol use: Yes    Alcohol/week: 3.0 standard drinks    Types: 3 Glasses of wine per week    Current Outpatient Medications:  .  albuterol (PROVENTIL HFA;VENTOLIN HFA) 108 (90 Base) MCG/ACT inhaler, Inhale 2 puffs into the lungs every 6 (six) hours as needed for wheezing or shortness of breath., Disp: 1 Inhaler, Rfl: 2 .  amphetamine-dextroamphetamine (ADDERALL XR) 30 MG 24 hr capsule, Take 1 capsule (30 mg total) by mouth daily., Disp: 30 capsule, Rfl: 0 .  nabumetone (RELAFEN) 500 MG tablet, TAKE 1 TABLET(500 MG) BY MOUTH TWICE DAILY, Disp: 180 tablet, Rfl: 0 .  cyclobenzaprine (FLEXERIL) 5 MG tablet, Take 1 tablet (5 mg total) by mouth 2 (two) times daily as needed for muscle spasms.  (Patient not taking: Reported on 08/02/2019), Disp: 60 tablet, Rfl: 2 .  doxycycline (VIBRA-TABS) 100 MG tablet, Take 1 tablet (100 mg total) by mouth 2 (two) times daily., Disp: 20 tablet, Rfl: 0 .  fluticasone (FLONASE) 50 MCG/ACT nasal spray, Place 2 sprays into both nostrils daily., Disp: 16 g, Rfl: 6  Allergies  Allergen Reactions  . Penicillins Other (See Comments)    Childhood allergy unknown of sypmtoms    Objective:   Ht 5\' 4"  (1.626 m)   Wt 140 lb (63.5 kg) Comment: pt reported  LMP  (LMP Unknown)   BMI 24.03 kg/m   Patient is well-developed, well-nourished in no acute distress.  Resting comfortably at home.  Head is normocephalic, atraumatic.  No labored breathing.  Speech is clear and coherent with logical content.  Patient is alert and oriented at baseline.   Assessment and Plan:    Carlisia was seen today for sinusitis.  Diagnoses and all orders for this visit:  Acute bacterial sinusitis  Other orders -     doxycycline (VIBRA-TABS) 100 MG tablet; Take 1 tablet (100 mg total) by mouth 2 (two) times daily. -     fluticasone (FLONASE) 50 MCG/ACT nasal spray; Place 2 sprays into both nostrils daily.   Call the office with any questions or concerns. Recheck as scheduled and sooner as needed  Gloriajean Dell, Eulis Foster 08/02/2019

## 2019-08-02 NOTE — Patient Instructions (Signed)

## 2019-08-31 ENCOUNTER — Other Ambulatory Visit: Payer: Self-pay | Admitting: Family Medicine

## 2019-08-31 DIAGNOSIS — F988 Other specified behavioral and emotional disorders with onset usually occurring in childhood and adolescence: Secondary | ICD-10-CM

## 2019-09-03 MED ORDER — AMPHETAMINE-DEXTROAMPHET ER 30 MG PO CP24: 30.0000 mg | ORAL_CAPSULE | Freq: Every day | ORAL | 0 refills | Status: DC

## 2019-09-03 NOTE — Telephone Encounter (Signed)
Med refilled. Pt needs appt next month - VV or in-person

## 2019-09-03 NOTE — Telephone Encounter (Signed)
Patient notified and verbalized understanding.  Pt states she will call back to schedule appt.  

## 2019-09-03 NOTE — Telephone Encounter (Signed)
Last f/u office visit 03/19/19 Chart supports use of Rx No f/u appts scheduled

## 2019-10-26 ENCOUNTER — Other Ambulatory Visit: Payer: Self-pay

## 2019-10-27 ENCOUNTER — Ambulatory Visit (INDEPENDENT_AMBULATORY_CARE_PROVIDER_SITE_OTHER): Admitting: Family Medicine

## 2019-10-27 ENCOUNTER — Encounter: Payer: Self-pay | Admitting: Family Medicine

## 2019-10-27 VITALS — BP 120/70 | HR 74 | Temp 98.4°F | Ht 64.0 in | Wt 146.0 lb

## 2019-10-27 DIAGNOSIS — F988 Other specified behavioral and emotional disorders with onset usually occurring in childhood and adolescence: Secondary | ICD-10-CM | POA: Diagnosis not present

## 2019-10-27 MED ORDER — AMPHETAMINE-DEXTROAMPHET ER 30 MG PO CP24: 30.0000 mg | ORAL_CAPSULE | Freq: Every day | ORAL | 0 refills | Status: DC

## 2019-10-27 NOTE — Progress Notes (Signed)
Shirley Lee is a 59 y.o. female  Chief Complaint  Patient presents with  . Follow-up    f/u ADHD/meds.     HPI: Shirley Lee is a 59 y.o. female who is seen today for routine ADD f/u and refill of her Adderall XR 30mg  daily. She states med is effective at current dose. No side effects. No concerns. Appetite is good. Sleep is unchanged. Denies HA, CP, SOB, palpitations.   Past Medical History:  Diagnosis Date  . Chicken pox   . History of colon polyps     Past Surgical History:  Procedure Laterality Date  . ABDOMINAL HYSTERECTOMY  1989    Social History   Socioeconomic History  . Marital status: Married    Spouse name: Not on file  . Number of children: Not on file  . Years of education: Not on file  . Highest education level: Not on file  Occupational History  . Not on file  Tobacco Use  . Smoking status: Never Smoker  . Smokeless tobacco: Never Used  Substance and Sexual Activity  . Alcohol use: Yes    Alcohol/week: 3.0 standard drinks    Types: 3 Glasses of wine per week  . Drug use: Never  . Sexual activity: Yes  Other Topics Concern  . Not on file  Social History Narrative  . Not on file   Social Determinants of Health   Financial Resource Strain:   . Difficulty of Paying Living Expenses: Not on file  Food Insecurity:   . Worried About in the Last Year: Not on file  . Ran Out of Food in the Last Year: Not on file  Transportation Needs:   . Lack of Transportation (Medical): Not on file  . Lack of Transportation (Non-Medical): Not on file  Physical Activity:   . Days of Exercise per Week: Not on file  . Minutes of Exercise per Session: Not on file  Stress:   . Feeling of Stress : Not on file  Social Connections:   . Frequency of Communication with Friends and Family: Not on file  . Frequency of Social Gatherings with Friends and Family: Not on file  . Attends Religious Services: Not on file  . Active Member of  Clubs or Organizations: Not on file  . Attends Programme researcher, broadcasting/film/video Meetings: Not on file  . Marital Status: Not on file  Intimate Partner Violence:   . Fear of Current or Ex-Partner: Not on file  . Emotionally Abused: Not on file  . Physically Abused: Not on file  . Sexually Abused: Not on file    No family history on file.   Immunization History  Administered Date(s) Administered  . Influenza-Unspecified 10/14/2017  . PFIZER SARS-COV-2 Vaccination 04/01/2019, 05/02/2019    Outpatient Encounter Medications as of 10/27/2019  Medication Sig  . albuterol (PROVENTIL HFA;VENTOLIN HFA) 108 (90 Base) MCG/ACT inhaler Inhale 2 puffs into the lungs every 6 (six) hours as needed for wheezing or shortness of breath.  . fluticasone (FLONASE) 50 MCG/ACT nasal spray Place 2 sprays into both nostrils daily.  . [DISCONTINUED] amphetamine-dextroamphetamine (ADDERALL XR) 30 MG 24 hr capsule Take 1 capsule (30 mg total) by mouth daily.  10/29/2019 amphetamine-dextroamphetamine (ADDERALL XR) 30 MG 24 hr capsule Take 1 capsule (30 mg total) by mouth daily.  Marland Kitchen doxycycline (VIBRA-TABS) 100 MG tablet Take 1 tablet (100 mg total) by mouth 2 (two) times daily. (Patient not taking: Reported on 10/27/2019)  .  nabumetone (RELAFEN) 500 MG tablet TAKE 1 TABLET(500 MG) BY MOUTH TWICE DAILY (Patient not taking: Reported on 10/27/2019)  . [DISCONTINUED] amphetamine-dextroamphetamine (ADDERALL XR) 30 MG 24 hr capsule Take 1 capsule (30 mg total) by mouth daily.  . [DISCONTINUED] amphetamine-dextroamphetamine (ADDERALL XR) 30 MG 24 hr capsule Take 1 capsule (30 mg total) by mouth daily.  . [DISCONTINUED] cyclobenzaprine (FLEXERIL) 5 MG tablet Take 1 tablet (5 mg total) by mouth 2 (two) times daily as needed for muscle spasms. (Patient not taking: Reported on 08/02/2019)   No facility-administered encounter medications on file as of 10/27/2019.     ROS: Pertinent positives and negatives noted in HPI. Remainder of ROS  non-contributory   Allergies  Allergen Reactions  . Penicillins Other (See Comments)    Childhood allergy unknown of sypmtoms    BP 120/70   Pulse 74   Temp 98.4 F (36.9 C) (Temporal)   Ht 5\' 4"  (1.626 m)   Wt 146 lb (66.2 kg)   LMP  (LMP Unknown)   SpO2 98%   BMI 25.06 kg/m   Physical Exam Constitutional:      General: She is not in acute distress.    Appearance: Normal appearance. She is not ill-appearing.  Cardiovascular:     Rate and Rhythm: Normal rate and regular rhythm.     Pulses: Normal pulses.     Heart sounds: Normal heart sounds.  Pulmonary:     Effort: Pulmonary effort is normal. No respiratory distress.     Breath sounds: Normal breath sounds.  Neurological:     Mental Status: She is alert and oriented to person, place, and time.  Psychiatric:        Mood and Affect: Mood normal.        Behavior: Behavior normal.      A/P:  1. 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  Dispense: 30 capsule; Refill: 0 - stable, controlled - UDS UTD, controlled substance agreement on file - database reviewed and appropriate Refill: - f/u in 65mo as pt has been stable on med/dose for > 1 year and is compliant with controlled substance protocols  Discussed plan and reviewed medications with patient, including risks, benefits, and potential side effects. Pt expressed understand. All questions answered.  This visit occurred during the SARS-CoV-2 public health emergency.  Safety protocols were in place, including screening questions prior to the visit, additional usage of staff PPE, and extensive cleaning of exam room while observing appropriate contact time as indicated for disinfecting solutions.

## 2019-12-22 ENCOUNTER — Other Ambulatory Visit: Payer: Self-pay | Admitting: Family Medicine

## 2019-12-22 DIAGNOSIS — F988 Other specified behavioral and emotional disorders with onset usually occurring in childhood and adolescence: Secondary | ICD-10-CM

## 2019-12-22 MED ORDER — AMPHETAMINE-DEXTROAMPHET ER 30 MG PO CP24: 30.0000 mg | ORAL_CAPSULE | Freq: Every day | ORAL | 0 refills | Status: DC

## 2019-12-22 NOTE — Telephone Encounter (Signed)
Last OV 10/27/19 °Last fill 10/27/19  30/0 °

## 2020-01-08 IMAGING — CR DG FOOT COMPLETE 3+V*L*
3 series · 3 of 3 positions shown · non-contrast
Comparison: None.

CLINICAL DATA: Left foot pain and swelling following twisting
injury earlier today.

EXAM:
LEFT FOOT - COMPLETE 3+ VIEW

[x foot ap left]
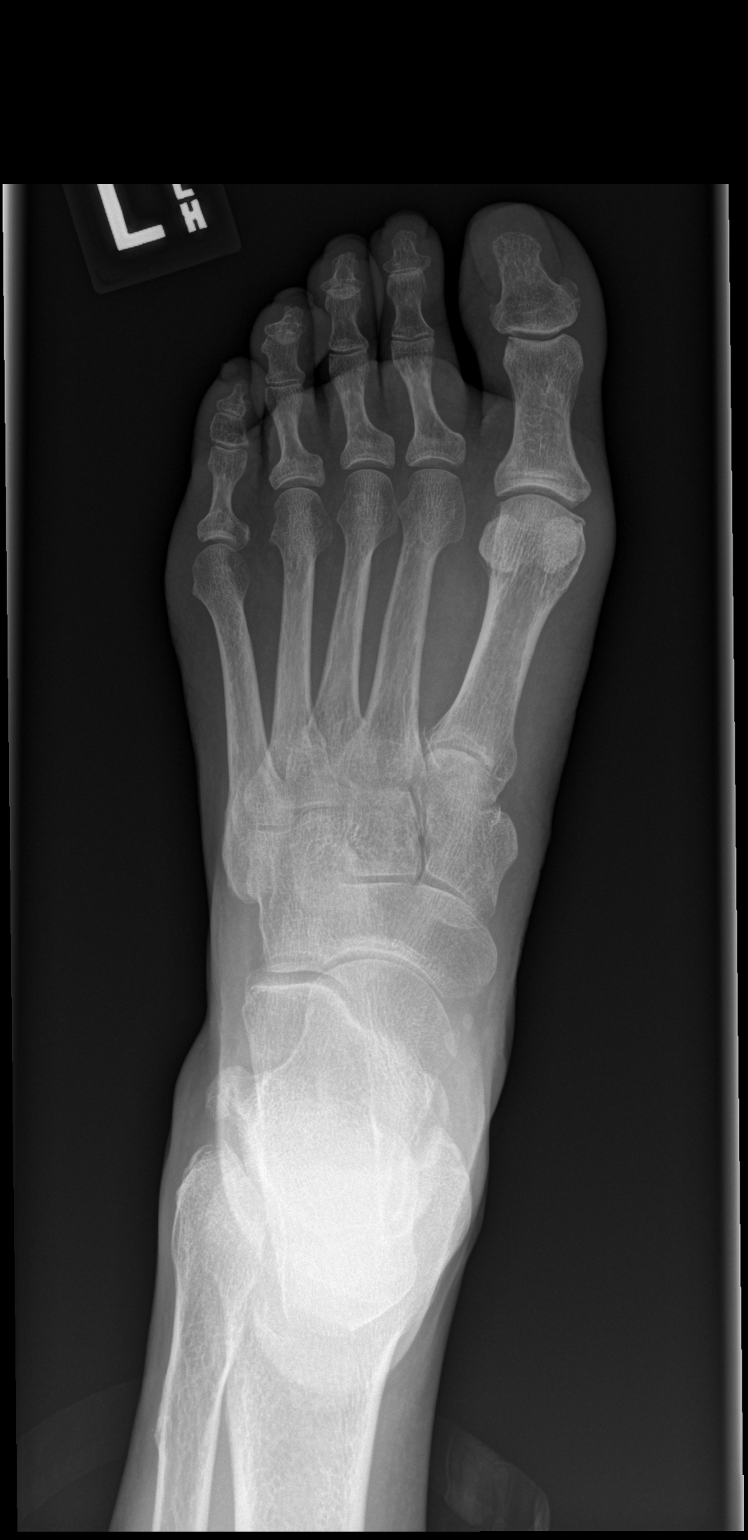

[x foot obl left]
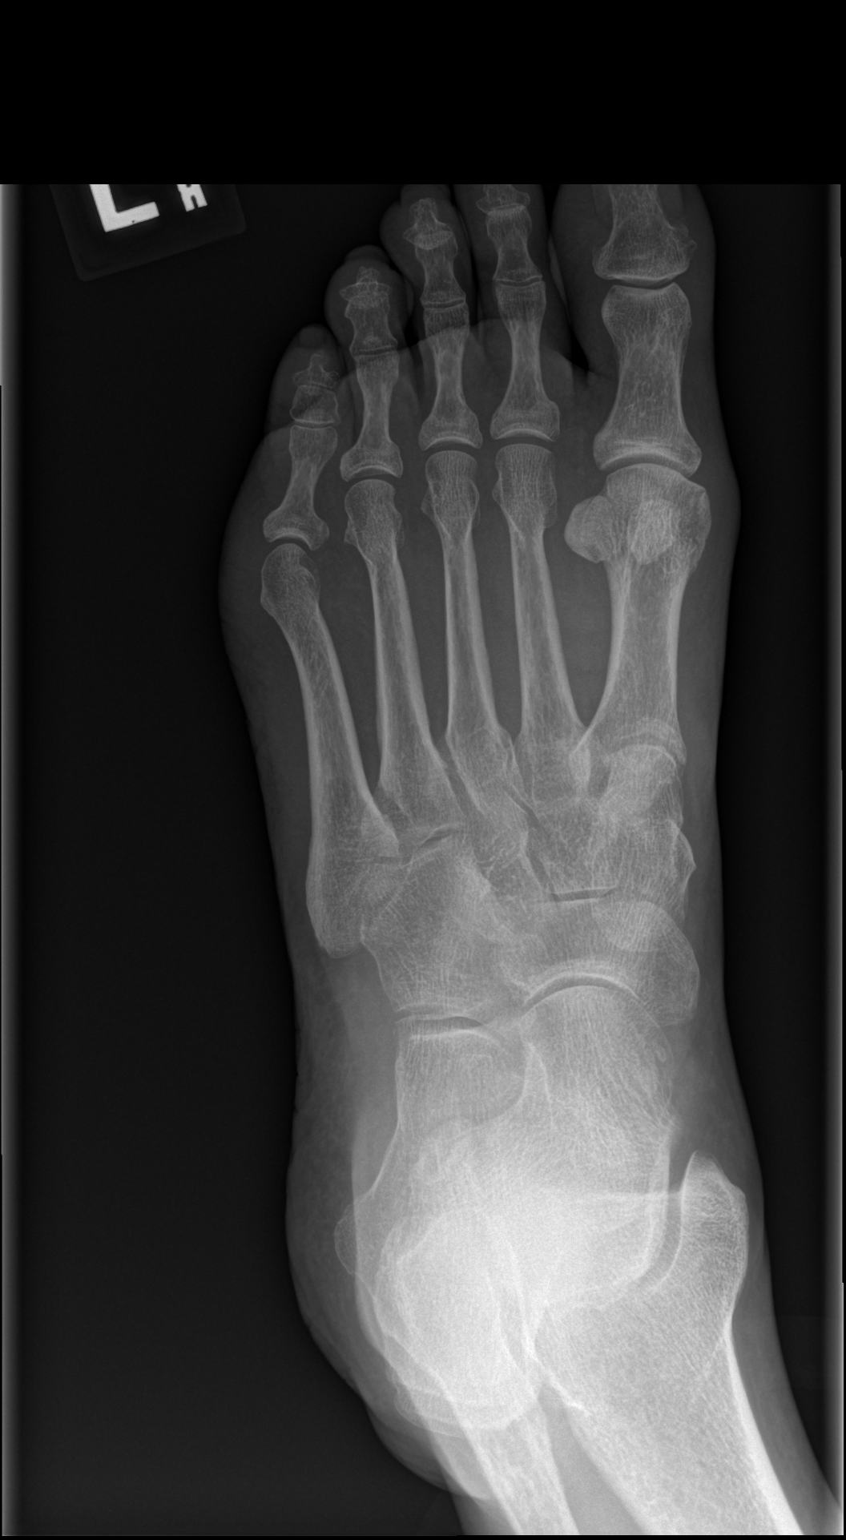

[x foot lat left]
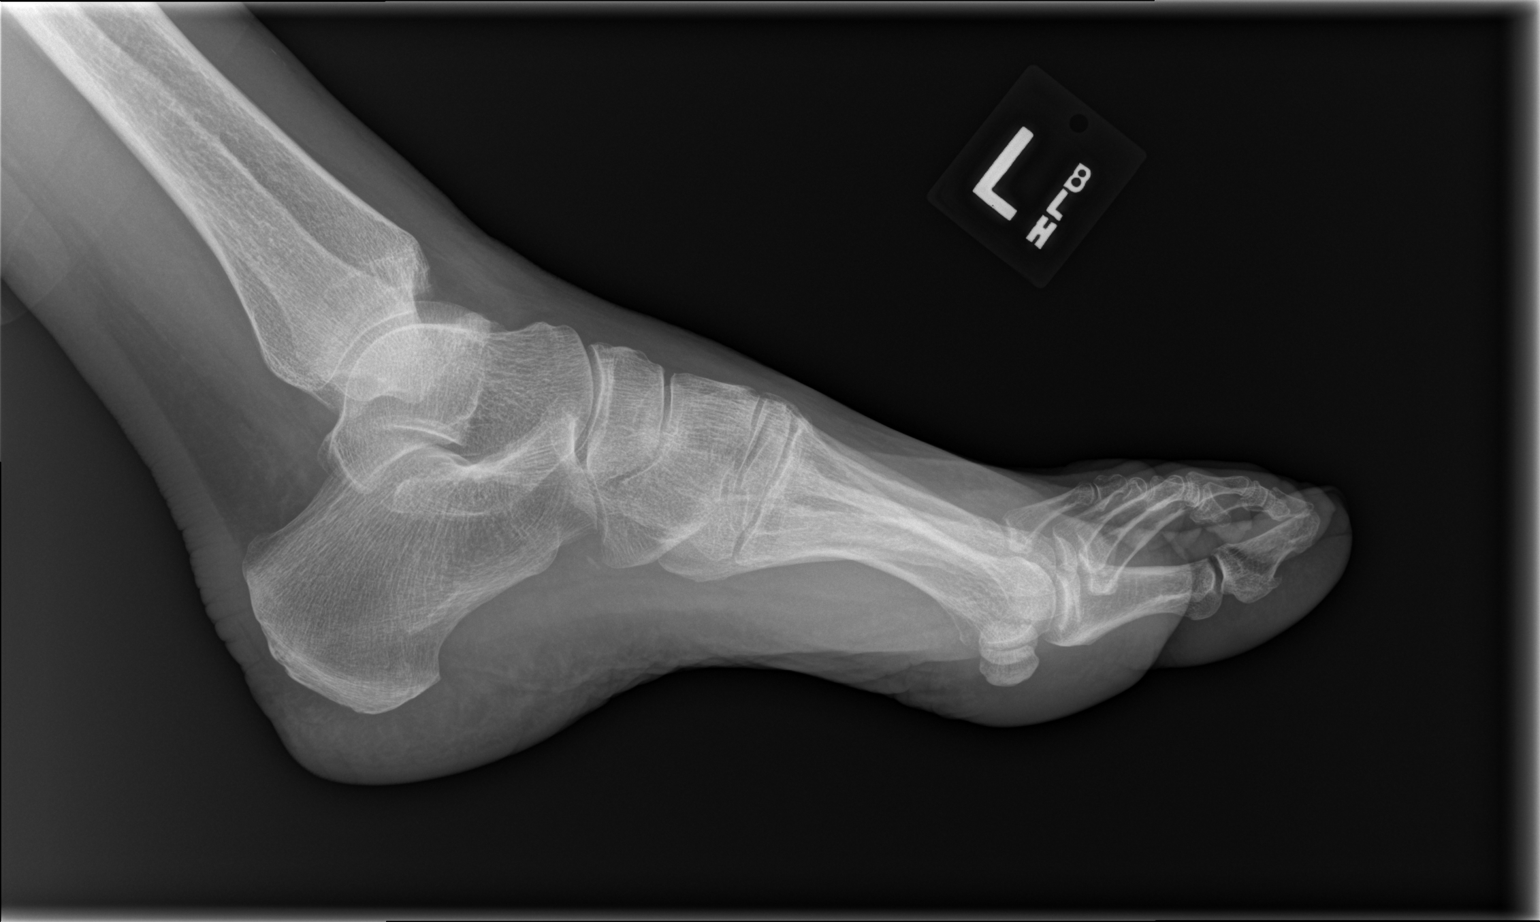

[3 of 3 positions shown; findings below may reference images not displayed]

FINDINGS: The mineralization and alignment are normal. There is no evidence of
acute fracture or dislocation. Minimal degenerative changes at the
1st MTP joint. No foreign body or focal soft tissue swelling
identified.
IMPRESSION: No acute osseous findings. Minimal degenerative changes at the 1st
MTP joint.

## 2020-02-04 ENCOUNTER — Other Ambulatory Visit: Payer: Self-pay | Admitting: Family Medicine

## 2020-02-04 DIAGNOSIS — F988 Other specified behavioral and emotional disorders with onset usually occurring in childhood and adolescence: Secondary | ICD-10-CM

## 2020-02-08 MED ORDER — AMPHETAMINE-DEXTROAMPHET ER 30 MG PO CP24: 30.0000 mg | ORAL_CAPSULE | Freq: Every day | ORAL | 0 refills | Status: DC

## 2020-03-27 ENCOUNTER — Telehealth: Payer: Self-pay | Admitting: Family Medicine

## 2020-03-27 DIAGNOSIS — F988 Other specified behavioral and emotional disorders with onset usually occurring in childhood and adolescence: Secondary | ICD-10-CM

## 2020-03-27 NOTE — Telephone Encounter (Signed)
Refill request for   Adderrall XR 30 mg  LR 02/08/20, # 30, 0 rf LOV 10/27/19 FOV  None scheduled.    Please review and advise.  Thanks.  Dm/cma

## 2020-03-29 MED ORDER — AMPHETAMINE-DEXTROAMPHET ER 30 MG PO CP24: 30.0000 mg | ORAL_CAPSULE | Freq: Every day | ORAL | 0 refills | Status: DC

## 2020-03-30 NOTE — Telephone Encounter (Signed)
Patient is calling to check the status of her refill request. Please call her and update on status.

## 2020-03-30 NOTE — Telephone Encounter (Signed)
Pt informed that medication was sent in yesterday.

## 2020-05-11 ENCOUNTER — Other Ambulatory Visit: Payer: Self-pay | Admitting: Family Medicine

## 2020-05-11 DIAGNOSIS — F988 Other specified behavioral and emotional disorders with onset usually occurring in childhood and adolescence: Secondary | ICD-10-CM

## 2020-05-11 NOTE — Telephone Encounter (Signed)
Refill request for  Adderall 30 mg LR 03/29/20, #30, 0 rf LOV 10/27/19 FOV 05/18/20  Please review and advise.  Thanks.  Dm/cma

## 2020-05-12 MED ORDER — AMPHETAMINE-DEXTROAMPHET ER 30 MG PO CP24: 30.0000 mg | ORAL_CAPSULE | Freq: Every day | ORAL | 0 refills | Status: DC

## 2020-05-18 ENCOUNTER — Encounter: Payer: Self-pay | Admitting: Family Medicine

## 2020-05-18 ENCOUNTER — Other Ambulatory Visit: Payer: Self-pay

## 2020-05-18 ENCOUNTER — Ambulatory Visit (INDEPENDENT_AMBULATORY_CARE_PROVIDER_SITE_OTHER): Admitting: Family Medicine

## 2020-05-18 VITALS — BP 100/68 | HR 95 | Temp 98.3°F | Ht 64.0 in | Wt 148.4 lb

## 2020-05-18 DIAGNOSIS — M542 Cervicalgia: Secondary | ICD-10-CM | POA: Diagnosis not present

## 2020-05-18 DIAGNOSIS — Z1211 Encounter for screening for malignant neoplasm of colon: Secondary | ICD-10-CM | POA: Diagnosis not present

## 2020-05-18 DIAGNOSIS — R140 Abdominal distension (gaseous): Secondary | ICD-10-CM

## 2020-05-18 DIAGNOSIS — G4700 Insomnia, unspecified: Secondary | ICD-10-CM

## 2020-05-18 MED ORDER — TRAZODONE HCL 50 MG PO TABS: 50.0000 mg | ORAL_TABLET | Freq: Every evening | ORAL | 1 refills | Status: DC | PRN

## 2020-05-18 MED ORDER — NABUMETONE 500 MG PO TABS: 500.0000 mg | ORAL_TABLET | Freq: Two times a day (BID) | ORAL | 2 refills | Status: DC | PRN

## 2020-05-18 MED ORDER — CYCLOBENZAPRINE HCL 5 MG PO TABS: 5.0000 mg | ORAL_TABLET | Freq: Three times a day (TID) | ORAL | 2 refills | Status: DC | PRN

## 2020-05-18 NOTE — Patient Instructions (Addendum)
Align or any probiotic (acidophilus or lactobacillus)     Neck Exercises Ask your health care provider which exercises are safe for you. Do exercises exactly as told by your health care provider and adjust them as directed. It is normal to feel mild stretching, pulling, tightness, or discomfort as you do these exercises. Stop right away if you feel sudden pain or your pain gets worse. Do not begin these exercises until told by your health care provider. Neck exercises can be important for many reasons. They can improve strength and maintain flexibility in your neck, which will help your upper back and prevent neck pain. Stretching exercises Rotation neck stretching 1. Sit in a chair or stand up. 2. Place your feet flat on the floor, shoulder width apart. 3. Slowly turn your head (rotate) to the right until a slight stretch is felt. Turn it all the way to the right so you can look over your right shoulder. Do not tilt or tip your head. 4. Hold this position for 10-30 seconds. 5. Slowly turn your head (rotate) to the left until a slight stretch is felt. Turn it all the way to the left so you can look over your left shoulder. Do not tilt or tip your head. 6. Hold this position for 10-30 seconds. Repeat __________ times. Complete this exercise __________ times a day.   Neck retraction 1. Sit in a sturdy chair or stand up. 2. Look straight ahead. Do not bend your neck. 3. Use your fingers to push your chin backward (retraction). Do not bend your neck for this movement. Continue to face straight ahead. If you are doing the exercise properly, you will feel a slight sensation in your throat and a stretch at the back of your neck. 4. Hold the stretch for 1-2 seconds. Repeat __________ times. Complete this exercise __________ times a day. Strengthening exercises Neck press 1. Lie on your back on a firm bed or on the floor with a pillow under your head. 2. Use your neck muscles to push your head down  on the pillow and straighten your spine. 3. Hold the position as well as you can. Keep your head facing up (in a neutral position) and your chin tucked. 4. Slowly count to 5 while holding this position. Repeat __________ times. Complete this exercise __________ times a day. Isometrics These are exercises in which you strengthen the muscles in your neck while keeping your neck still (isometrics). 1. Sit in a supportive chair and place your hand on your forehead. 2. Keep your head and face facing straight ahead. Do not flex or extend your neck while doing isometrics. 3. Push forward with your head and neck while pushing back with your hand. Hold for 10 seconds. 4. Do the sequence again, this time putting your hand against the back of your head. Use your head and neck to push backward against the hand pressure. 5. Finally, do the same exercise on either side of your head, pushing sideways against the pressure of your hand. Repeat __________ times. Complete this exercise __________ times a day. Prone head lifts 1. Lie face-down (prone position), resting on your elbows so that your chest and upper back are raised. 2. Start with your head facing downward, near your chest. Position your chin either on or near your chest. 3. Slowly lift your head upward. Lift until you are looking straight ahead. Then continue lifting your head as far back as you can comfortably stretch. 4. Hold your head up for  5 seconds. Then slowly lower it to your starting position. Repeat __________ times. Complete this exercise __________ times a day. Supine head lifts 1. Lie on your back (supine position), bending your knees to point to the ceiling and keeping your feet flat on the floor. 2. Lift your head slowly off the floor, raising your chin toward your chest. 3. Hold for 5 seconds. Repeat __________ times. Complete this exercise __________ times a day. Scapular retraction 1. Stand with your arms at your sides. Look  straight ahead. 2. Slowly pull both shoulders (scapulae) backward and downward (retraction) until you feel a stretch between your shoulder blades in your upper back. 3. Hold for 10-30 seconds. 4. Relax and repeat. Repeat __________ times. Complete this exercise __________ times a day. Contact a health care provider if:  Your neck pain or discomfort gets much worse when you do an exercise.  Your neck pain or discomfort does not improve within 2 hours after you exercise. If you have any of these problems, stop exercising right away. Do not do the exercises again unless your health care provider says that you can. Get help right away if:  You develop sudden, severe neck pain. If this happens, stop exercising right away. Do not do the exercises again unless your health care provider says that you can. This information is not intended to replace advice given to you by your health care provider. Make sure you discuss any questions you have with your health care provider. Document Revised: 10/29/2017 Document Reviewed: 10/29/2017 Elsevier Patient Education  2021 [employer].

## 2020-05-18 NOTE — Progress Notes (Signed)
Shirley Lee is a 60 y.o. female  Chief Complaint  Patient presents with  . Neck Pain    Pt c/o neck pain, not able to sleep through the night, x 2 weeks ago. Pt has been using something over the counter with no relief, pt said that she is out of the Flexeril.    HPI: Shirley Lee is a 60 y.o. female patient who complains of 2 week h/o neck pain that is interfering with her sleep. She used relafen and got it "under control" She has tried OTC med w/o relief. She has used flexeril in the past but ran out and therefore has not been taking it.  No UE numbness, tingling.   She complains of lower abdominal bloating and increased flatus. No other symptoms. Normal BMs. Not associated with eating/food. No changes in diet. No pain. S/p partial hysterectomy in 20's. She is due for colonoscopy.  Pt has chronic issues with sleep. Sleep is broken, few hours at a time, up for hours.   Past Medical History:  Diagnosis Date  . Chicken pox   . History of colon polyps     Past Surgical History:  Procedure Laterality Date  . ABDOMINAL HYSTERECTOMY  1989    Social History   Socioeconomic History  . Marital status: Married    Spouse name: Not on file  . Number of children: Not on file  . Years of education: Not on file  . Highest education level: Not on file  Occupational History  . Not on file  Tobacco Use  . Smoking status: Never Smoker  . Smokeless tobacco: Never Used  Substance and Sexual Activity  . Alcohol use: Yes    Alcohol/week: 3.0 standard drinks    Types: 3 Glasses of wine per week  . Drug use: Never  . Sexual activity: Yes  Other Topics Concern  . Not on file  Social History Narrative  . Not on file   Social Determinants of Health   Financial Resource Strain: Not on file  Food Insecurity: Not on file  Transportation Needs: Not on file  Physical Activity: Not on file  Stress: Not on file  Social Connections: Not on file  Intimate Partner Violence:  Not on file    History reviewed. No pertinent family history.   Immunization History  Administered Date(s) Administered  . Influenza-Unspecified 10/14/2017  . PFIZER(Purple Top)SARS-COV-2 Vaccination 04/01/2019, 05/02/2019    Outpatient Encounter Medications as of 05/18/2020  Medication Sig  . albuterol (PROVENTIL HFA;VENTOLIN HFA) 108 (90 Base) MCG/ACT inhaler Inhale 2 puffs into the lungs every 6 (six) hours as needed for wheezing or shortness of breath.  . amphetamine-dextroamphetamine (ADDERALL XR) 30 MG 24 hr capsule Take 1 capsule (30 mg total) by mouth daily.  . fluticasone (FLONASE) 50 MCG/ACT nasal spray Place 2 sprays into both nostrils daily.  . cyclobenzaprine (FLEXERIL) 5 MG tablet Take 1 tablet (5 mg total) by mouth 3 (three) times daily as needed.  . nabumetone (RELAFEN) 500 MG tablet Take 1 tablet (500 mg total) by mouth 2 (two) times daily as needed.  . [DISCONTINUED] cyclobenzaprine (FLEXERIL) 5 MG tablet Take 1 tablet by mouth 3 (three) times daily as needed. (Patient not taking: Reported on 05/18/2020)  . [DISCONTINUED] doxycycline (VIBRA-TABS) 100 MG tablet Take 1 tablet (100 mg total) by mouth 2 (two) times daily. (Patient not taking: Reported on 10/27/2019)  . [DISCONTINUED] nabumetone (RELAFEN) 500 MG tablet TAKE 1 TABLET(500 MG) BY MOUTH TWICE DAILY (Patient  not taking: Reported on 10/27/2019)   No facility-administered encounter medications on file as of 05/18/2020.     ROS: Pertinent positives and negatives noted in HPI. Remainder of ROS non-contributory   Allergies  Allergen Reactions  . Penicillins Other (See Comments)    Childhood allergy unknown of sypmtoms    BP 100/68 (BP Location: Left Arm, Patient Position: Sitting, Cuff Size: Normal)   Pulse 95   Temp 98.3 F (36.8 C) (Oral)   Ht 5\' 4"  (1.626 m)   Wt 148 lb 6.4 oz (67.3 kg)   LMP  (LMP Unknown)   SpO2 96%   BMI 25.47 kg/m   Wt Readings from Last 3 Encounters:  05/18/20 148 lb 6.4 oz (67.3  kg)  10/27/19 146 lb (66.2 kg)  08/02/19 140 lb (63.5 kg)   Temp Readings from Last 3 Encounters:  05/18/20 98.3 F (36.8 C) (Oral)  10/27/19 98.4 F (36.9 C) (Temporal)  05/13/19 98.5 F (36.9 C) (Temporal)   BP Readings from Last 3 Encounters:  05/18/20 100/68  10/27/19 120/70  05/13/19 120/70   Pulse Readings from Last 3 Encounters:  05/18/20 95  10/27/19 74  05/13/19 97     Physical Exam Constitutional:      General: She is not in acute distress.    Appearance: Normal appearance. She is not ill-appearing.  Pulmonary:     Effort: No respiratory distress.  Abdominal:     General: Bowel sounds are normal. There is distension (mild).     Palpations: There is no mass.     Tenderness: There is no abdominal tenderness. There is no guarding or rebound.  Musculoskeletal:     Cervical back: Normal range of motion.  Neurological:     Mental Status: She is alert and oriented to person, place, and time.  Psychiatric:        Mood and Affect: Mood normal.        Behavior: Behavior normal.      A/P:  1. Musculoskeletal neck pain - heating pad BID-TID - exercises (included in AVS) Refill: - nabumetone (RELAFEN) 500 MG tablet; Take 1 tablet (500 mg total) by mouth 2 (two) times daily as needed.  Dispense: 60 tablet; Refill: 2 - cyclobenzaprine (FLEXERIL) 5 MG tablet; Take 1 tablet (5 mg total) by mouth 3 (three) times daily as needed.  Dispense: 30 tablet; Refill: 2  2. Screening for colon cancer - Ambulatory referral to Gastroenterology  3. Insomnia, unspecified type Rx: - traZODone (DESYREL) 50 MG tablet; Take 1 tablet (50 mg total) by mouth at bedtime as needed for sleep.  Dispense: 30 tablet; Refill: 1  4. Abdominal bloating - lower abdominal bloating and increased flatus. No other symptoms. No dietary changes - 4wk trial of probiotic and if no improvement at that time, will get abd/pelvic 05/15/19    This visit occurred during the SARS-CoV-2 public health emergency.   Safety protocols were in place, including screening questions prior to the visit, additional usage of staff PPE, and extensive cleaning of exam room while observing appropriate contact time as indicated for disinfecting solutions.

## 2020-06-07 ENCOUNTER — Other Ambulatory Visit: Payer: Self-pay | Admitting: Family Medicine

## 2020-06-07 DIAGNOSIS — F988 Other specified behavioral and emotional disorders with onset usually occurring in childhood and adolescence: Secondary | ICD-10-CM

## 2020-06-07 MED ORDER — AMPHETAMINE-DEXTROAMPHET ER 30 MG PO CP24: 30.0000 mg | ORAL_CAPSULE | Freq: Every day | ORAL | 0 refills | Status: DC

## 2020-06-07 NOTE — Telephone Encounter (Signed)
Refill request for: Adderall  LR 05/12/20, #30, 0 rf LOV 05/18/20 FOV none scheduled.   Please review and advise.  Thanks. Dm/cma

## 2020-07-20 ENCOUNTER — Other Ambulatory Visit: Payer: Self-pay | Admitting: Family Medicine

## 2020-07-20 DIAGNOSIS — F988 Other specified behavioral and emotional disorders with onset usually occurring in childhood and adolescence: Secondary | ICD-10-CM

## 2020-07-21 MED ORDER — AMPHETAMINE-DEXTROAMPHET ER 30 MG PO CP24: 30.0000 mg | ORAL_CAPSULE | Freq: Every day | ORAL | 0 refills | Status: DC

## 2020-07-21 NOTE — Telephone Encounter (Signed)
Last OV 05/18/20 Last fill 06/07/20  #30/0 Please see message and advise.  Thank you.

## 2020-07-24 ENCOUNTER — Telehealth: Payer: TRICARE For Life (TFL) | Admitting: Physician Assistant

## 2020-07-24 ENCOUNTER — Encounter: Payer: Self-pay | Admitting: Physician Assistant

## 2020-07-24 DIAGNOSIS — B9689 Other specified bacterial agents as the cause of diseases classified elsewhere: Secondary | ICD-10-CM

## 2020-07-24 DIAGNOSIS — J019 Acute sinusitis, unspecified: Secondary | ICD-10-CM

## 2020-07-24 MED ORDER — DOXYCYCLINE HYCLATE 100 MG PO TABS: 100.0000 mg | ORAL_TABLET | Freq: Two times a day (BID) | ORAL | 0 refills | Status: DC

## 2020-07-24 NOTE — Patient Instructions (Signed)
Shirley Lee, thank you for joining Margaretann Loveless, PA-C for today's virtual visit.  While this provider is not your primary care provider (PCP), if your PCP is located in our provider database this encounter information will be shared with them immediately following your visit.  Consent: (Patient) Shirley Lee provided verbal consent for this virtual visit at the beginning of the encounter.  Current Medications:  Current Outpatient Medications:    doxycycline (VIBRA-TABS) 100 MG tablet, Take 1 tablet (100 mg total) by mouth 2 (two) times daily., Disp: 20 tablet, Rfl: 0   albuterol (PROVENTIL HFA;VENTOLIN HFA) 108 (90 Base) MCG/ACT inhaler, Inhale 2 puffs into the lungs every 6 (six) hours as needed for wheezing or shortness of breath., Disp: 1 Inhaler, Rfl: 2   amphetamine-dextroamphetamine (ADDERALL XR) 30 MG 24 hr capsule, Take 1 capsule (30 mg total) by mouth daily., Disp: 30 capsule, Rfl: 0   cyclobenzaprine (FLEXERIL) 5 MG tablet, Take 1 tablet (5 mg total) by mouth 3 (three) times daily as needed., Disp: 30 tablet, Rfl: 2   fluticasone (FLONASE) 50 MCG/ACT nasal spray, Place 2 sprays into both nostrils daily., Disp: 16 g, Rfl: 6   nabumetone (RELAFEN) 500 MG tablet, Take 1 tablet (500 mg total) by mouth 2 (two) times daily as needed., Disp: 60 tablet, Rfl: 2   traZODone (DESYREL) 50 MG tablet, Take 1 tablet (50 mg total) by mouth at bedtime as needed for sleep., Disp: 30 tablet, Rfl: 1   Medications ordered in this encounter:  Meds ordered this encounter  Medications   doxycycline (VIBRA-TABS) 100 MG tablet    Sig: Take 1 tablet (100 mg total) by mouth 2 (two) times daily.    Dispense:  20 tablet    Refill:  0    Order Specific Question:   Supervising Provider    Answer:   Hyacinth Meeker, BRIAN [[id number]]     *If you need refills on other medications prior to your next appointment, please contact your pharmacy*  Follow-Up: Call back or seek an in-person evaluation if  the symptoms worsen or if the condition fails to improve as anticipated.  Other Instructions - Symptoms consistent with sinus infection, unilateral facial pain all right sided - Will treat with doxycycline as above (PCN allergic) - Continue symptomatic management of choice - Push fluids - Rest as needed - Seek in person management if symptoms fail to improve or worsen.   If you have been instructed to have an in-person evaluation today at a local Urgent Care facility, please use the link below. It will take you to a list of all of our available [facility], including address, phone number and hours of operation. Please do not delay care.  [facility]  If you or a family member do not have a primary care provider, use the link below to schedule a visit and establish care. When you choose a [facility] primary care physician or advanced practice provider, you gain a long-term partner in health. Find a Primary Care Provider  Learn more about [facility] in-office and virtual care options: [facility] Get Care Now   Sinusitis, Adult Sinusitis is soreness and swelling (inflammation) of your sinuses. Sinuses are hollow spaces in the bones around your face. They are located: Around your eyes. In the middle of your forehead. Behind your nose. In your cheekbones. Your sinuses and nasal passages are lined with a fluid called mucus. Mucus drains out of your sinuses. Swelling  can trap mucus in your sinuses. This lets germs (bacteria, virus, or fungus) grow, which leads to infection. Most of the time, this condition is caused bya virus. What are the causes? This condition is caused by: Allergies. Asthma. Germs. Things that block your nose or sinuses. Growths in the nose (nasal polyps). Chemicals or irritants in the air. Fungus (rare). What increases the risk? You are more likely to develop this condition if: You have a weak body defense system (immune  system). You do a lot of swimming or diving. You use nasal sprays too much. You smoke. What are the signs or symptoms? The main symptoms of this condition are pain and a feeling of pressure around the sinuses. Other symptoms include: Stuffy nose (congestion). Runny nose (drainage). Swelling and warmth in the sinuses. Headache. Toothache. A cough that may get worse at night. Mucus that collects in the throat or the back of the nose (postnasal drip). Being unable to smell and taste. Being very tired (fatigue). A fever. Sore throat. Bad breath. How is this diagnosed? This condition is diagnosed based on: Your symptoms. Your medical history. A physical exam. Tests to find out if your condition is short-term (acute) or long-term (chronic). Your doctor may: Check your nose for growths (polyps). Check your sinuses using a tool that has a light (endoscope). Check for allergies or germs. Do imaging tests, such as an MRI or CT scan. How is this treated? Treatment for this condition depends on the cause and whether it is short-term or long-term. If caused by a virus, your symptoms should go away on their own within 10 days. You may be given medicines to relieve symptoms. They include: Medicines that shrink swollen tissue in the nose. Medicines that treat allergies (antihistamines). A spray that treats swelling of the nostrils.  Rinses that help get rid of thick mucus in your nose (nasal saline washes). If caused by bacteria, your doctor may wait to see if you will get better without treatment. You may be given antibiotic medicine if you have: A very bad infection. A weak body defense system. If caused by growths in the nose, you may need to have surgery. Follow these instructions at home: Medicines Take, use, or apply over-the-counter and prescription medicines only as told by your doctor. These may include nasal sprays. If you were prescribed an antibiotic medicine, take it as told by  your doctor. Do not stop taking the antibiotic even if you start to feel better. Hydrate and humidify  Drink enough water to keep your pee (urine) pale yellow. Use a cool mist humidifier to keep the humidity level in your home above 50%. Breathe in steam for 10-15 minutes, 3-4 times a day, or as told by your doctor. You can do this in the bathroom while a hot shower is running. Try not to spend time in cool or dry air.  Rest Rest as much as you can. Sleep with your head raised (elevated). Make sure you get enough sleep each night. General instructions  Put a warm, moist washcloth on your face 3-4 times a day, or as often as told by your doctor. This will help with discomfort. Wash your hands often with soap and water. If there is no soap and water, use hand sanitizer. Do not smoke. Avoid being around people who are smoking (secondhand smoke). Keep all follow-up visits as told by your doctor. This is important.  Contact a doctor if: You have a fever. Your symptoms get worse. Your symptoms  do not get better within 10 days. Get help right away if: You have a very bad headache. You cannot stop throwing up (vomiting). You have very bad pain or swelling around your face or eyes. You have trouble seeing. You feel confused. Your neck is stiff. You have trouble breathing. Summary Sinusitis is swelling of your sinuses. Sinuses are hollow spaces in the bones around your face. This condition is caused by tissues in your nose that become inflamed or swollen. This traps germs. These can lead to infection. If you were prescribed an antibiotic medicine, take it as told by your doctor. Do not stop taking it even if you start to feel better. Keep all follow-up visits as told by your doctor. This is important. This information is not intended to replace advice given to you by your health care provider. Make sure you discuss any questions you have with your healthcare provider. Document Revised:  06/02/2017 Document Reviewed: 06/02/2017 Elsevier Patient Education  2022 [employer].

## 2020-07-24 NOTE — Progress Notes (Signed)
Ms. Shirley Lee, freitas are scheduled for a virtual visit with your provider today.    Just as we do with appointments in the office, we must obtain your consent to participate.  Your consent will be active for this visit and any virtual visit you may have with one of our providers in the next 365 days.    If you have a MyChart account, I can also send a copy of this consent to you electronically.  All virtual visits are billed to your insurance company just like a traditional visit in the office.  As this is a virtual visit, video technology does not allow for your provider to perform a traditional examination.  This may limit your provider's ability to fully assess your condition.  If your provider identifies any concerns that need to be evaluated in person or the need to arrange testing such as labs, EKG, etc, we will make arrangements to do so.    Although advances in technology are sophisticated, we cannot ensure that it will always work on either your end or our end.  If the connection with a video visit is poor, we may have to switch to a telephone visit.  With either a video or telephone visit, we are not always able to ensure that we have a secure connection.   I need to obtain your verbal consent now.   Are you willing to proceed with your visit today?   Ayumi Wangerin has provided verbal consent on 07/24/2020 for a virtual visit (video or telephone).   Margaretann Loveless, PA-C 07/24/2020  5:21 PM  Virtual Visit Consent   Haynes Hoehn, you are scheduled for a virtual visit with a [facility] provider today.     Just as with appointments in the office, your consent must be obtained to participate.  Your consent will be active for this visit and any virtual visit you may have with one of our providers in the next 365 days.     If you have a MyChart account, a copy of this consent can be sent to you electronically.  All virtual visits are billed to your insurance company just like a  traditional visit in the office.    As this is a virtual visit, video technology does not allow for your provider to perform a traditional examination.  This may limit your provider's ability to fully assess your condition.  If your provider identifies any concerns that need to be evaluated in person or the need to arrange testing (such as labs, EKG, etc.), we will make arrangements to do so.     Although advances in technology are sophisticated, we cannot ensure that it will always work on either your end or our end.  If the connection with a video visit is poor, the visit may have to be switched to a telephone visit.  With either a video or telephone visit, we are not always able to ensure that we have a secure connection.     I need to obtain your verbal consent now.   Are you willing to proceed with your visit today?    [name] has provided verbal consent on 07/24/2020 for a virtual visit (video or telephone).   Margaretann Loveless, PA-C   Date: 07/24/2020 5:21 PM   Virtual Visit via Video Note   I, Margaretann Loveless, connected with  Shirley Lee  ([id number], Oct 10, 1960) on 07/24/20 at  5:15 PM EDT by a video-enabled telemedicine application  and verified that I am speaking with the correct person using two identifiers.  Interactive audio and video communications were attempted, although failed due to patient's inability to connect to video. Continued visit with audio only interaction with patient agreement.  Location: Patient: Virtual Visit Location Patient: Other: work isolated Provider: Engineer, mining Provider: Home Office   I discussed the limitations of evaluation and management by telemedicine and the availability of in person appointments. The patient expressed understanding and agreed to proceed.    History of Present Illness: Shirley Lee is a 60 y.o. who identifies as a female who was assigned female at birth, and is being seen today for  possible sinus infection.  HPI: Sinusitis This is a new problem. The current episode started in the past 7 days. The problem has been gradually worsening since onset. There has been no fever. The pain is mild. Associated symptoms include congestion, headaches, sinus pressure (right) and sneezing (some). Pertinent negatives include no ear pain, hoarse voice or sore throat. (Post nasal drainage) Treatments tried: aspirin. The treatment provided no relief.   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 04/29/2016    Allergies:  Allergies  Allergen Reactions   Penicillins Other (See Comments)    Childhood allergy unknown of sypmtoms   Medications:  Current Outpatient Medications:    doxycycline (VIBRA-TABS) 100 MG tablet, Take 1 tablet (100 mg total) by mouth 2 (two) times daily., Disp: 20 tablet, Rfl: 0   albuterol (PROVENTIL HFA;VENTOLIN HFA) 108 (90 Base) MCG/ACT inhaler, Inhale 2 puffs into the lungs every 6 (six) hours as needed for wheezing or shortness of breath., Disp: 1 Inhaler, Rfl: 2   amphetamine-dextroamphetamine (ADDERALL XR) 30 MG 24 hr capsule, Take 1 capsule (30 mg total) by mouth daily., Disp: 30 capsule, Rfl: 0   cyclobenzaprine (FLEXERIL) 5 MG tablet, Take 1 tablet (5 mg total) by mouth 3 (three) times daily as needed., Disp: 30 tablet, Rfl: 2   fluticasone (FLONASE) 50 MCG/ACT nasal spray, Place 2 sprays into both nostrils daily., Disp: 16 g, Rfl: 6   nabumetone (RELAFEN) 500 MG tablet, Take 1 tablet (500 mg total) by mouth 2 (two) times daily as needed., Disp: 60 tablet, Rfl: 2   traZODone (DESYREL) 50 MG tablet, Take 1 tablet (50 mg total) by mouth at bedtime as needed for sleep., Disp: 30  tablet, Rfl: 1  Observations/Objective: Patient is well-developed, well-nourished in no acute distress.  Resting comfortably at home.  Head is normocephalic, atraumatic.  No labored breathing. Speech is clear and coherent with logical content.  Patient is alert and oriented at baseline.   Assessment and Plan: 1. Acute bacterial sinusitis - doxycycline (VIBRA-TABS) 100 MG tablet; Take 1 tablet (100 mg total) by mouth 2 (two) times daily.  Dispense: 20 tablet; Refill: 0 - Symptoms consistent with sinus infection, unilateral facial pain all right sided - Will treat with doxycycline as above (PCN allergic) - Continue symptomatic management of choice - Push fluids - Rest as needed - Seek in person management if symptoms fail to improve or worsen.  Follow Up Instructions: I discussed the assessment and treatment plan with the patient. The patient was provided an opportunity to ask questions and  all were answered. The patient agreed with the plan and demonstrated an understanding of the instructions.  A copy of instructions were sent to the patient via MyChart.  The patient was advised to call back or seek an in-person evaluation if the symptoms worsen or if the condition fails to improve as anticipated.  Time:  I spent 12 minutes with the patient via telehealth technology discussing the above problems/concerns.    Margaretann Loveless, PA-C

## 2020-07-26 ENCOUNTER — Telehealth (INDEPENDENT_AMBULATORY_CARE_PROVIDER_SITE_OTHER): Admitting: Family Medicine

## 2020-07-26 ENCOUNTER — Encounter: Payer: Self-pay | Admitting: Family Medicine

## 2020-07-26 DIAGNOSIS — J019 Acute sinusitis, unspecified: Secondary | ICD-10-CM

## 2020-07-26 DIAGNOSIS — R220 Localized swelling, mass and lump, head: Secondary | ICD-10-CM | POA: Diagnosis not present

## 2020-07-26 DIAGNOSIS — B9689 Other specified bacterial agents as the cause of diseases classified elsewhere: Secondary | ICD-10-CM | POA: Diagnosis not present

## 2020-07-26 NOTE — Progress Notes (Signed)
Virtual Visit via audio Note  I connected with Shirley Lee on 07/26/20 at 6:05 PM by audio- failed video an audio  by [facility] and verified that I am speaking with the correct person using two identifiers.  Patient location: home.  My location: office- [place]   I discussed the limitations, risks, security and privacy concerns of performing an evaluation and management service by telephone and the availability of in person appointments. I also discussed with the patient that there may be a patient responsible charge related to this service. The patient expressed understanding and agreed to proceed, consent obtained  Chief complaint:  Chief Complaint  Patient presents with   Facial Swelling    Pt reports Monday had swelling on her face, was given some Abx from virtual visit then but now worsening again still taking Abx as recommended. Denist concerned this is tooth issue combined with sinus infection swelling in Rt cheek.    History of Present Illness: Shirley Lee is a 60 y.o. female  Facial swelling Video visit noted from 2 days ago, congestion, headache, right-sided sinus pressure and sneezing, 7 days at that time.  2 negative at home COVID tests at that time.  Diagnosed with acute bacterial sinusitis, started on doxycycline 100 mg twice daily. Started 2 nights ago with fever- 100, with chills. By last night feeling better, less pressure in face. Not sore to push on it now. No fever yesterday.  Woke up at 1am today - noticed R side of face was swollen. Now improved, but swelling now into R cheek lower to chin and less pain. No tooth sensitivity. Dental appt tomorrow at 7:30 am - planning on xray at that time. No fever, no current pain. No pain in front of ear, no ear pain. Eating and drinking ok. No headache.  Has been taking doxycycline BID.   Patient Active Problem List   Diagnosis Date Noted   Adhesive capsulitis of left shoulder 05/18/2019   Lisfranc's sprain,  left, subsequent encounter 01/25/2019   Sleep disturbance 10/29/2016   Bilateral swelling of feet 08/26/2016   Attention deficit disorder (ADD) in adult 04/29/2016   Past Medical History:  Diagnosis Date   Chicken pox    History of colon polyps    Past Surgical History:  Procedure Laterality Date   ABDOMINAL HYSTERECTOMY  1989   Allergies  Allergen Reactions   Penicillins Other (See Comments)    Childhood allergy unknown of sypmtoms   Prior to Admission medications   Medication Sig Start Date End Date Taking? Authorizing Provider  albuterol (PROVENTIL HFA;VENTOLIN HFA) 108 (90 Base) MCG/ACT inhaler Inhale 2 puffs into the lungs every 6 (six) hours as needed for wheezing or shortness of breath. 04/02/18  Yes Cirigliano, Mary K, DO  amphetamine-dextroamphetamine (ADDERALL XR) 30 MG 24 hr capsule Take 1 capsule (30 mg total) by mouth daily. 07/21/20  Yes Mliss Sax, MD  cyclobenzaprine (FLEXERIL) 5 MG tablet Take 1 tablet (5 mg total) by mouth 3 (three) times daily as needed. 05/18/20  Yes Cirigliano, Mary K, DO  doxycycline (VIBRA-TABS) 100 MG tablet Take 1 tablet (100 mg total) by mouth 2 (two) times daily. 07/24/20  Yes Joycelyn Man M, PA-C  fluticasone (FLONASE) 50 MCG/ACT nasal spray Place 2 sprays into both nostrils daily. 08/02/19  Yes Worthy Rancher B, FNP  nabumetone (RELAFEN) 500 MG tablet Take 1 tablet (500 mg total) by mouth 2 (two) times daily as needed. 05/18/20  Yes CiriglianoJearld Lesch, DO  traZODone (  DESYREL) 50 MG tablet Take 1 tablet (50 mg total) by mouth at bedtime as needed for sleep. 05/18/20  Yes Cirigliano, Jearld Lesch, DO   Social History   Socioeconomic History   Marital status: Married    Spouse name: Not on file   Number of children: Not on file   Years of education: Not on file   Highest education level: Not on file  Occupational History   Not on file  Tobacco Use   Smoking status: Never   Smokeless tobacco: Never  Substance and Sexual Activity    Alcohol use: Yes    Alcohol/week: 3.0 standard drinks    Types: 3 Glasses of wine per week   Drug use: Never   Sexual activity: Yes  Other Topics Concern   Not on file  Social History Narrative   Not on file   Social Determinants of Health   Financial Resource Strain: Not on file  Food Insecurity: Not on file  Transportation Needs: Not on file  Physical Activity: Not on file  Stress: Not on file  Social Connections: Not on file  Intimate Partner Violence: Not on file    Observations/Objective: There were no vitals filed for this visit. Home temp as above. No fever today/yesterday  Assessment and Plan: Acute bacterial sinusitis  Swelling of right side of face  -Sinusitis symptoms have improved, low-grade fever as above now resolved past 48 hours.  Sinusitis symptoms have significantly improved without any further pain or sinus pressure, now with swelling of right side of face earlier this morning that has since improved.    -Without recent fever, or acute worsening symptoms otherwise, less likely abscess but does have dental appointment tomorrow morning with planned x-rays. Agree with dental visit to rule out alveolar abscess.  Depending on dental visit advised her to call us with status update tomorrow to decide on medication changes versus in office exam, versus further imaging.  Understanding expressed  Follow Up Instructions:  Dentist tomorrow, then update to determine follow-up, ER precautions overnight given I discussed the assessment and treatment plan with the patient. The patient was provided an opportunity to ask questions and all were answered. The patient agreed with the plan and demonstrated an understanding of the instructions.   The patient was advised to call back or seek an in-person evaluation if the symptoms worsen or if the condition fails to improve as anticipated.  I provided 21 minutes of non-face-to-face time during this encounter.   Shade Flood,  MD

## 2020-09-04 ENCOUNTER — Other Ambulatory Visit: Payer: Self-pay | Admitting: Family Medicine

## 2020-09-04 DIAGNOSIS — F988 Other specified behavioral and emotional disorders with onset usually occurring in childhood and adolescence: Secondary | ICD-10-CM

## 2020-09-06 MED ORDER — AMPHETAMINE-DEXTROAMPHET ER 30 MG PO CP24: 30.0000 mg | ORAL_CAPSULE | Freq: Every day | ORAL | 0 refills | Status: DC

## 2020-09-06 NOTE — Telephone Encounter (Signed)
Refill request for:  Adderall XR 30 mg LR  07/21/20, #30, 0 rf LOV  05/18/20 FOV none scheduled.   Please review and advise.  Thanks.  Dm/cma

## 2021-03-23 ENCOUNTER — Other Ambulatory Visit: Payer: Self-pay

## 2021-03-23 ENCOUNTER — Ambulatory Visit (HOSPITAL_COMMUNITY)
Admission: EM | Admit: 2021-03-23 | Discharge: 2021-03-23 | Disposition: A | Discharge status: Home / Self Care | Attending: Student | Admitting: Student

## 2021-03-23 ENCOUNTER — Encounter (HOSPITAL_COMMUNITY): Payer: Self-pay

## 2021-03-23 DIAGNOSIS — J4521 Mild intermittent asthma with (acute) exacerbation: Secondary | ICD-10-CM

## 2021-03-23 MED ORDER — PROMETHAZINE-DM 6.25-15 MG/5ML PO SYRP: 5.0000 mL | ORAL_SOLUTION | Freq: Four times a day (QID) | ORAL | 0 refills | Status: DC | PRN

## 2021-03-23 MED ORDER — PREDNISONE 10 MG (21) PO TBPK
ORAL_TABLET | Freq: Every day | ORAL | 0 refills | Status: DC
Start: 2021-03-23 — End: 2021-11-27

## 2021-03-23 MED ORDER — ALBUTEROL SULFATE HFA 108 (90 BASE) MCG/ACT IN AERS: 1.0000 | INHALATION_SPRAY | Freq: Four times a day (QID) | RESPIRATORY_TRACT | 0 refills | Status: DC | PRN

## 2021-03-23 NOTE — ED Provider Notes (Signed)
[facility]    CSN: [id number] Arrival date & time: 03/23/21  1329      History   Chief Complaint Chief Complaint  Patient presents with   Cough    HPI Shirley Lee is a 61 y.o. female presenting with four days of viral syndrome and asthma flare. History asthma.  Describes nasal congestion, cough, subjective chills.  Cough is primarily nonproductive, with occasional clear sputum.  She is concerned this is progressing into an asthma flare.  Has attempted NyQuil, Alka-Seltzer with some relief.  Denies shortness of breath, chest pain at time of visit.  She is out of her albuterol inhaler, requesting refill.  Denies known sick contacts, though she did recently travel to visit her grandchildren.  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      Home Medications    Prior to Admission medications   Medication Sig Start Date End Date Taking? Authorizing Provider  albuterol (VENTOLIN HFA) 108 (90 Base) MCG/ACT inhaler Inhale 1-2 puffs into the lungs every 6 (six) hours as needed for wheezing or shortness of breath. 03/23/21  Yes Rhys Martini, PA-C  predniSONE (STERAPRED UNI-PAK 21 TAB) 10 MG (21) TBPK tablet Take by mouth daily. Take 6 tabs by mouth daily  for 2 days, then 5 tabs for 2 days, then 4 tabs for 2 days, then 3 tabs for 2 days, 2 tabs for 2 days, then 1 tab by mouth daily for 2 days 03/23/21  Yes Rhys Martini, PA-C  promethazine-dextromethorphan (PROMETHAZINE-DM) 6.25-15 MG/5ML syrup Take  5 mLs by mouth 4 (four) times daily as needed for cough. 03/23/21  Yes Rhys Martini, PA-C  amphetamine-dextroamphetamine (ADDERALL XR) 30 MG 24 hr capsule Take 1 capsule (30 mg total) by mouth daily. 09/06/20   Eulis Foster, FNP  cyclobenzaprine (FLEXERIL) 5 MG tablet Take 1 tablet (5 mg total) by mouth 3 (three) times daily as needed. 05/18/20   Cirigliano, Jearld Lesch, DO  doxycycline (VIBRA-TABS) 100 MG tablet Take 1 tablet (100 mg total) by mouth 2 (two) times daily. 07/24/20   Margaretann Loveless, PA-C  fluticasone (FLONASE) 50 MCG/ACT nasal spray Place 2 sprays into both nostrils daily. 08/02/19   Eulis Foster, FNP  nabumetone (RELAFEN) 500 MG tablet Take 1 tablet (500 mg total) by mouth 2 (two) times daily as needed. 05/18/20   Cirigliano, Jearld Lesch, DO  traZODone (DESYREL) 50 MG tablet Take 1 tablet (50 mg total) by mouth at bedtime as needed for sleep. 05/18/20   Overton Mam, DO    Family History History reviewed. No pertinent family history.  Social History Social History   Tobacco Use   Smoking status: Never   Smokeless tobacco: Never  Substance Use Topics   Alcohol use: Yes    Alcohol/week: 3.0 standard drinks    Types: 3 Glasses of wine per week   Drug use: Never     Allergies   Penicillins   Review  of Systems Review of Systems  Constitutional:  Negative for appetite change, chills and fever.  HENT:  Positive for congestion. Negative for ear pain, rhinorrhea, sinus pressure, sinus pain and sore throat.   Eyes:  Negative for redness and visual disturbance.  Respiratory:  Positive for cough. Negative for chest tightness, shortness of breath and wheezing.   Cardiovascular:  Negative for chest pain and palpitations.  Gastrointestinal:  Negative for abdominal pain, constipation, diarrhea, nausea and vomiting.  Genitourinary:  Negative for dysuria, frequency and urgency.  Musculoskeletal:  Negative for myalgias.  Neurological:  Negative for dizziness, weakness and  headaches.  Psychiatric/Behavioral:  Negative for confusion.   All other systems reviewed and are negative.   Physical Exam Triage Vital Signs ED Triage Vitals  Enc Vitals Group     BP 03/23/21 1437 124/81     Pulse Rate 03/23/21 1437 (!) 107     Resp 03/23/21 1437 18     Temp 03/23/21 1437 98.6 F (37 C)     Temp Source 03/23/21 1437 Oral     SpO2 03/23/21 1437 96 %     Weight --      Height --      Head Circumference --      Peak Flow --      Pain Score 03/23/21 1435 0     Pain Loc --      Pain Edu? --      Excl. in GC? --    No data found.  Updated Vital Signs BP 124/81 (BP Location: Left Arm)    Pulse (!) 107    Temp 98.6 F (37 C) (Oral)    Resp 18    LMP  (LMP Unknown)    SpO2 96%   Visual Acuity Right Eye Distance:   Left Eye Distance:   Bilateral Distance:    Right Eye Near:   Left Eye Near:    Bilateral Near:     Physical Exam Vitals reviewed.  Constitutional:      General: She is not in acute distress.    Appearance: Normal appearance. She is not ill-appearing.  HENT:     Head: Normocephalic and atraumatic.     Right Ear: Tympanic membrane, ear canal and external ear normal. No tenderness. No middle ear effusion. There is no impacted cerumen. Tympanic membrane is not perforated, erythematous, retracted or bulging.     Left Ear: Tympanic membrane, ear canal and external ear normal. No tenderness.  No middle ear effusion. There is no impacted cerumen. Tympanic membrane is not perforated, erythematous, retracted or bulging.     Nose: Nose normal. No congestion.     Mouth/Throat:     Mouth: Mucous membranes are moist.     Pharynx: Uvula midline. No oropharyngeal exudate or posterior oropharyngeal erythema.  Eyes:     Extraocular Movements: Extraocular movements intact.     Pupils: Pupils are equal, round, and reactive to light.  Cardiovascular:     Rate and Rhythm: Regular rhythm. Tachycardia present.     Heart sounds: Normal heart sounds.  Pulmonary:      Effort: Pulmonary effort is normal.     Breath sounds: Normal breath sounds. No decreased breath sounds, wheezing, rhonchi or rales.  Abdominal:     Palpations: Abdomen is soft.     Tenderness: There is no abdominal tenderness. There is no guarding or rebound.  Lymphadenopathy:     Cervical: No cervical adenopathy.     Right cervical: No superficial cervical  adenopathy.    Left cervical: No superficial cervical adenopathy.  Neurological:     General: No focal deficit present.     Mental Status: She is alert and oriented to person, place, and time.  Psychiatric:        Mood and Affect: Mood normal.        Behavior: Behavior normal.        Thought Content: Thought content normal.        Judgment: Judgment normal.     UC Treatments / Results  Labs (all labs ordered are listed, but only abnormal results are displayed) Labs Reviewed - No data to display  EKG   Radiology No results found.  Procedures Procedures (including critical care time)  Medications Ordered in UC Medications - No data to display  Initial Impression / Assessment and Plan / UC Course  I have reviewed the triage vital signs and the nursing notes.  Pertinent labs & imaging results that were available during my care of the patient were reviewed by me and considered in my medical decision making (see chart for details).     This patient is a very pleasant 61 y.o. year old female presenting with asthma exacerbation related to viral syndrome. Afebrile, borderline tachy. No adventitious breath sounds. No chest pain or shortness of breath.   Patient states symptoms consistent with past asthma flares. Albuterol inhaler refilled. Prednisone taper sent. Promethazine DM for bedtime.   ED return precautions discussed. Patient verbalizes understanding and agreement.   Wells score for PE 1.5 given tachycardia. Low suspicion of this given presentation/ symptoms consistent with asthma flare, no SOB and  CP.  -Coding Level 4 for acute exacerbation of chronic condition and prescription drug management.   Final Clinical Impressions(s) / UC Diagnoses   Final diagnoses:  Mild intermittent asthma with acute exacerbation     Discharge Instructions      -Prednisone taper for cough/bronchitis. I recommend taking this in the morning as it could give you energy.  Avoid NSAIDs like ibuprofen and alleve while taking this medication as they can increase your risk of stomach upset and even GI bleeding when in combination with a steroid. You can continue tylenol (acetaminophen) up to  3x daily. -Albuterol inhaler as needed for cough, wheezing, shortness of breath, 1 to 2 puffs every 6 hours as needed. -Promethazine DM cough syrup for congestion/cough. This could make you drowsy, so take at night before bed. -With a virus, you're typically contagious for 5-7 days, or as long as you're having fevers.  -Follow-up if symptoms worsen instead of improve- shortness of breath, cough, fevers, etc.      ED Prescriptions     Medication Sig Dispense Auth. Provider   predniSONE (STERAPRED UNI-PAK 21 TAB) 10 MG (21) TBPK tablet Take by mouth daily. Take 6 tabs by mouth daily  for 2 days, then 5 tabs for 2 days, then 4 tabs for 2 days, then 3 tabs for 2 days, 2 tabs for 2 days, then 1 tab by mouth daily for 2 days 42 tablet Rhys Martini, PA-C   promethazine-dextromethorphan (PROMETHAZINE-DM) 6.25-15 MG/5ML syrup Take 5 mLs by mouth 4 (four) times daily as needed for cough. 118 mL Rhys Martini, PA-C   albuterol (VENTOLIN HFA) 108 (90 Base) MCG/ACT inhaler Inhale 1-2 puffs into the lungs every 6 (six) hours as needed for wheezing or shortness of breath. 1 each Rhys Martini, PA-C      PDMP not reviewed this encounter.  Rhys Martini, PA-C 03/23/21 1506

## 2021-03-23 NOTE — ED Triage Notes (Signed)
4 day h/o cough, congestion, runny nose and onset yesterday of fever. ?Has been taking nyquil and other otc cough meds w/o relief. No v/d. Pt notes that her chest feels tight from constant coughing, but denies sob and cp. Needs refill for inhaler. ?

## 2021-03-23 NOTE — Discharge Instructions (Addendum)
-  Prednisone taper for cough/bronchitis. I recommend taking this in the morning as it could give you energy.  Avoid NSAIDs like ibuprofen and alleve while taking this medication as they can increase your risk of stomach upset and even GI bleeding when in combination with a steroid. You can continue tylenol (acetaminophen) up to 1000mg  3x daily. ?-Albuterol inhaler as needed for cough, wheezing, shortness of breath, 1 to 2 puffs every 6 hours as needed. ?-Promethazine DM cough syrup for congestion/cough. This could make you drowsy, so take at night before bed. ?-With a virus, you're typically contagious for 5-7 days, or as long as you're having fevers.  ?-Follow-up if symptoms worsen instead of improve- shortness of breath, cough, fevers, etc.  ?

## 2021-08-20 ENCOUNTER — Telehealth: Admitting: Physician Assistant

## 2021-08-20 DIAGNOSIS — K648 Other hemorrhoids: Secondary | ICD-10-CM

## 2021-08-20 MED ORDER — HYDROCORTISONE ACETATE 25 MG RE SUPP: 25.0000 mg | Freq: Two times a day (BID) | RECTAL | 0 refills | Status: DC

## 2021-08-20 NOTE — Progress Notes (Signed)
Virtual Visit Consent   Shirley Lee, you are scheduled for a virtual visit with a [place] provider today. Just as with appointments in the office, your consent must be obtained to participate. Your consent will be active for this visit and any virtual visit you may have with one of our providers in the next 365 days. If you have a MyChart account, a copy of this consent can be sent to you electronically.  As this is a virtual visit, video technology does not allow for your provider to perform a traditional examination. This may limit your provider's ability to fully assess your condition. If your provider identifies any concerns that need to be evaluated in person or the need to arrange testing (such as labs, EKG, etc.), we will make arrangements to do so. Although advances in technology are sophisticated, we cannot ensure that it will always work on either your end or our end. If the connection with a video visit is poor, the visit may have to be switched to a telephone visit. With either a video or telephone visit, we are not always able to ensure that we have a secure connection.  By engaging in this virtual visit, you consent to the provision of healthcare and authorize for your insurance to be billed (if applicable) for the services provided during this visit. Depending on your insurance coverage, you may receive a charge related to this service.  I need to obtain your verbal consent now. Are you willing to proceed with your visit today? Ilaria Much has provided verbal consent on 08/20/2021 for a virtual visit (video or telephone). Margaretann Loveless, PA-C  Date: 08/20/2021 6:35 PM  Virtual Visit via Video Note   I, Margaretann Loveless, connected with  Shirley Lee  ([id number], [birth year]) on 08/20/21 at  6:30 PM EDT by a video-enabled telemedicine application and verified that I am speaking with the correct person using two identifiers.  Location: Patient: Virtual  Visit Location Patient: Home Provider: Virtual Visit Location Provider: Home Office   I discussed the limitations of evaluation and management by telemedicine and the availability of in person appointments. The patient expressed understanding and agreed to proceed.    History of Present Illness: Shirley Lee is a 61 y.o. who identifies as a female who was assigned female at birth, and is being seen today for blood in stool.  HPI: Rectal Bleeding  The current episode started more than 2 weeks ago (reports she started walking more and when she reached the point of walking 5-6 miles she started having bloody BM. Episodes only occur on days she walks and normally around the 5 mile mark). The onset was gradual. The problem occurs occasionally (with walking). The problem has been unchanged. The patient is experiencing no pain. The stool is described as mixed with blood and liquid. Pertinent negatives include no fever, no abdominal pain, no diarrhea, no hematemesis, no rectal pain, no vomiting, no hematuria, no chest pain, no headaches, no coughing and no difficulty breathing. Urine output has been normal. Her past medical history is significant for recent change in diet. Her past medical history does not include abdominal surgery, inflammatory bowel disease or recent abdominal injury. There were no sick contacts.     Problems:  Patient Active Problem List   Diagnosis Date Noted   Adhesive capsulitis of left shoulder 05/18/2019   Lisfranc's sprain, left, subsequent encounter 01/25/2019   Sleep disturbance 10/29/2016   Bilateral swelling of feet 08/26/2016  Attention deficit disorder (ADD) in adult 04/29/2016    Allergies:  Allergies  Allergen Reactions   Penicillins Other (See Comments)    Childhood allergy unknown of sypmtoms   Medications:  Current Outpatient Medications:    hydrocortisone (ANUSOL-HC) 25 MG suppository, Place 1 suppository (25 mg total) rectally 2 (two) times daily.,  Disp: 12 suppository, Rfl: 0   albuterol (VENTOLIN HFA) 108 (90 Base) MCG/ACT inhaler, Inhale 1-2 puffs into the lungs every 6 (six) hours as needed for wheezing or shortness of breath., Disp: 1 each, Rfl: 0   amphetamine-dextroamphetamine (ADDERALL XR) 30 MG 24 hr capsule, Take 1 capsule (30 mg total) by mouth daily., Disp: 30 capsule, Rfl: 0   cyclobenzaprine (FLEXERIL) 5 MG tablet, Take 1 tablet (5 mg total) by mouth 3 (three) times daily as needed., Disp: 30 tablet, Rfl: 2   doxycycline (VIBRA-TABS) 100 MG tablet, Take 1 tablet (100 mg total) by mouth 2 (two) times daily., Disp: 20 tablet, Rfl: 0   fluticasone (FLONASE) 50 MCG/ACT nasal spray, Place 2 sprays into both nostrils daily., Disp: 16 g, Rfl: 6   nabumetone (RELAFEN) 500 MG tablet, Take 1 tablet (500 mg total) by mouth 2 (two) times daily as needed., Disp: 60 tablet, Rfl: 2   predniSONE (STERAPRED UNI-PAK 21 TAB) 10 MG (21) TBPK tablet, Take by mouth daily. Take 6 tabs by mouth daily  for 2 days, then 5 tabs for 2 days, then 4 tabs for 2 days, then 3 tabs for 2 days, 2 tabs for 2 days, then 1 tab by mouth daily for 2 days, Disp: 42 tablet, Rfl: 0   promethazine-dextromethorphan (PROMETHAZINE-DM) 6.25-15 MG/5ML syrup, Take 5 mLs by mouth 4 (four) times daily as needed for cough., Disp: 118 mL, Rfl: 0   traZODone (DESYREL) 50 MG tablet, Take 1 tablet (50 mg total) by mouth at bedtime as needed for sleep., Disp: 30 tablet, Rfl: 1  Observations/Objective: Patient is well-developed, well-nourished in no acute distress.  Resting comfortably at home.  Head is normocephalic, atraumatic.  No labored breathing.  Speech is clear and coherent with logical content.  Patient is alert and oriented at baseline.    Assessment and Plan: 1. Internal hemorrhoid - hydrocortisone (ANUSOL-HC) 25 MG suppository; Place 1 suppository (25 mg total) rectally 2 (two) times daily.  Dispense: 12 suppository; Refill: 0  - Suspect internal hemorrhoid that is  irritated from loose BM that are occurring with extended walking as exercise - Currently bleeding only happens with walking and only after walking about an hour (around the 5th mile) - No warning signs or signs of anemia - Will give Anusol-HC suppositories - Advised if symptoms continue she should seek in person evaluation  Follow Up Instructions: I discussed the assessment and treatment plan with the patient. The patient was provided an opportunity to ask questions and all were answered. The patient agreed with the plan and demonstrated an understanding of the instructions.  A copy of instructions were sent to the patient via MyChart unless otherwise noted below.    The patient was advised to call back or seek an in-person evaluation if the symptoms worsen or if the condition fails to improve as anticipated.  Time:  I spent 15 minutes with the patient via telehealth technology discussing the above problems/concerns.    Margaretann Loveless, PA-C

## 2021-08-20 NOTE — Patient Instructions (Signed)
Haynes Hoehn, thank you for joining Margaretann Loveless, PA-C for today's virtual visit.  While this provider is not your primary care provider (PCP), if your PCP is located in our provider database this encounter information will be shared with them immediately following your visit.  Consent: (Patient) Shirley Lee provided verbal consent for this virtual visit at the beginning of the encounter.  Current Medications:  Current Outpatient Medications:    hydrocortisone (ANUSOL-HC) 25 MG suppository, Place 1 suppository (25 mg total) rectally 2 (two) times daily., Disp: 12 suppository, Rfl: 0   albuterol (VENTOLIN HFA) 108 (90 Base) MCG/ACT inhaler, Inhale 1-2 puffs into the lungs every 6 (six) hours as needed for wheezing or shortness of breath., Disp: 1 each, Rfl: 0   amphetamine-dextroamphetamine (ADDERALL XR) 30 MG 24 hr capsule, Take 1 capsule (30 mg total) by mouth daily., Disp: 30 capsule, Rfl: 0   nabumetone (RELAFEN) 500 MG tablet, Take 1 tablet (500 mg total) by mouth 2 (two) times daily as needed., Disp: 60 tablet, Rfl: 2   predniSONE (STERAPRED UNI-PAK 21 TAB) 10 MG (21) TBPK tablet, Take by mouth daily. Take 6 tabs by mouth daily  for 2 days, then 5 tabs for 2 days, then 4 tabs for 2 days, then 3 tabs for 2 days, 2 tabs for 2 days, then 1 tab by mouth daily for 2 days, Disp: 42 tablet, Rfl: 0   traZODone (DESYREL) 50 MG tablet, Take 1 tablet (50 mg total) by mouth at bedtime as needed for sleep., Disp: 30 tablet, Rfl: 1   Medications ordered in this encounter:  Meds ordered this encounter  Medications   hydrocortisone (ANUSOL-HC) 25 MG suppository    Sig: Place 1 suppository (25 mg total) rectally 2 (two) times daily.    Dispense:  12 suppository    Refill:  0    Order Specific Question:   Supervising Provider    Answer:   Hyacinth Meeker, BRIAN [[id number]]     *If you need refills on other medications prior to your next appointment, please contact your  pharmacy*  Follow-Up: Call back or seek an in-person evaluation if the symptoms worsen or if the condition fails to improve as anticipated.  Other Instructions Hemorrhoids Hemorrhoids are swollen veins in and around the rectum or anus. There are two types of hemorrhoids: Internal hemorrhoids. These occur in the veins that are just inside the rectum. They may poke through to the outside and become irritated and painful. External hemorrhoids. These occur in the veins that are outside the anus and can be felt as a painful swelling or hard lump near the anus. Most hemorrhoids do not cause serious problems, and they can be managed with home treatments such as diet and lifestyle changes. If home treatments do not help the symptoms, procedures can be done to shrink or remove the hemorrhoids. What are the causes? This condition is caused by increased pressure in the anal area. This pressure may result from various things, including: Constipation. Straining to have a bowel movement. Diarrhea. Pregnancy. Obesity. Sitting for long periods of time. Heavy lifting or other activity that causes you to strain. Anal sex. Riding a bike for a long period of time. What are the signs or symptoms? Symptoms of this condition include: Pain. Anal itching or irritation. Rectal bleeding. Leakage of stool (feces). Anal swelling. One or more lumps around the anus. How is this diagnosed? This condition can often be diagnosed through a visual exam. Other exams or tests  may also be done, such as: An exam that involves feeling the rectal area with a gloved hand (digital rectal exam). An exam of the anal canal that is done using a small tube (anoscope). A blood test, if you have lost a significant amount of blood. A test to look inside the colon using a flexible tube with a camera on the end (sigmoidoscopy or colonoscopy). How is this treated? This condition can usually be treated at home. However, various  procedures may be done if dietary changes, lifestyle changes, and other home treatments do not help your symptoms. These procedures can help make the hemorrhoids smaller or remove them completely. Some of these procedures involve surgery, and others do not. Common procedures include: Rubber band ligation. Rubber bands are placed at the base of the hemorrhoids to cut off their blood supply. Sclerotherapy. Medicine is injected into the hemorrhoids to shrink them. Infrared coagulation. A type of light energy is used to get rid of the hemorrhoids. Hemorrhoidectomy surgery. The hemorrhoids are surgically removed, and the veins that supply them are tied off. Stapled hemorrhoidopexy surgery. The surgeon staples the base of the hemorrhoid to the rectal wall. Follow these instructions at home: Eating and drinking  Eat foods that have a lot of fiber in them, such as whole grains, beans, nuts, fruits, and vegetables. Ask your health care provider about taking products that have added fiber (fiber supplements). Reduce the amount of fat in your diet. You can do this by eating low-fat dairy products, eating less red meat, and avoiding processed foods. Drink enough fluid to keep your urine pale yellow. Managing pain and swelling  Take warm sitz baths for 20 minutes, 3-4 times a day to ease pain and discomfort. You may do this in a bathtub or using a portable sitz bath that fits over the toilet. If directed, apply ice to the affected area. Using ice packs between sitz baths may be helpful. Put ice in a plastic bag. Place a towel between your skin and the bag. Leave the ice on for 20 minutes, 2-3 times a day. General instructions Take over-the-counter and prescription medicines only as told by your health care provider. Use medicated creams or suppositories as told. Get regular exercise. Ask your health care provider how much and what kind of exercise is best for you. In general, you should do moderate  exercise for at least 30 minutes on most days of the week (150 minutes each week). This can include activities such as walking, biking, or yoga. Go to the bathroom when you have the urge to have a bowel movement. Do not wait. Avoid straining to have bowel movements. Keep the anal area dry and clean. Use wet toilet paper or moist towelettes after a bowel movement. Do not sit on the toilet for long periods of time. This increases blood pooling and pain. Keep all follow-up visits as told by your health care provider. This is important. Contact a health care provider if you have: Increasing pain and swelling that are not controlled by treatment or medicine. Difficulty having a bowel movement, or you are unable to have a bowel movement. Pain or inflammation outside the area of the hemorrhoids. Get help right away if you have: Uncontrolled bleeding from your rectum. Summary Hemorrhoids are swollen veins in and around the rectum or anus. Most hemorrhoids can be managed with home treatments such as diet and lifestyle changes. Taking warm sitz baths can help ease pain and discomfort. In severe cases, procedures or  surgery can be done to shrink or remove the hemorrhoids. This information is not intended to replace advice given to you by your health care provider. Make sure you discuss any questions you have with your health care provider. Document Revised: 07/12/2020 Document Reviewed: 07/12/2020 Elsevier Patient Education  2023 Elsevier Inc.    If you have been instructed to have an in-person evaluation today at a local Urgent Care facility, please use the link below. It will take you to a list of all of our available [facility], including address, phone number and hours of operation. Please do not delay care.  [facility]  If you or a family member do not have a primary care provider, use the link below to schedule a visit and establish care. When you choose a [facility] primary care physician or advanced practice provider, you gain a long-term partner in health. Find a Primary Care Provider  Learn more about [facility]'s in-office and virtual care options: [facility] - Get Care Now

## 2021-11-27 ENCOUNTER — Encounter: Payer: Self-pay | Admitting: Nurse Practitioner

## 2021-11-27 ENCOUNTER — Ambulatory Visit (INDEPENDENT_AMBULATORY_CARE_PROVIDER_SITE_OTHER): Admitting: Nurse Practitioner

## 2021-11-27 ENCOUNTER — Other Ambulatory Visit: Payer: Self-pay

## 2021-11-27 VITALS — BP 120/70 | HR 102 | Temp 98.2°F | Ht 64.0 in | Wt 160.4 lb

## 2021-11-27 DIAGNOSIS — M542 Cervicalgia: Secondary | ICD-10-CM | POA: Insufficient documentation

## 2021-11-27 DIAGNOSIS — Z79899 Other long term (current) drug therapy: Secondary | ICD-10-CM | POA: Diagnosis not present

## 2021-11-27 DIAGNOSIS — J069 Acute upper respiratory infection, unspecified: Secondary | ICD-10-CM

## 2021-11-27 DIAGNOSIS — F988 Other specified behavioral and emotional disorders with onset usually occurring in childhood and adolescence: Secondary | ICD-10-CM

## 2021-11-27 DIAGNOSIS — Z13220 Encounter for screening for lipoid disorders: Secondary | ICD-10-CM | POA: Diagnosis not present

## 2021-11-27 DIAGNOSIS — Z136 Encounter for screening for cardiovascular disorders: Secondary | ICD-10-CM

## 2021-11-27 DIAGNOSIS — R635 Abnormal weight gain: Secondary | ICD-10-CM

## 2021-11-27 LAB — POC COVID19 BINAXNOW: SARS Coronavirus 2 Ag: NEGATIVE

## 2021-11-27 LAB — POCT INFLUENZA A/B
Influenza A, POC: NEGATIVE
Influenza B, POC: NEGATIVE

## 2021-11-27 MED ORDER — AMPHETAMINE-DEXTROAMPHET ER 30 MG PO CP24: 30.0000 mg | ORAL_CAPSULE | Freq: Every day | ORAL | 0 refills | Status: DC

## 2021-11-27 MED ORDER — NABUMETONE 500 MG PO TABS: 500.0000 mg | ORAL_TABLET | Freq: Two times a day (BID) | ORAL | 0 refills | Status: DC | PRN

## 2021-11-27 NOTE — Patient Instructions (Signed)
Go to lab Resume Adderall 30mg  XR daily on Monday-Friday. Drug hoiliday on Saturday and Sunday  URI Instructions: Encourage adequate oral hydration. Use over-the-counter  "cold" medicines  such as "Tylenol cold" , "Advil cold",  "Mucinex" or" Mucinex D"  for cough and congestion.  Avoid decongestants if you have high blood pressure. Use" Delsym" or" Robitussin" cough syrup varietis for cough.  You can use plain "Tylenol" or "Advi"l for fever, chills and achyness.  "Common cold" symptoms are usually triggered by a virus.  The antibiotics are usually not necessary. On average, a" viral cold" illness may take 7-10 days to resolve. Please, make an appointment if you are not better or if you're worse.

## 2021-11-27 NOTE — Assessment & Plan Note (Addendum)
Diagnosed in 2015 by provider in [place], [place] Adderall previous prescribed by previous pcps: Dr. Cherene Julian and Dr. Leavy Cella Per PMP database: Adderall filled 08/17/20, 07/21/20, 06/10/20, 05/12/20, 04/01/20, 02/10/20, 12/22/19. No reg flags noted Denies any anxiety, depression, sleep disturbance, eating disorder, no known hx of CAD or CVA or HTN. No ETOH ot tobacco or illicit drug use. She works full time for [employer]. Reports increase difficulty with staying on task with work load this time of the year.  She agreed to control substance contract and UDS collection today. Adderall 30mg XR sent for November, December and January. Advised to have drug holiday on the weekends. F/up in 64months

## 2021-11-27 NOTE — Progress Notes (Signed)
Established Patient Visit  Patient: Shirley Lee   DOB: [birth year]   61 y.o. Female  MRN: [id number] Visit Date: 11/27/2021  Subjective:    Chief Complaint  Patient presents with   Establish Care    TOC, med refills  Cough & chills x 2 days     URI  This is a new problem. The current episode started in the past 7 days. The problem has been unchanged. The maximum temperature recorded prior to her arrival was 100.4 - 100.9 F. Associated symptoms include coughing. Pertinent negatives include no abdominal pain, chest pain, congestion, diarrhea, dysuria, ear pain, headaches, joint pain, joint swelling, nausea, neck pain, plugged ear sensation, rash, rhinorrhea, sinus pain, sneezing, sore throat, swollen glands, vomiting or wheezing. She has tried nothing for the symptoms.   Attention deficit disorder (ADD) in adult Diagnosed in 2015 by provider in [place], [place] Adderall previous prescribed by previous pcps: Dr. Cherene Julian and Dr. Leavy Cella Per PMP database: Adderall filled 08/17/20, 07/21/20, 06/10/20, 05/12/20, 04/01/20, 02/10/20, 12/22/19. No reg flags noted Denies any anxiety, depression, sleep disturbance, eating disorder, no known hx of CAD or CVA or HTN. No ETOH ot tobacco or illicit drug use. She works full time for [employer]. Reports increase difficulty with staying on task with work load this time of the year.  She agreed to control substance contract and UDS collection today. Adderall 30mg XR sent for November, December and January. Advised to have drug holiday on the weekends. F/up in 28months  Musculoskeletal neck pain Chronic, waxing and waning, trigger: increase stress or poor posture. No weakness, no paresthesia  Relafen refill sent   Reviewed medical, surgical, and social history today  Medications: Outpatient Medications Prior to Visit  Medication Sig   [DISCONTINUED] albuterol (VENTOLIN HFA) 108 (90 Base) MCG/ACT inhaler Inhale 1-2 puffs into  the lungs every 6 (six) hours as needed for wheezing or shortness of breath. (Patient not taking: Reported on 11/27/2021)   [DISCONTINUED] amphetamine-dextroamphetamine (ADDERALL XR) 30 MG 24 hr capsule Take 1 capsule (30 mg total) by mouth daily. (Patient not taking: Reported on 11/27/2021)   [DISCONTINUED] hydrocortisone (ANUSOL-HC) 25 MG suppository Place 1 suppository (25 mg total) rectally 2 (two) times daily. (Patient not taking: Reported on 11/27/2021)   [DISCONTINUED] nabumetone (RELAFEN) 500 MG tablet Take 1 tablet (500 mg total) by mouth 2 (two) times daily as needed. (Patient not taking: Reported on 11/27/2021)   [DISCONTINUED] predniSONE (STERAPRED UNI-PAK 21 TAB) 10 MG (21) TBPK tablet Take by mouth daily. Take 6 tabs by mouth daily  for 2 days, then 5 tabs for 2 days, then 4 tabs for 2 days, then 3 tabs for 2 days, 2 tabs for 2 days, then 1 tab by mouth daily for 2 days (Patient not taking: Reported on 11/27/2021)   [DISCONTINUED] traZODone (DESYREL) 50 MG tablet Take 1 tablet (50 mg total) by mouth at bedtime as needed for sleep. (Patient not taking: Reported on 11/27/2021)   No facility-administered medications prior to visit.   Reviewed past medical and social history.   ROS per HPI above      Objective:  BP 120/70 (BP Location: Right Arm, Patient Position: Sitting, Cuff Size: Small)   Pulse (!) 102   Temp 98.2 F (36.8 C) (Temporal)   Ht 5\' 4"  (1.626 m)   Wt 160 lb 6.4 oz (72.8 kg)   LMP  (LMP Unknown)   SpO2 93%  BMI 27.53 kg/m      Physical Exam Vitals reviewed.  Cardiovascular:     Rate and Rhythm: Normal rate and regular rhythm.     Pulses: Normal pulses.     Heart sounds: Normal heart sounds.  Pulmonary:     Effort: Pulmonary effort is normal.     Breath sounds: Normal breath sounds.  Musculoskeletal:     Cervical back: Normal range of motion and neck supple.     Right lower leg: No edema.     Left lower leg: No edema.  Lymphadenopathy:     Cervical:  No cervical adenopathy.  Neurological:     Mental Status: She is alert and oriented to person, place, and time.  Psychiatric:        Mood and Affect: Mood normal.        Behavior: Behavior normal.        Thought Content: Thought content normal.     Results for orders placed or performed in visit on 11/27/21  POC COVID-19  Result Value Ref Range   SARS Coronavirus 2 Ag Negative Negative  POCT Influenza A/B  Result Value Ref Range   Influenza A, POC Negative Negative   Influenza B, POC Negative Negative      Assessment & Plan:    Problem List Items Addressed This Visit       Other   Attention deficit disorder (ADD) in adult - Primary    Diagnosed in 2015 by provider in [place], [place] Adderall previous prescribed by previous pcps: Dr. Cherene Julian and Dr. Leavy Cella Per PMP database: Adderall filled 08/17/20, 07/21/20, 06/10/20, 05/12/20, 04/01/20, 02/10/20, 12/22/19. No reg flags noted Denies any anxiety, depression, sleep disturbance, eating disorder, no known hx of CAD or CVA or HTN. No ETOH ot tobacco or illicit drug use. She works full time for [employer]. Reports increase difficulty with staying on task with work load this time of the year.  She agreed to control substance contract and UDS collection today. Adderall 30mg XR sent for November, December and January. Advised to have drug holiday on the weekends. F/up in 33months      Relevant Medications   amphetamine-dextroamphetamine (ADDERALL XR) 30 MG 24 hr capsule (Start on 01/27/2022)   Other Relevant Orders   DRUG MONITORING, PANEL 8 WITH CONFIRMATION, URINE   Musculoskeletal neck pain    Chronic, waxing and waning, trigger: increase stress or poor posture. No weakness, no paresthesia  Relafen refill sent      Relevant Medications   nabumetone (RELAFEN) 500 MG tablet   Other Visit Diagnoses     Viral upper respiratory tract infection       Relevant Orders   POC COVID-19 (Completed)   POCT Influenza A/B  (Completed)   Controlled substance agreement signed       Weight gain       Relevant Orders   Comprehensive metabolic panel   TSH   Encounter for lipid screening for cardiovascular disease       Relevant Orders   Lipid panel      Return in about 3 months (around 02/27/2022) for ADD and medication management.     03/01/2022, NP

## 2021-11-27 NOTE — Assessment & Plan Note (Signed)
Chronic, waxing and waning, trigger: increase stress or poor posture. No weakness, no paresthesia  Relafen refill sent

## 2021-11-28 LAB — COMPREHENSIVE METABOLIC PANEL
ALT: 39 U/L — ABNORMAL HIGH (ref 0–35)
AST: 31 U/L (ref 0–37)
Albumin: 4.4 g/dL (ref 3.5–5.2)
Alkaline Phosphatase: 57 U/L (ref 39–117)
BUN: 10 mg/dL (ref 6–23)
CO2: 29 mEq/L (ref 19–32)
Calcium: 9.4 mg/dL (ref 8.4–10.5)
Chloride: 104 mEq/L (ref 96–112)
Creatinine, Ser: 0.7 mg/dL (ref 0.40–1.20)
GFR: 93.3 mL/min (ref >60.00)
Glucose, Bld: 85 mg/dL (ref 70–99)
Potassium: 4.1 mEq/L (ref 3.5–5.1)
Sodium: 138 mEq/L (ref 135–145)
Total Bilirubin: 0.4 mg/dL (ref 0.2–1.2)
Total Protein: 7.1 g/dL (ref 6.0–8.3)

## 2021-11-28 LAB — DRUG MONITORING, PANEL 8 WITH CONFIRMATION, URINE
6 Acetylmorphine: NEGATIVE ng/mL (ref <10)
Alcohol Metabolites: NEGATIVE ng/mL (ref <500)
Amphetamines: NEGATIVE ng/mL (ref <500)
Benzodiazepines: NEGATIVE ng/mL (ref <100)
Buprenorphine, Urine: NEGATIVE ng/mL (ref <5)
Cocaine Metabolite: NEGATIVE ng/mL (ref <150)
Creatinine: 21.6 mg/dL (ref >=20.0)
MDMA: NEGATIVE ng/mL (ref <500)
Marijuana Metabolite: NEGATIVE ng/mL (ref <20)
Opiates: NEGATIVE ng/mL (ref <100)
Oxidant: NEGATIVE ug/mL (ref <200)
Oxycodone: NEGATIVE ng/mL (ref <100)
pH: 7.3 (ref 4.5–9.0)

## 2021-11-28 LAB — LIPID PANEL
Cholesterol: 207 mg/dL — ABNORMAL HIGH (ref 0–200)
HDL: 56.1 mg/dL (ref >39.00)
LDL Cholesterol: 132 mg/dL — ABNORMAL HIGH (ref 0–99)
NonHDL: 151.11
Total CHOL/HDL Ratio: 4
Triglycerides: 98 mg/dL (ref 0.0–149.0)
VLDL: 19.6 mg/dL (ref 0.0–40.0)

## 2021-11-28 LAB — TSH: TSH: 0.73 u[IU]/mL (ref 0.35–5.50)

## 2021-11-28 LAB — DM TEMPLATE

## 2021-11-29 ENCOUNTER — Encounter: Payer: Self-pay | Admitting: Nurse Practitioner

## 2021-12-03 ENCOUNTER — Telehealth: Payer: Self-pay | Admitting: Nurse Practitioner

## 2021-12-03 DIAGNOSIS — R051 Acute cough: Secondary | ICD-10-CM

## 2021-12-03 NOTE — Telephone Encounter (Signed)
Pt says she was seen recently 11/27/21 and discussed how sick she has been feeling, coughing. She was seen by an urgent care this weekend and feels she is getting worse. She would like a cough syrup for this issue. [id number] [place] Otto, [place] - [address] AT [facility] OF [address], [place] [address] Phone: [phone]  Fax: [phone] DEA #: [id number]  Please advise pt at [phone] (Home)

## 2021-12-04 MED ORDER — PROMETHAZINE-DM 6.25-15 MG/5ML PO SYRP: 5.0000 mL | ORAL_SOLUTION | Freq: Three times a day (TID) | ORAL | 0 refills | Status: DC | PRN

## 2021-12-04 MED ORDER — ALBUTEROL SULFATE HFA 108 (90 BASE) MCG/ACT IN AERS: 1.0000 | INHALATION_SPRAY | Freq: Four times a day (QID) | RESPIRATORY_TRACT | 0 refills | Status: DC | PRN

## 2021-12-04 NOTE — Addendum Note (Signed)
Addended by: Alysia Penna L on: 12/04/2021 10:14 AM   Modules accepted: Orders

## 2021-12-04 NOTE — Addendum Note (Signed)
Addended by: Alysia Penna L on: 12/04/2021 10:13 AM   Modules accepted: Orders

## 2021-12-08 ENCOUNTER — Emergency Department (HOSPITAL_BASED_OUTPATIENT_CLINIC_OR_DEPARTMENT_OTHER): Admitting: Radiology

## 2021-12-08 ENCOUNTER — Other Ambulatory Visit: Payer: Self-pay

## 2021-12-08 ENCOUNTER — Emergency Department (HOSPITAL_BASED_OUTPATIENT_CLINIC_OR_DEPARTMENT_OTHER)
Admission: EM | Admit: 2021-12-08 | Discharge: 2021-12-08 | Disposition: A | Discharge status: Home / Self Care | Attending: Emergency Medicine | Admitting: Emergency Medicine

## 2021-12-08 DIAGNOSIS — J209 Acute bronchitis, unspecified: Secondary | ICD-10-CM | POA: Diagnosis not present

## 2021-12-08 DIAGNOSIS — R051 Acute cough: Secondary | ICD-10-CM | POA: Diagnosis present

## 2021-12-08 MED ORDER — AZITHROMYCIN 250 MG PO TABS: 250.0000 mg | ORAL_TABLET | Freq: Every day | ORAL | 0 refills | Status: DC

## 2021-12-08 MED ORDER — ALBUTEROL SULFATE HFA 108 (90 BASE) MCG/ACT IN AERS: 2.0000 | INHALATION_SPRAY | RESPIRATORY_TRACT | Status: DC | PRN

## 2021-12-08 MED ORDER — AZITHROMYCIN 250 MG PO TABS
500.0000 mg | ORAL_TABLET | Freq: Once | ORAL | Status: AC
  Administered 2021-12-08: 500 mg via ORAL
  Filled 2021-12-08: qty 2

## 2021-12-08 MED ORDER — HYDROCOD POLI-CHLORPHE POLI ER 10-8 MG/5ML PO SUER: 5.0000 mL | Freq: Two times a day (BID) | ORAL | 0 refills | Status: DC | PRN

## 2021-12-08 NOTE — ED Provider Notes (Signed)
[facility]   CSN: [id number] Arrival date & time: 12/08/21  1637     History {Add pertinent medical, surgical, social history, OB history to HPI:1} Chief Complaint  Patient presents with   Shortness of Breath   Cough    Shirley Lee is a 61 y.o. female.  She is here with a complaint of nonproductive cough its been going on for 2 weeks.  She said she tested negative for flu and COVID.  She has been put on prednisone, Phenergan cough medicine, albuterol inhaler without any improvement.  She says it started after taking care of her grandkids.  She said everything hurts in her chest and back now from coughing so much.  The history is provided by the patient.  Shortness of Breath Severity:  Moderate Onset quality:  Gradual Duration:  2 weeks Timing:  Intermittent Progression:  Unchanged Chronicity:  New Context: URI   Relieved by:  Nothing Worsened by:  Activity and coughing Ineffective treatments:  Inhaler Associated symptoms: chest pain, cough and headaches   Associated symptoms: no abdominal pain, no fever and no hemoptysis   Risk factors: no tobacco use        Home Medications Prior to Admission medications   Medication Sig Start Date End Date Taking? Authorizing Provider  albuterol (VENTOLIN HFA) 108 (90 Base) MCG/ACT inhaler Inhale 1-2 puffs into the lungs every 6 (six) hours as needed for wheezing or shortness of breath. 12/04/21   Nche, Bonna Gainsharlotte Lum, NP  amphetamine-dextroamphetamine (ADDERALL XR) 30 MG 24 hr capsule Take 1 capsule (30 mg total) by mouth daily. 01/27/22   Nche, Bonna Gainsharlotte Lum, NP  nabumetone (RELAFEN) 500 MG tablet Take 1 tablet (500 mg total) by mouth 2 (two) times daily as needed (cervical muscle tenderness). 11/27/21   Nche, Bonna Gainsharlotte Lum, NP  promethazine-dextromethorphan (PROMETHAZINE-DM) 6.25-15 MG/5ML syrup Take 5 mLs by mouth 3 (three) times daily as needed for cough. 12/04/21   Nche, Bonna Gainsharlotte  Lum, NP      Allergies    Penicillins    Review of Systems   Review of Systems  Constitutional:  Negative for fever.  Eyes:  Negative for visual disturbance.  Respiratory:  Positive for cough. Negative for hemoptysis.   Cardiovascular:  Positive for chest pain.  Gastrointestinal:  Negative for abdominal pain.  Musculoskeletal:  Positive for back pain.  Neurological:  Positive for headaches.    Physical Exam Updated Vital Signs BP 133/76   Pulse (!) 110   Temp 99.9 F (37.7 C) (Temporal)   Resp 18   Ht 5\' 4"  (1.626 m)   Wt 70.8 kg   LMP  (LMP Unknown)   SpO2 95%   BMI 26.78 kg/m  Physical Exam Vitals and nursing note reviewed.  Constitutional:      General: She is not in acute distress.    Appearance: She is well-developed.  HENT:     Head: Normocephalic and atraumatic.  Eyes:     Conjunctiva/sclera: Conjunctivae normal.  Cardiovascular:     Rate and Rhythm: Regular rhythm. Tachycardia present.     Heart sounds: No murmur heard. Pulmonary:     Effort: Pulmonary effort is normal. No respiratory distress.     Breath sounds: Normal breath sounds.  Abdominal:     Palpations: Abdomen is soft.     Tenderness: There is no abdominal tenderness.  Musculoskeletal:        General: No swelling.     Cervical back: Neck supple.  Right lower leg: No edema.     Left lower leg: No edema.  Skin:    General: Skin is warm and dry.     Capillary Refill: Capillary refill takes less than 2 seconds.  Neurological:     General: No focal deficit present.     Mental Status: She is alert.     ED Results / Procedures / Treatments   Labs (all labs ordered are listed, but only abnormal results are displayed) Labs Reviewed - No data to display  EKG None  Radiology DG Chest 2 View  Result Date: 12/08/2021 CLINICAL DATA:  Shortness of breath EXAM: CHEST - 2 VIEW COMPARISON:  None Available. FINDINGS: The heart size and mediastinal contours are within normal limits. Mild left  basilar opacity. The visualized skeletal structures are unremarkable. IMPRESSION: Mild left basilar opacity, likely atelectasis. Electronically Signed   By: Allegra Lai M.D.   On: 12/08/2021 17:47    Procedures Procedures  {Document cardiac monitor, telemetry assessment procedure when appropriate:1}  Medications Ordered in ED Medications  albuterol (VENTOLIN HFA) 108 (90 Base) MCG/ACT inhaler 2 puff (has no administration in time range)  azithromycin (ZITHROMAX) tablet 500 mg (has no administration in time range)    ED Course/ Medical Decision Making/ A&P                           Medical Decision Making Amount and/or Complexity of Data Reviewed Radiology: ordered.  Risk Prescription drug management.   This patient complains of ***; this involves an extensive number of treatment Options and is a complaint that carries with it a high risk of complications and morbidity. The differential includes ***  I ordered, reviewed and interpreted labs, which included *** I ordered medication *** and reviewed PMP when indicated. I ordered imaging studies which included *** and I independently    visualized and interpreted imaging which showed *** Additional history obtained from *** Previous records obtained and reviewed *** I consulted *** and discussed lab and imaging findings and discussed disposition.  Cardiac monitoring reviewed, *** Social determinants considered, *** Critical Interventions: ***  After the interventions stated above, I reevaluated the patient and found *** Admission and further testing considered, ***   {Document critical care time when appropriate:1} {Document review of labs and clinical decision tools ie heart score, Chads2Vasc2 etc:1}  {Document your independent review of radiology images, and any outside records:1} {Document your discussion with family members, caretakers, and with consultants:1} {Document social determinants of health affecting pt's  care:1} {Document your decision making why or why not admission, treatments were needed:1} Final Clinical Impression(s) / ED Diagnoses Final diagnoses:  None    Rx / DC Orders ED Discharge Orders     None

## 2021-12-08 NOTE — Discharge Instructions (Addendum)
You were seen in the emergency department for continued cough.  Your chest x-ray did not show any signs of pneumonia.  We are treating you with antibiotics and a cough medication.  Please rest and drink plenty of fluids.  Follow-up with your primary care doctor.  Return to the emergency department if any worsening or concerning symptoms.

## 2021-12-08 NOTE — ED Triage Notes (Signed)
Patient arrives with complaints of persistent cough with causing her chest discomfort x2 weeks. Patient has been seen at her PCP and Urgent and treated with medications (prednisone, Promethazine, and inhaler). No relief.  Rates pain a 8/10. Tested negative for covid and flu during the visits to the PCP/urgent care.

## 2021-12-08 NOTE — ED Notes (Signed)
RT note: Pt. has Albuterol inhaler that she received from local UC that she has used and stated that it has not helped.

## 2021-12-10 ENCOUNTER — Telehealth: Payer: Self-pay

## 2021-12-10 NOTE — Telephone Encounter (Signed)
Transition Care Management Follow-up Telephone Call Date of discharge and from where: 12/08/21 [facility] ED. Dx: Acute bronchitis. How have you been since you were released from the hospital? I'm doing ok. Any questions or concerns? No  Items Reviewed: Did the pt receive and understand the discharge instructions provided? Yes  Medications obtained and verified? Yes  Other? No  Any new allergies since your discharge? No  Dietary orders reviewed? No Do you have support at home? Yes   Home Care and Equipment/Supplies: Were home health services ordered? not applicable If so, what is the name of the agency? N/a  Has the agency set up a time to come to the patient's home? not applicable Were any new equipment or medical supplies ordered?  No What is the name of the medical supply agency? N/a Were you able to get the supplies/equipment? not applicable Do you have any questions related to the use of the equipment or supplies? No  Functional Questionnaire: (I = Independent and D = Dependent) ADLs: I  Bathing/Dressing- I  Meal Prep- I  Eating- I  Maintaining continence- I  Transferring/Ambulation- I  Managing Meds- I  Follow up appointments reviewed:  PCP Hospital f/u appt confirmed? Yes  Scheduled to see Dr. Veto Kemps [facility] patient) on 12/12/21 @ 3:00pm. Specialist Hospital f/u appt confirmed? No  Scheduled to see n/a on n/a @ n/a. Are transportation arrangements needed? No  If their condition worsens, is the pt aware to call PCP or go to the Emergency Dept.? Yes Was the patient provided with contact information for the PCP's office or ED? Yes Was to pt encouraged to call back with questions or concerns? Yes  Arvil Persons, RN, BSN RN Clinical Supervisor [facility] [employer]

## 2021-12-12 ENCOUNTER — Ambulatory Visit (INDEPENDENT_AMBULATORY_CARE_PROVIDER_SITE_OTHER): Admitting: Family Medicine

## 2021-12-12 ENCOUNTER — Ambulatory Visit (INDEPENDENT_AMBULATORY_CARE_PROVIDER_SITE_OTHER)

## 2021-12-12 ENCOUNTER — Encounter: Payer: Self-pay | Admitting: Family Medicine

## 2021-12-12 VITALS — BP 116/64 | HR 105 | Temp 98.0°F | Ht 64.0 in | Wt 156.6 lb

## 2021-12-12 DIAGNOSIS — J189 Pneumonia, unspecified organism: Secondary | ICD-10-CM

## 2021-12-12 MED ORDER — CEFPODOXIME PROXETIL 200 MG PO TABS: 200.0000 mg | ORAL_TABLET | Freq: Two times a day (BID) | ORAL | 0 refills | Status: DC

## 2021-12-12 MED ORDER — DOXYCYCLINE HYCLATE 100 MG PO TABS: 100.0000 mg | ORAL_TABLET | Freq: Two times a day (BID) | ORAL | 0 refills | Status: DC

## 2021-12-12 MED ORDER — HYDROCODONE BIT-HOMATROP MBR 5-1.5 MG/5ML PO SOLN: 5.0000 mL | Freq: Three times a day (TID) | ORAL | 0 refills | Status: DC | PRN

## 2021-12-12 NOTE — Progress Notes (Signed)
[facility] [address] [place] [address] Dept: [phone] Dept Fax: [phone]  Office Visit  Subjective:    Patient ID: Shirley Lee, female    DOB: Apr 22, 1960, 61 y.o..   MRN: [id number]  Chief Complaint  Patient presents with   Hospitalization Follow-up    Hospital f/u from 12/08/21 for Bronchitis.  Needs refill on cough syrup.     History of Present Illness:  Patient is in today for follow-up of bronchitis. Ms. Heffler was initially seen by [name] on 11/27/2021 with several days of cough and chills. her COVID and influenza testing was neg. She was treated with OTC approaches to a viral illness. Her symptoms were progressive. She went to an ED in [place] and was treated with a course of prednisone. As her cough was progressively worse after this, she was seen at [facility] ED on 12/08/2021. She had a CXR showing atelectasis. She was treated with a course of azithromycin and cough syrup. She notes that her cough continues to be severe. She has been using an albuterol inhaler, without any improvement. She has remained out of work.  Past Medical History: Patient Active Problem List   Diagnosis Date Noted   Musculoskeletal neck pain 11/27/2021   Adhesive capsulitis of left shoulder 05/18/2019   Lisfranc's sprain, left, subsequent encounter 01/25/2019   Attention deficit disorder (ADD) in adult 04/29/2016   Past Surgical History:  Procedure Laterality Date   ABDOMINAL HYSTERECTOMY  1989   Family History  Problem Relation Age of Onset   Multiple sclerosis Sister    Outpatient Medications Prior to Visit  Medication Sig Dispense Refill   albuterol (VENTOLIN HFA) 108 (90 Base) MCG/ACT inhaler Inhale 1-2 puffs into the lungs every 6 (six) hours as needed for wheezing or shortness of breath. 8 g 0   [START ON 01/27/2022] amphetamine-dextroamphetamine (ADDERALL XR) 30 MG 24 hr capsule Take 1 capsule  (30 mg total) by mouth daily. 30 capsule 0   chlorpheniramine-HYDROcodone (TUSSIONEX) 10-8 MG/5ML Take 5 mLs by mouth every 12 (twelve) hours as needed for cough. 115 mL 0   nabumetone (RELAFEN) 500 MG tablet Take 1 tablet (500 mg total) by mouth 2 (two) times daily as needed (cervical muscle tenderness). 60 tablet 0   azithromycin (ZITHROMAX) 250 MG tablet Take 1 tablet (250 mg total) by mouth daily. 4 tablet 0   No facility-administered medications prior to visit.   Allergies  Allergen Reactions   Penicillins Other (See Comments)    Childhood allergy unknown of sypmtoms     Objective:   Today's Vitals   12/12/21 1445  BP: 116/64  Pulse: (!) 105  Temp: 98 F (36.7 C)  TempSrc: Temporal  SpO2: 97%  Weight: 156 lb 9.6 oz (71 kg)  Height: 5\' 4"  (1.626 m)   Body mass index is 26.88 kg/m.   General: Well developed, well nourished. No acute distress. HEENT: Normocephalic, non-traumatic. PERRL, EOMI. Conjunctiva clear. External ears   normal. The right TM is bulging with yellow fluid and bubbles present behind the drum.   Nose clear without congestion or rhinorrhea. Mucous membranes moist. Oropharynx with   moderate mucous streaking. Good dentition. Lungs: Moderate to severe bouts of congested sounding cough. There are ronchi in the left   mid chest. No wheezing or rales. CV: RRR without murmurs or rubs. Pulses 2+ bilaterally. Psych: Alert and oriented. Normal mood and affect.  Health Maintenance Due  Topic Date Due   HIV  Screening  Never done   DTaP/Tdap/Td (1 - Tdap) Never done   PAP SMEAR-Modifier  Never done   COLONOSCOPY (Pts 45-61yrs Insurance coverage will need to be confirmed)  Never done   MAMMOGRAM  12/26/2019   Imaging: CXR (12/08/2021) IMPRESSION: Mild left basilar opacity, likely atelectasis  CXR- New wedge-shaped infiltrate in the left upper lobe.  Assessment & Plan:   1. Community acquired pneumonia of left upper lobe of lung The CXR shows an  infiltrate c/w pneumonia. As she has been on recent antibiotics, and has a penicillin allergy by history, I will treat her with cefpodoxime and doxycycline. I will renew some cough syrup. I recommend she continue out of work this week. I will see her back in 2 weeks to reassess.  - DG Chest 2 View - cefpodoxime (VANTIN) 200 MG tablet; Take 1 tablet (200 mg total) by mouth 2 (two) times daily.  Dispense: 20 tablet; Refill: 0 - doxycycline (VIBRA-TABS) 100 MG tablet; Take 1 tablet (100 mg total) by mouth 2 (two) times daily.  Dispense: 20 tablet; Refill: 0 - HYDROcodone bit-homatropine (HYCODAN) 5-1.5 MG/5ML syrup; Take 5 mLs by mouth every 8 (eight) hours as needed for cough.  Dispense: 120 mL; Refill: 0   Return in about 2 weeks (around 12/26/2021) for Reassessment with Dr. Veto Kemps.   Loyola Mast, MD

## 2021-12-12 NOTE — Progress Notes (Unsigned)
Pt has had a persistent cough x3 weeks. Pt isn't sure if she has had a fever

## 2021-12-13 ENCOUNTER — Telehealth: Payer: Self-pay | Admitting: Nurse Practitioner

## 2021-12-13 NOTE — Telephone Encounter (Signed)
Pt stated that the pharmacy said she can not have the cough syrup that was yesterday. Please call the pt

## 2021-12-13 NOTE — Telephone Encounter (Signed)
Called pharmacy and not sure what was going on but they are able to run the Prescription and will get it ready for patient.  Called patient to advise.  Dm/cma

## 2021-12-25 ENCOUNTER — Other Ambulatory Visit: Payer: Self-pay | Admitting: Nurse Practitioner

## 2021-12-25 DIAGNOSIS — M542 Cervicalgia: Secondary | ICD-10-CM

## 2021-12-25 NOTE — Telephone Encounter (Signed)
Okay to fill?  Last OV: 11/2021 Next OV: 12/2021

## 2021-12-26 ENCOUNTER — Encounter: Payer: Self-pay | Admitting: Family Medicine

## 2021-12-26 ENCOUNTER — Other Ambulatory Visit: Payer: Self-pay | Admitting: Nurse Practitioner

## 2021-12-26 ENCOUNTER — Ambulatory Visit (INDEPENDENT_AMBULATORY_CARE_PROVIDER_SITE_OTHER): Admitting: Family Medicine

## 2021-12-26 VITALS — BP 130/72 | HR 101 | Temp 97.6°F | Ht 64.0 in | Wt 150.8 lb

## 2021-12-26 DIAGNOSIS — R051 Acute cough: Secondary | ICD-10-CM

## 2021-12-26 DIAGNOSIS — J189 Pneumonia, unspecified organism: Secondary | ICD-10-CM | POA: Diagnosis not present

## 2021-12-26 MED ORDER — HYDROCODONE BIT-HOMATROP MBR 5-1.5 MG/5ML PO SOLN: 5.0000 mL | Freq: Three times a day (TID) | ORAL | 0 refills | Status: DC | PRN

## 2021-12-26 NOTE — Progress Notes (Signed)
[facility] [address] [place] [address] Dept: [phone] Dept Fax: [phone]  Office Visit  Subjective:    Patient ID: Shirley Lee, female    DOB: [birth year], 61 y.o..   MRN: [id number]  Chief Complaint  Patient presents with   Follow-up    2 week f/u Pneumonia.      History of Present Illness:  Patient is in today for reassessment of her recent pneumonia. [name] was initially seen by [name] on 11/27/2021 with several days of cough and chills. Her COVID and influenza testing was neg. She was treated with OTC approaches to a viral illness. Her symptoms were progressive. She went to an ED in [place] and was treated with a course of prednisone. As her cough was progressively worse after this, she was seen at [facility] ED on 12/08/2021. She had a CXR showing atelectasis. She was treated with a course of azithromycin and cough syrup. As her cough continued to be severe, I had repeated her CXR. I felt I was seeing some left lung pneumonia. I switched her to a course of cefpodoxime and doxycycline. She has completed both at this point. She notes her cough is not as continuous as before. She does still have some significant cough, but overall may be a little better.  Past Medical History: Patient Active Problem List   Diagnosis Date Noted   Musculoskeletal neck pain 11/27/2021   Adhesive capsulitis of left shoulder 05/18/2019   Lisfranc's sprain, left, subsequent encounter 01/25/2019   Attention deficit disorder (ADD) in adult 04/29/2016   Past Surgical History:  Procedure Laterality Date   ABDOMINAL HYSTERECTOMY  1989   Family History  Problem Relation Age of Onset   Multiple sclerosis Sister    Outpatient Medications Prior to Visit  Medication Sig Dispense Refill   albuterol (VENTOLIN HFA) 108 (90 Base) MCG/ACT inhaler INHALE 1 TO 2 PUFFS INTO THE LUNGS EVERY 6 HOURS AS NEEDED FOR WHEEZING  OR SHORTNESS OF BREATH 6.7 g 2   [START ON 01/27/2022] amphetamine-dextroamphetamine (ADDERALL XR) 30 MG 24 hr capsule Take 1 capsule (30 mg total) by mouth daily. 30 capsule 0   nabumetone (RELAFEN) 500 MG tablet Take 1 tablet (500 mg total) by mouth 2 (two) times daily as needed (cervical muscle tenderness). 60 tablet 0   cefpodoxime (VANTIN) 200 MG tablet Take 1 tablet (200 mg total) by mouth 2 (two) times daily. 20 tablet 0   chlorpheniramine-HYDROcodone (TUSSIONEX) 10-8 MG/5ML Take 5 mLs by mouth every 12 (twelve) hours as needed for cough. 115 mL 0   doxycycline (VIBRA-TABS) 100 MG tablet Take 1 tablet (100 mg total) by mouth 2 (two) times daily. 20 tablet 0   HYDROcodone bit-homatropine (HYCODAN) 5-1.5 MG/5ML syrup Take 5 mLs by mouth every 8 (eight) hours as needed for cough. 120 mL 0   No facility-administered medications prior to visit.   Allergies  Allergen Reactions   Penicillins Other (See Comments)    Childhood allergy unknown of sypmtoms    Objective:   Today's Vitals   12/26/21 1402  BP: 130/72  Pulse: (!) 101  Temp: 97.6 F (36.4 C)  TempSrc: Temporal  SpO2: 96%  Weight: 150 lb 12.8 oz (68.4 kg)  Height: 5\' 4"  (1.626 m)   Body mass index is 25.88 kg/m.   General: Well developed, well nourished. No acute distress. Lungs: Clear to auscultation bilaterally. No wheezing, rales or rhonchi. Moderate coughing spells. Psych: Alert and oriented.  Normal mood and affect.  Health Maintenance Due  Topic Date Due   HIV Screening  Never done   DTaP/Tdap/Td (1 - Tdap) Never done   PAP SMEAR-Modifier  Never done   COLONOSCOPY (Pts 45-21yrs Insurance coverage will need to be confirmed)  Never done   COVID-19 Vaccine (3 - Pfizer risk series) 05/30/2019   MAMMOGRAM  12/26/2019     Assessment & Plan:   1. Community acquired pneumonia of left upper lobe of lung I suspect this is resolving at this point. I will renew her cough syrup. I will plan to see her back in 2 weeks. If  the cough persists at that point, I would consider a chest CT for further assessment.  - HYDROcodone bit-homatropine (HYCODAN) 5-1.5 MG/5ML syrup; Take 5 mLs by mouth every 8 (eight) hours as needed for cough.  Dispense: 120 mL; Refill: 0   Return in about 2 weeks (around 01/09/2022) for Reassessment.   [name], MD

## 2022-01-09 ENCOUNTER — Encounter: Payer: Self-pay | Admitting: Family Medicine

## 2022-01-09 ENCOUNTER — Ambulatory Visit (INDEPENDENT_AMBULATORY_CARE_PROVIDER_SITE_OTHER): Admitting: Family Medicine

## 2022-01-09 VITALS — BP 118/70 | Temp 97.0°F | Ht 64.0 in | Wt 154.8 lb

## 2022-01-09 DIAGNOSIS — J189 Pneumonia, unspecified organism: Secondary | ICD-10-CM

## 2022-01-09 NOTE — Progress Notes (Signed)
[facility] [address] [place] [address] Dept: [phone] Dept Fax: [phone]  Office Visit  Subjective:    Patient ID: Shirley Lee, female    DOB: [birth year], 61 y.o..   MRN: [id number]  Chief Complaint  Patient presents with   Follow-up    2 week f/u. States feeling better, but still has a little bit of a cough.     History of Present Illness:  Patient is in today for a follow-up regarding her recent pneumonia. Shirley Lee was initially seen by Shirley Lee on 11/27/2021 with several days of cough and chills. Her COVID and influenza testing were negative. She was treated with OTC approaches to a viral illness. Her symptoms were progressive. She went to an ED in [place] and was treated with a course of prednisone. As her cough was progressively worse after this, she was seen at [facility] ED on 12/08/2021. She had a CXR showing atelectasis. She was treated with a course of azithromycin and cough syrup. As her cough continued to be severe, I had repeated her CXR. I felt I was seeing some left lung pneumonia. I switched her to a course of cefpodoxime and doxycycline. On 12/13 she was starting to show some improvement. She notes over the past 2 weeks she has continued to improve. She is still having some minor cough, but much better than before.  Past Medical History: Patient Active Problem List   Diagnosis Date Noted   Musculoskeletal neck pain 11/27/2021   Adhesive capsulitis of left shoulder 05/18/2019   Lisfranc's sprain, left, subsequent encounter 01/25/2019   Attention deficit disorder (ADD) in adult 04/29/2016   Past Surgical History:  Procedure Laterality Date   ABDOMINAL HYSTERECTOMY  1989   Family History  Problem Relation Age of Onset   Multiple sclerosis Sister    Outpatient Medications Prior to Visit  Medication Sig Dispense Refill   albuterol (VENTOLIN HFA) 108 (90 Base) MCG/ACT  inhaler INHALE 1 TO 2 PUFFS INTO THE LUNGS EVERY 6 HOURS AS NEEDED FOR WHEEZING OR SHORTNESS OF BREATH 6.7 g 2   [START ON 01/27/2022] amphetamine-dextroamphetamine (ADDERALL XR) 30 MG 24 hr capsule Take 1 capsule (30 mg total) by mouth daily. 30 capsule 0   nabumetone (RELAFEN) 500 MG tablet TAKE 1 TABLET BY MOUTH TWICE DAILY AS NEEDED FOR CERVICAL MUSCLE TENDERNESS 60 tablet 1   HYDROcodone bit-homatropine (HYCODAN) 5-1.5 MG/5ML syrup Take 5 mLs by mouth every 8 (eight) hours as needed for cough. 120 mL 0   No facility-administered medications prior to visit.    Allergies  Allergen Reactions   Penicillins Other (See Comments)    Childhood allergy unknown of sypmtoms     Objective:   Today's Vitals   01/09/22 0818  BP: 118/70  Temp: (!) 97 F (36.1 C)  TempSrc: Temporal  Weight: 154 lb 12.8 oz (70.2 kg)  Height: 5\' 4"  (1.626 m)   Body mass index is 26.57 kg/m.   General: Well developed, well nourished. No acute distress. Lungs: Clear to auscultation bilaterally. No wheezing, rales or rhonchi. Psych: Alert and oriented. Normal mood and affect.  Health Maintenance Due  Topic Date Due   HIV Screening  Never done   DTaP/Tdap/Td (1 - Tdap) Never done   PAP SMEAR-Modifier  Never done   COLONOSCOPY (Pts 45-46yrs Insurance coverage will need to be confirmed)  Never done   MAMMOGRAM  12/26/2019     Assessment & Plan:   1.  Community acquired pneumonia of left upper lobe of lung Improved. I recommend we continue to give her time for recovery. I asked her to follow-up with Shirley Lee in 3 weeks, at which time she should have a follow-up chest x-ray.  Return in about 3 weeks (around 01/30/2022) for Reassessment, follow-up CXR with PCP.   Loyola Mast, MD

## 2022-01-17 ENCOUNTER — Encounter (HOSPITAL_BASED_OUTPATIENT_CLINIC_OR_DEPARTMENT_OTHER): Payer: Self-pay | Admitting: Family Medicine

## 2022-01-17 ENCOUNTER — Ambulatory Visit (INDEPENDENT_AMBULATORY_CARE_PROVIDER_SITE_OTHER): Admitting: Family Medicine

## 2022-01-17 DIAGNOSIS — Z8701 Personal history of pneumonia (recurrent): Secondary | ICD-10-CM

## 2022-01-17 DIAGNOSIS — R7401 Elevation of levels of liver transaminase levels: Secondary | ICD-10-CM | POA: Insufficient documentation

## 2022-01-17 DIAGNOSIS — E785 Hyperlipidemia, unspecified: Secondary | ICD-10-CM | POA: Insufficient documentation

## 2022-01-17 DIAGNOSIS — F988 Other specified behavioral and emotional disorders with onset usually occurring in childhood and adolescence: Secondary | ICD-10-CM

## 2022-01-17 HISTORY — DX: Personal history of pneumonia (recurrent): Z87.01

## 2022-01-17 NOTE — Assessment & Plan Note (Signed)
Noted on recent labs with prior PCP.  We discussed general recommendations related to these findings, labs indicated slightly elevated total cholesterol as well as LDL.  Calculated ASCVD risk score is 4%, discussed meaning of this.  For now, would recommend focusing on lifestyle modifications including reduction of saturated fat consumption, red meat, processed foods in the diet.  Would recommend focusing on incorporating fresh fruits and vegetables, lean protein into diet.  Regular moderate intensity aerobic activity is also recommended

## 2022-01-17 NOTE — Assessment & Plan Note (Signed)
Patient is doing much better now following antibiotic therapy.  She does continue to improve, still has lingering intermittent cough.  On exam today, patient is in no acute distress, vital signs stable, pulmonary exam is normal. Given progress thus far, certainly feel that patient has been improving and can continue with monitoring at this time.  Given improvement as expected following antibiotic therapy, do not feel that x-rays will be needed at this time.  If she does have any lingering symptoms or if any worsening does occur, would proceed with chest x-rays at that time

## 2022-01-17 NOTE — Assessment & Plan Note (Signed)
Patient feels that she has been doing well with resumption of Adderall.  PDMP reviewed, no red flags observed.  We did discuss potential side effects related to medication and consideration for dose adjustment to see if this would have impact on issues with insomnia.  For now, she would prefer to continue with same dosing for medication without any adjustments to be made today.  She does not need refill of medications today

## 2022-01-17 NOTE — Assessment & Plan Note (Signed)
Noted on recent labs with prior PCP.  Elevation was very mild.  Discussed potential causes for this.  Did reviewed prior labs available through care everywhere and most recent laboratory finding was similar to labs from several years ago.  Feel that this slight elevation is not clinically significant, could be related to underlying fatty liver disease.  We discussed general recommendations regarding potential for fatty liver disease and focus on lifestyle modifications.  We will plan to repeat labs in the future for monitoring and assess for any significant changes.  If liver enzymes are elevated in the future, consider additional laboratory evaluation and possible ultrasound of liver to evaluate further

## 2022-01-17 NOTE — Patient Instructions (Signed)
  Medication Instructions:  Your physician recommends that you continue on your current medications as directed. Please refer to the Current Medication list given to you today. --If you need a refill on any your medications before your next appointment, please call your pharmacy first. If no refills are authorized on file call the office.-- Lab Work: Your physician has recommended that you have lab work today: No If you have labs (blood work) drawn today and your tests are completely normal, you will receive your results via MyChart message OR a phone call from our staff.  Please ensure you check your voicemail in the event that you authorized detailed messages to be left on a delegated number. If you have any lab test that is abnormal or we need to change your treatment, we will call you to review the results.  Referrals/Procedures/Imaging: No  Follow-Up: Your next appointment:   Your physician recommends that you schedule a follow-up appointment in: 3 month with Dr. de Cuba.  You will receive a text message or e-mail with a link to a survey about your care and experience with us today! We would greatly appreciate your feedback!   Thanks for letting us be apart of your health journey!!  Primary Care and Sports Medicine   Dr. Raymond de Cuba   We encourage you to activate your patient portal called "MyChart".  Sign up information is provided on this After Visit Summary.  MyChart is used to connect with patients for Virtual Visits (Telemedicine).  Patients are able to view lab/test results, encounter notes, upcoming appointments, etc.  Non-urgent messages can be sent to your provider as well. To learn more about what you can do with MyChart, please visit --  [email or web address].    

## 2022-01-17 NOTE — Progress Notes (Signed)
New Patient Office Visit  Subjective    Patient ID: Shirley Lee, female    DOB: 06-09-1960  Age: 62 y.o. MRN: [id number]  CC:  Chief Complaint  Patient presents with   New Patient (Initial Visit)    Pt here to establish new care    HPI [name] presents to establish care Last PCP - Wilfred Lacy  Recent pneumonia: Patient recently had ongoing respiratory illness and ultimately was diagnosed with pneumonia and treated with courses of antibiotics.  She has more recently been doing much better.  Still has occasional cough, however symptoms are vastly improved.  She denies any issues with shortness of breath or trouble breathing.  No exertional dyspnea.  ADHD: Reports history of ADHD, had been using Adderall in the past, was utilizing medication for at least 6 consecutive years, however stopped about 9 months or so ago before resuming over the past 1 to 2 months.  This was restarted by her PCP.  She reports that she is currently taking 30 mg extended release dose and has been doing fairly well with this.  As below, she has had some insomnia issues however she reports that this has been going on for many years prior to resuming medication  Insomnia: Issues over the past few years, will have periods of worsening issues. Has tried OTC medications like Tylenol PM.  She has not utilized CBT in the past.  No prescription medications in the past.  She does note that if she is not able to fall asleep right away or awakes during the night, she will get out of bed instead of laying in bed for a long period of time.  Occasionally, she will watch TV when she gets out of bed before subsequently falling back asleep.  Patient is originally from [place], has lived here since 2017, was in [place] before here. 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 Outside of work, she enjoys spending time with family, new grandmother, community involvement.  Outpatient  Encounter Medications as of 01/17/2022  Medication Sig   albuterol (VENTOLIN HFA) 108 (90 Base) MCG/ACT inhaler INHALE 1 TO 2 PUFFS INTO THE LUNGS EVERY 6 HOURS AS NEEDED FOR WHEEZING OR SHORTNESS OF BREATH   [START ON 01/27/2022] amphetamine-dextroamphetamine (ADDERALL XR) 30 MG 24 hr capsule Take 1 capsule (30 mg total) by mouth daily.   nabumetone (RELAFEN) 500 MG tablet TAKE 1 TABLET BY MOUTH TWICE DAILY AS NEEDED FOR CERVICAL MUSCLE TENDERNESS   No facility-administered encounter medications on file as of 01/17/2022.    Past Medical History:  Diagnosis Date   Adult ADHD    Chicken pox    History of colon polyps     Past Surgical History:  Procedure Laterality Date   ABDOMINAL HYSTERECTOMY  1989    Family History  Problem Relation Age of Onset   Multiple sclerosis Sister     Social History   Socioeconomic History   Marital status: Married    Spouse name: Not on file   Number of children: Not on file   Years of education: Not on file   Highest education level: Not on file  Occupational History   Not on file  Tobacco Use   Smoking status: Never   Smokeless tobacco: Never  Vaping Use   Vaping Use: Never used  Substance and Sexual Activity   Alcohol use: Yes    Alcohol/week: 3.0 standard drinks of alcohol    Types: 3 Glasses of wine  per week   Drug use: Never   Sexual activity: Yes    Birth control/protection: Surgical  Other Topics Concern   Not on file  Social History Narrative   Not on file   Social Determinants of Health   Financial Resource Strain: Not on file  Food Insecurity: Not on file  Transportation Needs: Not on file  Physical Activity: Not on file  Stress: Not on file  Social Connections: Not on file  Intimate Partner Violence: Not on file    Objective    BP 133/82 (BP Location: Right Arm, Patient Position: Sitting, Cuff Size: Large)   Pulse (!) 103   Ht 5\' 4"  (1.626 m)   Wt 149 lb (67.6 kg)   LMP  (LMP Unknown)   SpO2 100%   BMI 25.58  kg/m   Physical Exam  62 year old female in no acute distress Cardiovascular exam with regular rate and rhythm Lungs clear to auscultation bilaterally  Assessment & Plan:   Problem List Items Addressed This Visit       Other   Attention deficit disorder (ADD) in adult    Patient feels that she has been doing well with resumption of Adderall.  PDMP reviewed, no red flags observed.  We did discuss potential side effects related to medication and consideration for dose adjustment to see if this would have impact on issues with insomnia.  For now, she would prefer to continue with same dosing for medication without any adjustments to be made today.  She does not need refill of medications today      History of pneumonia    Patient is doing much better now following antibiotic therapy.  She does continue to improve, still has lingering intermittent cough.  On exam today, patient is in no acute distress, vital signs stable, pulmonary exam is normal. Given progress thus far, certainly feel that patient has been improving and can continue with monitoring at this time.  Given improvement as expected following antibiotic therapy, do not feel that x-rays will be needed at this time.  If she does have any lingering symptoms or if any worsening does occur, would proceed with chest x-rays at that time      Elevated alanine aminotransferase (ALT) level    Noted on recent labs with prior PCP.  Elevation was very mild.  Discussed potential causes for this.  Did reviewed prior labs available through care everywhere and most recent laboratory finding was similar to labs from several years ago.  Feel that this slight elevation is not clinically significant, could be related to underlying fatty liver disease.  We discussed general recommendations regarding potential for fatty liver disease and focus on lifestyle modifications.  We will plan to repeat labs in the future for monitoring and assess for any  significant changes.  If liver enzymes are elevated in the future, consider additional laboratory evaluation and possible ultrasound of liver to evaluate further      Hyperlipidemia    Noted on recent labs with prior PCP.  We discussed general recommendations related to these findings, labs indicated slightly elevated total cholesterol as well as LDL.  Calculated ASCVD risk score is 4%, discussed meaning of this.  For now, would recommend focusing on lifestyle modifications including reduction of saturated fat consumption, red meat, processed foods in the diet.  Would recommend focusing on incorporating fresh fruits and vegetables, lean protein into diet.  Regular moderate intensity aerobic activity is also recommended       Return  in about 3 months (around 04/18/2022).   Chastity Noland J De Peru, MD

## 2022-01-23 ENCOUNTER — Telehealth (HOSPITAL_BASED_OUTPATIENT_CLINIC_OR_DEPARTMENT_OTHER): Payer: Self-pay

## 2022-01-23 DIAGNOSIS — F988 Other specified behavioral and emotional disorders with onset usually occurring in childhood and adolescence: Secondary | ICD-10-CM

## 2022-01-23 NOTE — Telephone Encounter (Signed)
Pt called and requested a refill on Adderall xr 30 mg. She would like for it to be sent to the pharmacy on file.   Thanks

## 2022-01-24 MED ORDER — AMPHETAMINE-DEXTROAMPHET ER 30 MG PO CP24: 30.0000 mg | ORAL_CAPSULE | Freq: Every day | ORAL | 0 refills | Status: DC

## 2022-01-24 NOTE — Addendum Note (Signed)
Addended by: DE [place], Kyung Rudd J on: 01/24/2022 08:09 AM   Modules accepted: Orders

## 2022-02-22 ENCOUNTER — Other Ambulatory Visit: Payer: Self-pay | Admitting: Nurse Practitioner

## 2022-02-22 DIAGNOSIS — M542 Cervicalgia: Secondary | ICD-10-CM

## 2022-02-27 ENCOUNTER — Ambulatory Visit: Admitting: Nurse Practitioner

## 2022-03-04 ENCOUNTER — Telehealth (HOSPITAL_BASED_OUTPATIENT_CLINIC_OR_DEPARTMENT_OTHER): Payer: Self-pay

## 2022-03-04 ENCOUNTER — Other Ambulatory Visit (HOSPITAL_BASED_OUTPATIENT_CLINIC_OR_DEPARTMENT_OTHER): Payer: Self-pay

## 2022-03-04 ENCOUNTER — Other Ambulatory Visit: Payer: Self-pay | Admitting: Family Medicine

## 2022-03-04 DIAGNOSIS — F988 Other specified behavioral and emotional disorders with onset usually occurring in childhood and adolescence: Secondary | ICD-10-CM

## 2022-03-04 MED ORDER — AMPHETAMINE-DEXTROAMPHET ER 30 MG PO CP24: 30.0000 mg | ORAL_CAPSULE | Freq: Every day | ORAL | 0 refills | Status: DC

## 2022-03-04 NOTE — Telephone Encounter (Signed)
Error

## 2022-03-04 NOTE — Progress Notes (Signed)
PDMP reviewed. No red flags. Last seen in January by PCP. Refill sent.

## 2022-03-27 ENCOUNTER — Encounter (HOSPITAL_BASED_OUTPATIENT_CLINIC_OR_DEPARTMENT_OTHER): Payer: Self-pay

## 2022-04-16 ENCOUNTER — Encounter: Payer: Self-pay | Admitting: Dermatology

## 2022-04-16 ENCOUNTER — Ambulatory Visit (INDEPENDENT_AMBULATORY_CARE_PROVIDER_SITE_OTHER): Admitting: Dermatology

## 2022-04-16 ENCOUNTER — Other Ambulatory Visit (HOSPITAL_BASED_OUTPATIENT_CLINIC_OR_DEPARTMENT_OTHER): Payer: Self-pay

## 2022-04-16 ENCOUNTER — Other Ambulatory Visit (HOSPITAL_BASED_OUTPATIENT_CLINIC_OR_DEPARTMENT_OTHER): Payer: Self-pay | Admitting: Family Medicine

## 2022-04-16 DIAGNOSIS — L820 Inflamed seborrheic keratosis: Secondary | ICD-10-CM

## 2022-04-16 DIAGNOSIS — L821 Other seborrheic keratosis: Secondary | ICD-10-CM

## 2022-04-16 DIAGNOSIS — Z1283 Encounter for screening for malignant neoplasm of skin: Secondary | ICD-10-CM

## 2022-04-16 DIAGNOSIS — F988 Other specified behavioral and emotional disorders with onset usually occurring in childhood and adolescence: Secondary | ICD-10-CM

## 2022-04-16 DIAGNOSIS — L578 Other skin changes due to chronic exposure to nonionizing radiation: Secondary | ICD-10-CM

## 2022-04-16 DIAGNOSIS — D225 Melanocytic nevi of trunk: Secondary | ICD-10-CM

## 2022-04-16 DIAGNOSIS — W908XXA Exposure to other nonionizing radiation, initial encounter: Secondary | ICD-10-CM

## 2022-04-16 DIAGNOSIS — L814 Other melanin hyperpigmentation: Secondary | ICD-10-CM

## 2022-04-16 DIAGNOSIS — X32XXXA Exposure to sunlight, initial encounter: Secondary | ICD-10-CM

## 2022-04-16 DIAGNOSIS — B079 Viral wart, unspecified: Secondary | ICD-10-CM

## 2022-04-16 MED ORDER — AMPHETAMINE-DEXTROAMPHET ER 30 MG PO CP24: 30.0000 mg | ORAL_CAPSULE | Freq: Every day | ORAL | 0 refills | Status: DC

## 2022-04-16 NOTE — Patient Instructions (Signed)
Cryotherapy Aftercare  Wash gently with soap and water everyday.   Apply Vaseline and Band-Aid daily until healed.   Due to recent changes in healthcare laws, you may see results of your pathology and/or laboratory studies on MyChart before the doctors have had a chance to review them. We understand that in some cases there may be results that are confusing or concerning to you. Please understand that not all results are received at the same time and often the doctors may need to interpret multiple results in order to provide you with the best plan of care or course of treatment. Therefore, we ask that you please give us 2 business days to thoroughly review all your results before contacting the office for clarification. Should we see a critical lab result, you will be contacted sooner.   If You Need Anything After Your Visit  If you have any questions or concerns for your doctor, please call our main line at [phone] If no one answers, please leave a voicemail as directed and we will return your call as soon as possible. Messages left after 4 pm will be answered the following business day.   You may also send us a message via MyChart. We typically respond to MyChart messages within 1-2 business days.  For prescription refills, please ask your pharmacy to contact our office. Our fax number is [phone].  If you have an urgent issue when the clinic is closed that cannot wait until the next business day, you can page your doctor at the number below.    Please note that while we do our best to be available for urgent issues outside of office hours, we are not available 24/7.   If you have an urgent issue and are unable to reach us, you may choose to seek medical care at your doctor's office, retail clinic, urgent care center, or emergency room.  If you have a medical emergency, please immediately call 911 or go to the emergency department. In the event of inclement weather, please call our  main line at [phone] for an update on the status of any delays or closures.  Dermatology Medication Tips: Please keep the boxes that topical medications come in in order to help keep track of the instructions about where and how to use these. Pharmacies typically print the medication instructions only on the boxes and not directly on the medication tubes.   If your medication is too expensive, please contact our office at [phone] or send us a message through MyChart.   We are unable to tell what your co-pay for medications will be in advance as this is different depending on your insurance coverage. However, we may be able to find a substitute medication at lower cost or fill out paperwork to get insurance to cover a needed medication.   If a prior authorization is required to get your medication covered by your insurance company, please allow us 1-2 business days to complete this process.  Drug prices often vary depending on where the prescription is filled and some pharmacies may offer cheaper prices.  The website www.goodrx.com contains coupons for medications through different pharmacies. The prices here do not account for what the cost may be with help from insurance (it may be cheaper with your insurance), but the website can give you the price if you did not use any insurance.  - You can print the associated coupon and take it with your prescription to the pharmacy.  - You may also   stop by our office during regular business hours and pick up a GoodRx coupon card.  - If you need your prescription sent electronically to a different pharmacy, notify our office through [facility] MyChart or by phone at [phone]     

## 2022-04-16 NOTE — Progress Notes (Signed)
   New Patient Visit  Subjective  Shirley Lee is a 62 y.o. female who presents for the following: Annual Exam (Patient is here for a full body skin check. C/o spots on neck and back. Spot on neck wants removed due to irritation. No hx of skin cancer and no family hx of skin cancer. Has not had any spots removed that have come back abnormal. ).    Objective  Well appearing patient in no apparent distress; mood and affect are within normal limits.  A full examination was performed including scalp, head, eyes, ears, nose, lips, neck, chest, axillae, abdomen, back, buttocks, bilateral upper extremities, bilateral lower extremities, hands, feet, fingers, toes, fingernails, and toenails. All findings within normal limits unless otherwise noted below.  Chest, back (9) Stuck-on verrucous, tan-brown papules and plaques. --Discussed benign etiology and prognosis   Left Anterior Neck Verrucous papules    Assessment & Plan  Seborrheic keratosis, inflamed (9) Chest, back  I counseled the patient regarding the following: Skin Care: Seborrheic Keratoses are benign. No treatment is necessary. Expectations: Seborrheic Keratoses are benign warty growths. Patients get more of them as they age.   Destruction of lesion - Chest, back Complexity: simple   Destruction method: cryotherapy   Informed consent: discussed and consent obtained   Timeout:  patient name, date of birth, surgical site, and procedure verified Lesion destroyed using liquid nitrogen: Yes   Region frozen until ice ball extended beyond lesion: Yes   Outcome: patient tolerated procedure well with no complications   Post-procedure details: wound care instructions given    Viral warts, unspecified type Left Anterior Neck  Discussed viral wart with patient. Plan is to LN2  Destruction of lesion - Left Anterior Neck Complexity: simple   Destruction method: cryotherapy   Informed consent: discussed and consent obtained    Timeout:  patient name, date of birth, surgical site, and procedure verified Lesion destroyed using liquid nitrogen: Yes   Region frozen until ice ball extended beyond lesion: Yes   Outcome: patient tolerated procedure well with no complications   Post-procedure details: wound care instructions given     LENTIGINES, SEBORRHEIC KERATOSES, HEMANGIOMAS - Benign normal skin lesions - Benign-appearing - Call for any changes  MELANOCYTIC NEVI - Tan-brown and/or pink-flesh-colored symmetric macules and papules - Benign appearing on exam today - Observation - Call clinic for new or changing moles - Recommend daily use of broad spectrum spf 30+ sunscreen to sun-exposed areas.   ACTINIC DAMAGE - Chronic condition, secondary to cumulative UV/sun exposure - diffuse scaly erythematous macules with underlying dyspigmentation - Recommend daily broad spectrum sunscreen SPF 30+ to sun-exposed areas, reapply every 2 hours as needed.  - Staying in the shade or wearing long sleeves, sun glasses (UVA+UVB protection) and wide brim hats (4-inch brim around the entire circumference of the hat) are also recommended for sun protection.  - Call for new or changing lesions.  SKIN CANCER SCREENING PERFORMED TODAY  No follow-ups on file.  I, Zigmund Gottron, CMA, am acting as scribe for Ellard Artis, MD.  Documentation: I have reviewed the above documentation for accuracy and completeness, and I agree with the above  [place], DO

## 2022-04-19 ENCOUNTER — Encounter (HOSPITAL_BASED_OUTPATIENT_CLINIC_OR_DEPARTMENT_OTHER): Payer: Self-pay | Admitting: Family Medicine

## 2022-04-19 ENCOUNTER — Ambulatory Visit (INDEPENDENT_AMBULATORY_CARE_PROVIDER_SITE_OTHER): Admitting: Family Medicine

## 2022-04-19 VITALS — BP 132/76 | HR 90 | Ht 64.0 in | Wt 147.4 lb

## 2022-04-19 DIAGNOSIS — R7401 Elevation of levels of liver transaminase levels: Secondary | ICD-10-CM | POA: Diagnosis not present

## 2022-04-19 DIAGNOSIS — F988 Other specified behavioral and emotional disorders with onset usually occurring in childhood and adolescence: Secondary | ICD-10-CM

## 2022-04-19 DIAGNOSIS — E785 Hyperlipidemia, unspecified: Secondary | ICD-10-CM | POA: Diagnosis not present

## 2022-04-19 MED ORDER — AMPHETAMINE-DEXTROAMPHET ER 30 MG PO CP24: 30.0000 mg | ORAL_CAPSULE | Freq: Every day | ORAL | 0 refills | Status: DC

## 2022-04-19 NOTE — Progress Notes (Signed)
   Established Patient Office Visit  Subjective   Patient ID: Shirley Lee, female    DOB: April 14, 1960  Age: 62 y.o. MRN: [id number]  Chief Complaint  Patient presents with   Medical Management of Chronic Issues    Shirley Lee is a 62 yo female who presents today for follow-up of her chronic medical conditions.   ADHD: She is currently taking Adderall XR 30mg . She reports she has a history of insomnia but does not feel this medication is interfering with her sleep. Repots her attention and concentration are improved on this dose.   HLD: in Nov 2023 her lipid level was slightly elevated. Plan to redraw lipid panel today   Review of Systems  Constitutional:  Negative for malaise/fatigue.  Eyes:  Negative for blurred vision and double vision.  Respiratory:  Negative for cough and shortness of breath.   Cardiovascular:  Negative for chest pain.  Gastrointestinal:  Negative for abdominal pain, nausea and vomiting.  Neurological:  Negative for dizziness and headaches.  Psychiatric/Behavioral:  Negative for suicidal ideas.       Objective:     BP 132/76   Pulse 90   Ht 5\' 4"  (1.626 m)   Wt 147 lb 6.4 oz (66.9 kg)   LMP  (LMP Unknown)   SpO2 98%   BMI 25.30 kg/m  BP Readings from Last 3 Encounters:  04/19/22 132/76  01/17/22 133/82  01/09/22 118/70     Physical Exam Constitutional:      Appearance: Normal appearance.  Cardiovascular:     Rate and Rhythm: Normal rate and regular rhythm.     Pulses: Normal pulses.     Heart sounds: Normal heart sounds.  Pulmonary:     Effort: Pulmonary effort is normal.     Breath sounds: Normal breath sounds.  Neurological:     Mental Status: She is alert.      Assessment & Plan:   1. Attention deficit disorder (ADD) in adult Patient feels she is doing well on current regimen Adderall XR 30mg  daily. Sent on 04/16/2022 to [facility] in [place], [place]; however, patient reports they are out of stock. PDMP reviewed, no red flags  observed. Sent refill to [facility] in [place], [place]. Discuss potential side effects related to medication and consideration for dose adjustment to see if this would have impact on issues with insomnia. She would prefer to continue with same dosing for medication without any adjustments to be made today.  - amphetamine-dextroamphetamine (ADDERALL XR) 30 MG 24 hr capsule; Take 1 capsule (30 mg total) by mouth daily.  Dispense: 30 capsule; Refill: 0  2. Hyperlipidemia, unspecified hyperlipidemia type Last lipid panel completed 4 months ago. Total cholesterol and LDL cholesterol elevated. Patient has been focused on lifestyle modifications, including diet and exercise. Patient is not fasting today. Will plan for fasting labs next week.  - Lipid panel; Future   3. Elevated alanine aminotransferase (ALT) level Noted on recent labs 4 months ago. Elevation was very mild. Patient has been focusing on lifestyle modifications. Plan to recheck liver enzymes and assess for any significant changes. If liver enzymes remain elevated, consider additional labs and possible ultrasound of the liver.  - Comprehensive metabolic panel; Future    Return in about 3 months (around 07/19/2022) for ADHD f/u.    Shirley Reedy, FNP

## 2022-05-30 ENCOUNTER — Telehealth (HOSPITAL_BASED_OUTPATIENT_CLINIC_OR_DEPARTMENT_OTHER): Payer: Self-pay | Admitting: Family Medicine

## 2022-05-30 NOTE — Telephone Encounter (Signed)
Please send Adderall to the [facility] in [place] [address].  Thanks

## 2022-05-31 ENCOUNTER — Other Ambulatory Visit (HOSPITAL_BASED_OUTPATIENT_CLINIC_OR_DEPARTMENT_OTHER): Payer: Self-pay

## 2022-05-31 DIAGNOSIS — F988 Other specified behavioral and emotional disorders with onset usually occurring in childhood and adolescence: Secondary | ICD-10-CM

## 2022-05-31 NOTE — Telephone Encounter (Signed)
Request sent to PCP

## 2022-06-03 MED ORDER — AMPHETAMINE-DEXTROAMPHET ER 30 MG PO CP24: 30.0000 mg | ORAL_CAPSULE | Freq: Every day | ORAL | 0 refills | Status: DC

## 2022-06-05 ENCOUNTER — Encounter: Payer: Self-pay | Admitting: Dermatology

## 2022-06-05 ENCOUNTER — Ambulatory Visit (INDEPENDENT_AMBULATORY_CARE_PROVIDER_SITE_OTHER): Admitting: Dermatology

## 2022-06-05 DIAGNOSIS — B079 Viral wart, unspecified: Secondary | ICD-10-CM

## 2022-06-05 NOTE — Progress Notes (Signed)
   Follow-Up Visit   Subjective  Shirley Lee is a 62 y.o. female who presents for the following: Follow up Warts  Patient was seen on 04/16/22 for a FBSE. She received LN2 treatments on warts at that time. Patient states that wart under left eye and middle of chest did not go away.    The following portions of the chart were reviewed this encounter and updated as appropriate: medications, allergies, medical history  Review of Systems:  No other skin or systemic complaints except as noted in HPI or Assessment and Plan.  Objective  Well appearing patient in no apparent distress; mood and affect are within normal limits.  A focused examination was performed of the following areas: Under left eye and middle of chest   Relevant exam findings are noted in the Assessment and Plan.  Chest - Medial [facility]), Left Malar Cheek Verrucous papules     Assessment & Plan     Viral warts, unspecified type (2) Chest - Medial (Center); Left Malar Cheek  Destruction of lesion - Chest - Medial (Center), Left Malar Cheek  Destruction method: cryotherapy   Informed consent: discussed and consent obtained   Lesion destroyed using liquid nitrogen: Yes   Outcome: patient tolerated procedure well with no complications   Post-procedure details: wound care instructions given      Return if symptoms worsen or fail to improve.    Documentation: I have reviewed the above documentation for accuracy and completeness, and I agree with the above.  Langston Reusing, DO   I, Germaine Pomfret, CMA, am acting as scribe for [employer], DO.

## 2022-06-05 NOTE — Patient Instructions (Signed)
Cryotherapy Aftercare  Wash gently with soap and water everyday.   Apply Vaseline and Band-Aid daily until healed.   Due to recent changes in healthcare laws, you may see results of your pathology and/or laboratory studies on MyChart before the doctors have had a chance to review them. We understand that in some cases there may be results that are confusing or concerning to you. Please understand that not all results are received at the same time and often the doctors may need to interpret multiple results in order to provide you with the best plan of care or course of treatment. Therefore, we ask that you please give us 2 business days to thoroughly review all your results before contacting the office for clarification. Should we see a critical lab result, you will be contacted sooner.   If You Need Anything After Your Visit  If you have any questions or concerns for your doctor, please call our main line at [phone] If no one answers, please leave a voicemail as directed and we will return your call as soon as possible. Messages left after 4 pm will be answered the following business day.   You may also send us a message via MyChart. We typically respond to MyChart messages within 1-2 business days.  For prescription refills, please ask your pharmacy to contact our office. Our fax number is [phone].  If you have an urgent issue when the clinic is closed that cannot wait until the next business day, you can page your doctor at the number below.    Please note that while we do our best to be available for urgent issues outside of office hours, we are not available 24/7.   If you have an urgent issue and are unable to reach us, you may choose to seek medical care at your doctor's office, retail clinic, urgent care center, or emergency room.  If you have a medical emergency, please immediately call 911 or go to the emergency department. In the event of inclement weather, please call our  main line at [phone] for an update on the status of any delays or closures.  Dermatology Medication Tips: Please keep the boxes that topical medications come in in order to help keep track of the instructions about where and how to use these. Pharmacies typically print the medication instructions only on the boxes and not directly on the medication tubes.   If your medication is too expensive, please contact our office at [phone] or send us a message through MyChart.   We are unable to tell what your co-pay for medications will be in advance as this is different depending on your insurance coverage. However, we may be able to find a substitute medication at lower cost or fill out paperwork to get insurance to cover a needed medication.   If a prior authorization is required to get your medication covered by your insurance company, please allow us 1-2 business days to complete this process.  Drug prices often vary depending on where the prescription is filled and some pharmacies may offer cheaper prices.  The website www.goodrx.com contains coupons for medications through different pharmacies. The prices here do not account for what the cost may be with help from insurance (it may be cheaper with your insurance), but the website can give you the price if you did not use any insurance.  - You can print the associated coupon and take it with your prescription to the pharmacy.  - You may also   stop by our office during regular business hours and pick up a GoodRx coupon card.  - If you need your prescription sent electronically to a different pharmacy, notify our office through [facility] MyChart or by phone at [phone]     

## 2022-07-08 ENCOUNTER — Telehealth (HOSPITAL_BASED_OUTPATIENT_CLINIC_OR_DEPARTMENT_OTHER): Payer: Self-pay | Admitting: Family Medicine

## 2022-07-08 DIAGNOSIS — F988 Other specified behavioral and emotional disorders with onset usually occurring in childhood and adolescence: Secondary | ICD-10-CM

## 2022-07-08 NOTE — Telephone Encounter (Signed)
Patient came in to office needs refill for Adderall please send to pharmacy to [facility] in [place]

## 2022-07-12 MED ORDER — AMPHETAMINE-DEXTROAMPHET ER 30 MG PO CP24: 30.0000 mg | ORAL_CAPSULE | Freq: Every day | ORAL | 0 refills | Status: DC

## 2022-07-12 NOTE — Addendum Note (Signed)
Addended by: DE [place], Gloyd Happ J on: 07/12/2022 08:20 AM   Modules accepted: Orders

## 2022-07-16 ENCOUNTER — Ambulatory Visit (HOSPITAL_BASED_OUTPATIENT_CLINIC_OR_DEPARTMENT_OTHER): Admitting: Family Medicine

## 2022-07-19 ENCOUNTER — Ambulatory Visit (HOSPITAL_BASED_OUTPATIENT_CLINIC_OR_DEPARTMENT_OTHER): Admitting: Family Medicine

## 2022-07-30 ENCOUNTER — Ambulatory Visit (INDEPENDENT_AMBULATORY_CARE_PROVIDER_SITE_OTHER): Admitting: Family Medicine

## 2022-07-30 ENCOUNTER — Encounter (HOSPITAL_BASED_OUTPATIENT_CLINIC_OR_DEPARTMENT_OTHER): Payer: Self-pay | Admitting: Family Medicine

## 2022-07-30 VITALS — BP 115/74 | HR 81 | Ht 64.0 in | Wt 138.0 lb

## 2022-07-30 DIAGNOSIS — M436 Torticollis: Secondary | ICD-10-CM | POA: Insufficient documentation

## 2022-07-30 DIAGNOSIS — F988 Other specified behavioral and emotional disorders with onset usually occurring in childhood and adolescence: Secondary | ICD-10-CM | POA: Diagnosis not present

## 2022-07-30 MED ORDER — CYCLOBENZAPRINE HCL 5 MG PO TABS: 5.0000 mg | ORAL_TABLET | Freq: Three times a day (TID) | ORAL | 1 refills | Status: DC | PRN

## 2022-07-30 MED ORDER — MELOXICAM 7.5 MG PO TABS: 7.5000 mg | ORAL_TABLET | Freq: Every day | ORAL | 0 refills | Status: DC

## 2022-07-30 MED ORDER — AMPHETAMINE-DEXTROAMPHET ER 30 MG PO CP24: 30.0000 mg | ORAL_CAPSULE | Freq: Every day | ORAL | 0 refills | Status: DC

## 2022-07-30 NOTE — Assessment & Plan Note (Signed)
Patient reports that she is doing well currently.  Feels the current dose of medication is adequately controlling symptoms.  Denies any issues with sleep, appetite, chest pain or palpitations.  She does not need refill at this time, will be due in about a week or so. PDMP reviewed, no red flags, can go ahead and send prescription which can be filled closer to the time of current prescription management. Will plan to follow-up in about 3 months to monitor progress

## 2022-07-30 NOTE — Assessment & Plan Note (Signed)
Patient reports having intermittent issues with neck stiffness over the years.  She previously has used nabumetone.  Help with symptoms, however feels that it has not been as helpful currently.  She thinks that current issues started after period of long driving.  She does not have any significant pain associated with current symptoms.  Denies any numbness or tingling, no radiation of symptoms into the upper extremities.  No concerns with ambulation. On exam, patient is in no acute distress.  She does not have any tenderness to palpation over spinous processes in the cervical region or through paraspinal muscles.  She does have reduced range of motion with head tilt and rotation.  Normal gait in office. Can make adjustments to current pharmacotherapy.  Recommend holding nabumetone and we can switch to meloxicam and low-dose of Flexeril.  Also feel the patient would do well working with physical therapy, she is amenable to this, referral placed.  Monitor symptoms at follow-up visit.  If continuing to have issues, consider proceeding with x-ray

## 2022-07-30 NOTE — Patient Instructions (Signed)
  Medication Instructions:  Your physician recommends that you continue on your current medications as directed. Please refer to the Current Medication list given to you today. --If you need a refill on any your medications before your next appointment, please call your pharmacy first. If no refills are authorized on file call the office.-- Lab Work: Your physician has recommended that you have lab work today: No If you have labs (blood work) drawn today and your tests are completely normal, you will receive your results via MyChart message OR a phone call from our staff.  Please ensure you check your voicemail in the event that you authorized detailed messages to be left on a delegated number. If you have any lab test that is abnormal or we need to change your treatment, we will call you to review the results.  Referrals/Procedures/Imaging: No  Follow-Up: Your next appointment:   Your physician recommends that you schedule a follow-up appointment in: 3 months with Dr. de [place].  You will receive a text message or e-mail with a link to a survey about your care and experience with us today! We would greatly appreciate your feedback!   Thanks for letting us be apart of your health journey!!  Primary Care and Sports Medicine   Dr. Raymond de Cuba   We encourage you to activate your patient portal called "MyChart".  Sign up information is provided on this After Visit Summary.  MyChart is used to connect with patients for Virtual Visits (Telemedicine).  Patients are able to view lab/test results, encounter notes, upcoming appointments, etc.  Non-urgent messages can be sent to your provider as well. To learn more about what you can do with MyChart, please visit --  [email or web address].    

## 2022-07-30 NOTE — Progress Notes (Signed)
    Procedures performed today:    None.  Independent interpretation of notes and tests performed by another provider:   None.  Brief History, Exam, Impression, and Recommendations:    BP 115/74 (BP Location: Left Arm, Patient Position: Sitting, Cuff Size: Normal)   Pulse 81   Ht 5\' 4"  (1.626 m)   Wt 138 lb (62.6 kg)   LMP  (LMP Unknown)   SpO2 100%   BMI 23.69 kg/m   Neck stiffness Assessment & Plan: Patient reports having intermittent issues with neck stiffness over the years.  She previously has used nabumetone.  Help with symptoms, however feels that it has not been as helpful currently.  She thinks that current issues started after period of long driving.  She does not have any significant pain associated with current symptoms.  Denies any numbness or tingling, no radiation of symptoms into the upper extremities.  No concerns with ambulation. On exam, patient is in no acute distress.  She does not have any tenderness to palpation over spinous processes in the cervical region or through paraspinal muscles.  She does have reduced range of motion with head tilt and rotation.  Normal gait in office. Can make adjustments to current pharmacotherapy.  Recommend holding nabumetone and we can switch to meloxicam and low-dose of Flexeril.  Also feel the patient would do well working with physical therapy, she is amenable to this, referral placed.  Monitor symptoms at follow-up visit.  If continuing to have issues, consider proceeding with x-ray  Orders: -     Ambulatory referral to Physical Therapy  Attention deficit disorder (ADD) in adult Assessment & Plan: Patient reports that she is doing well currently.  Feels the current dose of medication is adequately controlling symptoms.  Denies any issues with sleep, appetite, chest pain or palpitations.  She does not need refill at this time, will be due in about a week or so. PDMP reviewed, no red flags, can go ahead and send prescription  which can be filled closer to the time of current prescription management. Will plan to follow-up in about 3 months to monitor progress  Orders: -     Amphetamine-Dextroamphet ER; Take 1 capsule (30 mg total) by mouth daily.  Dispense: 30 capsule; Refill: 0  Other orders -     Meloxicam; Take 1 tablet (7.5 mg total) by mouth daily.  Dispense: 30 tablet; Refill: 0 -     Cyclobenzaprine HCl; Take 1 tablet (5 mg total) by mouth 3 (three) times daily as needed for muscle spasms.  Dispense: 60 tablet; Refill: 1  Return in about 3 months (around 10/30/2022) for med check.   ___________________________________________ Shirley Pacini de [place], MD, ABFM, [facility] Primary Care and Sports Medicine [facility]

## 2022-07-31 ENCOUNTER — Ambulatory Visit (INDEPENDENT_AMBULATORY_CARE_PROVIDER_SITE_OTHER): Admitting: Dermatology

## 2022-07-31 ENCOUNTER — Encounter (HOSPITAL_BASED_OUTPATIENT_CLINIC_OR_DEPARTMENT_OTHER): Payer: Self-pay | Admitting: *Deleted

## 2022-07-31 ENCOUNTER — Encounter: Payer: Self-pay | Admitting: Dermatology

## 2022-07-31 VITALS — BP 118/79 | HR 93

## 2022-07-31 DIAGNOSIS — B079 Viral wart, unspecified: Secondary | ICD-10-CM | POA: Diagnosis not present

## 2022-07-31 NOTE — Patient Instructions (Signed)
Cryotherapy Aftercare  Wash gently with soap and water everyday.   Apply Vaseline and Band-Aid daily until healed.   Due to recent changes in healthcare laws, you may see results of your pathology and/or laboratory studies on MyChart before the doctors have had a chance to review them. We understand that in some cases there may be results that are confusing or concerning to you. Please understand that not all results are received at the same time and often the doctors may need to interpret multiple results in order to provide you with the best plan of care or course of treatment. Therefore, we ask that you please give [place] 2 business days to thoroughly review all your results before contacting the office for clarification. Should we see a critical lab result, you will be contacted sooner.   If You Need Anything After Your Visit  If you have any questions or concerns for your doctor, please call our main line at [phone] If no one answers, please leave a voicemail as directed and we will return your call as soon as possible. Messages left after 4 pm will be answered the following business day.   You may also send [place] a message via [place]. We typically respond to MyChart messages within 1-2 business days.  For prescription refills, please ask your pharmacy to contact our office. Our fax number is [phone].  If you have an urgent issue when the clinic is closed that cannot wait until the next business day, you can page your doctor at the number below.    Please note that while we do our best to be available for urgent issues outside of office hours, we are not available 24/7.   If you have an urgent issue and are unable to reach [place], you may choose to seek medical care at your doctor's office, retail clinic, urgent care center, or emergency room.  If you have a medical emergency, please immediately call 911 or go to the emergency department. In the event of inclement weather, please call our  main line at [phone] for an update on the status of any delays or closures.  Dermatology Medication Tips: Please keep the boxes that topical medications come in in order to help keep track of the instructions about where and how to use these. Pharmacies typically print the medication instructions only on the boxes and not directly on the medication tubes.   If your medication is too expensive, please contact our office at [phone] or send [place] a message through [place].   We are unable to tell what your co-pay for medications will be in advance as this is different depending on your insurance coverage. However, we may be able to find a substitute medication at lower cost or fill out paperwork to get insurance to cover a needed medication.   If a prior authorization is required to get your medication covered by your insurance company, please allow [place] 1-2 business days to complete this process.  Drug prices often vary depending on where the prescription is filled and some pharmacies may offer cheaper prices.  The website www.goodrx.com contains coupons for medications through different pharmacies. The prices here do not account for what the cost may be with help from insurance (it may be cheaper with your insurance), but the website can give you the price if you did not use any insurance.  - You can print the associated coupon and take it with your prescription to the pharmacy.  - You may also  stop by our office during regular business hours and pick up a GoodRx coupon card.  - If you need your prescription sent electronically to a different pharmacy, notify our office through [facility] or by phone at [phone]

## 2022-07-31 NOTE — Progress Notes (Signed)
   Follow-Up Visit   Subjective  Shirley Lee is a 62 y.o. female who presents for the following: wart on eye  Patient present today for follow up visit for wart under left eye. Patient was last evaluated on 06/05/22. Patient reports wart is Better but not at goal. The wart on her chest did clear though. Patient reports no medication changes.    The following portions of the chart were reviewed this encounter and updated as appropriate: medications, allergies, medical history  Review of Systems:  No other skin or systemic complaints except as noted in HPI or Assessment and Plan.  Objective  Well appearing patient in no apparent distress; mood and affect are within normal limits.   A focused examination was performed of the following areas: Under left eye.  Relevant exam findings are noted in the Assessment and Plan.  Left Malar Cheek Verrucous papule         Assessment & Plan   Wart -. Wart on Chest     - Assessment: Resolved.     - Plan: No further treatment needed.  -. Wart Under the Eye     - Assessment: Partially resolved, still present.      - Plan:           a. Perform cryotherapy during the visit.           b. Instruct patient to apply Aquaphor or Vaseline to the area.           c. Follow up only if the wart does not fall off.  2. Patient Education    - Plan:         a. Provided cryo aftercare instructions.         b. Advised to start applying Aquaphor or Vaseline tonight and continue every morning and night thereafter.  PROCEDURE NOTE  Viral warts, unspecified type Left Malar Cheek  Destruction of lesion - Left Malar Cheek Complexity: simple   Destruction method: cryotherapy   Informed consent: discussed and consent obtained   Timeout:  patient name, date of birth, surgical site, and procedure verified Lesion destroyed using liquid nitrogen: Yes   Region frozen until ice ball extended beyond lesion: Yes   Outcome: patient tolerated  procedure well with no complications   Post-procedure details: wound care instructions given      No follow-ups on file.  Owens Shark, CMA, am acting as scribe for [employer], DO.   Documentation: I have reviewed the above documentation for accuracy and completeness, and I agree with the above.  Langston Reusing, DO

## 2022-08-21 ENCOUNTER — Other Ambulatory Visit (HOSPITAL_BASED_OUTPATIENT_CLINIC_OR_DEPARTMENT_OTHER): Payer: Self-pay | Admitting: Family Medicine

## 2022-08-21 DIAGNOSIS — F988 Other specified behavioral and emotional disorders with onset usually occurring in childhood and adolescence: Secondary | ICD-10-CM

## 2022-08-21 MED ORDER — AMPHETAMINE-DEXTROAMPHET ER 30 MG PO CP24: 30.0000 mg | ORAL_CAPSULE | Freq: Every day | ORAL | 0 refills | Status: DC

## 2022-08-26 ENCOUNTER — Telehealth (INDEPENDENT_AMBULATORY_CARE_PROVIDER_SITE_OTHER): Admitting: Family Medicine

## 2022-08-26 ENCOUNTER — Other Ambulatory Visit (HOSPITAL_BASED_OUTPATIENT_CLINIC_OR_DEPARTMENT_OTHER): Payer: Self-pay | Admitting: Family Medicine

## 2022-08-26 VITALS — Temp 100.5°F | Ht 64.0 in | Wt 138.0 lb

## 2022-08-26 DIAGNOSIS — U071 COVID-19: Secondary | ICD-10-CM | POA: Diagnosis not present

## 2022-08-26 MED ORDER — HYDROCOD POLI-CHLORPHE POLI ER 10-8 MG/5ML PO SUER
5.0000 mL | Freq: Two times a day (BID) | ORAL | 0 refills | Status: DC | PRN
Start: 2022-08-26 — End: 2022-10-30

## 2022-08-26 MED ORDER — NIRMATRELVIR/RITONAVIR (PAXLOVID)TABLET
3.0000 | ORAL_TABLET | Freq: Two times a day (BID) | ORAL | 0 refills | Status: AC
Start: 2022-08-26 — End: 2022-08-31

## 2022-08-26 NOTE — Assessment & Plan Note (Signed)
Patient reports that just prior to the weekend, her husband tested positive for coronavirus.  Patient indicates that her symptoms began on Sunday including fever, cough, body aches.  She has not had coronavirus before.  She indicates that she has received prior COVID vaccines.  She reports history of asthma.  She has been using OTC medications, however continues to have intermittent cough, mild shortness of breath.  Does not have significant dyspnea with performing household activities.  Current medications reviewed in chart. During virtual video visit, patient was able to speak in complete sentences, no significant dyspnea, increased work of breathing.  Patient with occasional coughing.  Discussed treatment considerations with patient.  Given underlying asthma, patient would be utilization of Paxlovid to reduce the risk of progression to severe illness.  Did discuss consideration of utilizing antiviral.  Discussed possible risk, side effects, medication interactions related to this medication.  She elected to proceed with medication.  Advised on management of Adderall in conjunction with taking Paxlovid.  We will also send in cough syrup for patient given reported difficulty with controlling symptoms. Reviewed recommendations related to isolation precautions/physical distancing

## 2022-08-26 NOTE — Progress Notes (Signed)
   Virtual Visit via Telephone   I connected with  Shirley Lee  on 08/26/22 by telephone/telehealth and verified that I am speaking with the correct person using two identifiers. Visit conducted via video.   I discussed the limitations, risks, security and privacy concerns of performing an evaluation and management service by telephone, including the higher likelihood of inaccurate diagnosis and treatment, and the availability of in person appointments.  We also discussed the likely need of an additional face to face encounter for complete and high quality delivery of care.  I also discussed with the patient that there may be a patient responsible charge related to this service. The patient expressed understanding and wishes to proceed.  Provider location is in medical facility. Patient location is at their home, different from provider location. People involved in care of the patient during this telehealth encounter were myself, my nurse/medical assistant, and my front office/scheduling team member.  Review of Systems: No fevers, chills, night sweats, weight loss, chest pain, or shortness of breath.   Objective Findings:    General: Speaking full sentences, no audible heavy breathing.  Sounds alert and appropriately interactive.    Independent interpretation of tests performed by another provider:   None.  Brief History, Exam, Impression, and Recommendations:    COVID-19 Patient reports that just prior to the weekend, her husband tested positive for coronavirus.  Patient indicates that her symptoms began on Sunday including fever, cough, body aches.  She has not had coronavirus before.  She indicates that she has received prior COVID vaccines.  She reports history of asthma.  She has been using OTC medications, however continues to have intermittent cough, mild shortness of breath.  Does not have significant dyspnea with performing household activities.  Current medications  reviewed in chart. During virtual video visit, patient was able to speak in complete sentences, no significant dyspnea, increased work of breathing.  Patient with occasional coughing.  Discussed treatment considerations with patient.  Given underlying asthma, patient would be utilization of Paxlovid to reduce the risk of progression to severe illness.  Did discuss consideration of utilizing antiviral.  Discussed possible risk, side effects, medication interactions related to this medication.  She elected to proceed with medication.  Advised on management of Adderall in conjunction with taking Paxlovid.  We will also send in cough syrup for patient given reported difficulty with controlling symptoms. Reviewed recommendations related to isolation precautions/physical distancing  I discussed the above assessment and treatment plan with the patient. The patient was provided an opportunity to ask questions and all were answered. The patient agreed with the plan and demonstrated an understanding of the instructions.  The patient was advised to call back or seek an in-person evaluation if the symptoms worsen or if the condition fails to improve as anticipated.  I provided 12 minutes of face to face and non-face-to-face time during this encounter date, time was needed to gather information, review chart, records, communicate/coordinate with staff remotely, as well as complete documentation.   ___________________________________________ Willaim Mode de [place], MD, ABFM, CAQSM Primary Care and Sports Medicine [facility]

## 2022-09-16 NOTE — Therapy (Signed)
OUTPATIENT PHYSICAL THERAPY CERVICAL EVALUATION   Patient Name: Shirley Lee MRN: [id number] DOB:[birth year], 62 y.o., female Today's Date: 09/18/2022  END OF SESSION:  PT End of Session - 09/17/22 1509     Visit Number 1    Number of Visits 10    Date for PT Re-Evaluation 10/29/22    Authorization Type [employer]    PT Start Time 1452    PT Stop Time 1539    PT Time Calculation (min) 47 min    Activity Tolerance Patient tolerated treatment well    Behavior During Therapy [facility] for tasks assessed/performed             Past Medical History:  Diagnosis Date   Adult ADHD    Chicken pox    History of colon polyps    History of pneumonia 01/17/2022   Lisfranc's sprain, left, subsequent encounter 01/25/2019   Past Surgical History:  Procedure Laterality Date   ABDOMINAL HYSTERECTOMY  1989   Patient Active Problem List   Diagnosis Date Noted   COVID-19 08/26/2022   Neck stiffness 07/30/2022   Elevated alanine aminotransferase (ALT) level 01/17/2022   Hyperlipidemia 01/17/2022   Adhesive capsulitis of left shoulder 05/18/2019   Attention deficit disorder (ADD) in adult 04/29/2016    PCP: de Peru, Buren Kos, MD  REFERRING PROVIDER: de [place], Raymond J, MD  REFERRING DIAG: M43.6 (ICD-10-CM) - Neck stiffness  THERAPY DIAG:  Cervicalgia  M25.60 Stiffness of unspecified joint, not elsewhere classified  Rationale for Evaluation and Treatment: Rehabilitation  ONSET DATE: PT order 07/30/2022  SUBJECTIVE:                                                                                                                                                                                                         SUBJECTIVE STATEMENT: Pt states she has a stiff neck with limited mobility.  Pt has received botox injections which helped for a while.  Pt reports pain is intermittent though her stiffness is constant.  Pt has had these sx's for aprox 7 years.  Pt denies any specific  MOI.  Pt reports having increased soreness in July after period of long driving.  Pt had x rays approx 3 years ago and was informed she has arthritis.   Pt saw MD on 7/16 who changed her medications and also ordered PT.   Pt reports decreased ROM in cervical.  She has difficulty turning her head and turns her body when trying to turn her head.     Hand dominance: Right  PERTINENT HISTORY:  L shoulder adhesive capsulitis  PAIN:  NPRS:  0/10 current and best pain, 7/10 worst Location:  central cervical Pt denies any radicular sx's including N/T down UE.   PRECAUTIONS: None  RED FLAGS: None     WEIGHT BEARING RESTRICTIONS: No  FALLS:  Has patient fallen in last 6 months? 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  Pt has a standing desk and tries to stand more.   PLOF: Independent  PATIENT GOALS: improve ROM, learn exercises to improve ROM.   OBJECTIVE:   DIAGNOSTIC FINDINGS:  PT unable to view x rays.  Pt states she had cervical x rays approx 3 years which showed arthritis.   PATIENT SURVEYS:  FOTO 59 with a goal of 65 at visit 11  COGNITION: Overall cognitive status: Within functional limits for tasks assessed  SENSATION: 2+ sensation to LT t/o bilat UE dermatomes.   POSTURE: FHP.   PALPATION: TTP:  none in cervical paraspinals and bilat UT.   Pt does have moderate soft tissue tightness in bilat UT and cervical paraspinals.     CERVICAL ROM:   Active ROM A/PROM (deg) eval  Flexion WFL  Extension 30  Right lateral flexion 18  Left lateral flexion 17  Right rotation 39  Left rotation 38   (Blank rows = not tested)  Pt had no pain with cervical AROM  UPPER EXTREMITY ROM:  Active ROM Right eval Left eval  Shoulder flexion 144 148  Shoulder extension    Shoulder abduction 152 158  Shoulder adduction    Shoulder extension    Shoulder internal rotation    Shoulder external  rotation    Elbow flexion    Elbow extension    Wrist flexion    Wrist extension    Wrist ulnar deviation    Wrist radial deviation    Wrist pronation    Wrist supination     (Blank rows = not tested)     TODAY'S TREATMENT:                                                                                                                               Pt had difficulty performing chin tucks with correct form, did not include in HEP.  Pt performed seated cervical rotation x 10 reps bilat, shoulder rolls approx x 15 reps, and seated UT stretch 2x20 sec.  Pt received a HEP handout and was educated in correct form and appropriate frequency.    PATIENT EDUCATION:  Education details: dx, objective findings, relevant anatomy, POC, HEP, and rationale of interventions. Person educated: Patient Education method: Explanation, Demonstration, Verbal cues, and Handouts Education comprehension: verbalized understanding, returned demonstration, verbal cues required, and needs further education  HOME EXERCISE PROGRAM: Access Code: [email or web address] URL: [email or web address] Date: 09/17/2022 Prepared by: Aaron Edelman  Exercises - Seated Cervical Rotation AROM  - 3 x daily - 7 x  weekly - 2 sets - 10 reps - Seated Upper Trapezius Stretch  - 2 x daily - 7 x weekly - 2-3 reps - 20 seconds hold - Standing Backward Shoulder Rolls  - 3 x daily - 7 x weekly - 2 sets - 10 reps  ASSESSMENT:  CLINICAL IMPRESSION: Patient is a 62 y.o. female with a dx of neck stiffness.  Pt has had these sx's for years though reports having increased pain after driving a distance.  Pt reports having difficulty turning her head and turns her body when trying to turn her head.  Pt has decreased AROM t/o cervical and soft tissue tightness in bilat UT and cervical paraspinals.  Pt should benefit from skilled PT services to improve ROM, stiffness, pain, and function.     OBJECTIVE IMPAIRMENTS: decreased ROM, hypomobility,  increased fascial restrictions, increased muscle spasms, impaired flexibility, postural dysfunction, and pain.   ACTIVITY LIMITATIONS:   PARTICIPATION LIMITATIONS: driving  PERSONAL FACTORS: Time since onset of injury/illness/exacerbation are also affecting patient's functional outcome.   REHAB POTENTIAL: Good  CLINICAL DECISION MAKING: Stable/uncomplicated  EVALUATION COMPLEXITY: Low   GOALS:   SHORT TERM GOALS: Target date: 10/08/2022   Pt will be independent and compliant with HEP for improved ROM, pain, and function.  Baseline:  Goal status: INITIAL  2.  Pt will report at least a 25% improvement in cervical stiffness and mobility.  Baseline:  Goal status: INITIAL   LONG TERM GOALS: Target date: 10/29/2022  Pt's worst pain will be < 3/10 for improved daily mobility.  Baseline:  Goal status: INITIAL  2.  Pt will report she is able to turn her head without compensatory movement without significant difficulty for improved mobility with driving.  Baseline:  Goal status: INITIAL  3.  Pt will demo improved cervical AROM to at least 30 deg in Sb'ing and 55 - 60 deg in rotation for improved cervical mobility with ADLs/IADLs. Baseline:  Goal status: INITIAL    PLAN:  PT FREQUENCY: 2x/week  PT DURATION: other: 4-6 weeks  PLANNED INTERVENTIONS: Therapeutic exercises, Therapeutic activity, Neuromuscular re-education, Patient/Family education, Self Care, Joint mobilization, Aquatic Therapy, Dry Needling, Electrical stimulation, Spinal mobilization, Cryotherapy, Moist heat, Taping, Traction, Ultrasound, Manual therapy, and Re-evaluation  PLAN FOR NEXT SESSION: Cervical ROM and soft tissue techniques.  Review and perform HEP.   Audie Clear III PT, DPT 09/18/22 10:52 PM

## 2022-09-17 ENCOUNTER — Ambulatory Visit (HOSPITAL_BASED_OUTPATIENT_CLINIC_OR_DEPARTMENT_OTHER): Attending: Family Medicine | Admitting: Physical Therapy

## 2022-09-17 ENCOUNTER — Encounter (HOSPITAL_BASED_OUTPATIENT_CLINIC_OR_DEPARTMENT_OTHER): Payer: Self-pay | Admitting: Physical Therapy

## 2022-09-17 ENCOUNTER — Other Ambulatory Visit: Payer: Self-pay

## 2022-09-17 DIAGNOSIS — M436 Torticollis: Secondary | ICD-10-CM | POA: Diagnosis present

## 2022-09-17 DIAGNOSIS — M542 Cervicalgia: Secondary | ICD-10-CM | POA: Diagnosis not present

## 2022-09-20 ENCOUNTER — Encounter (HOSPITAL_BASED_OUTPATIENT_CLINIC_OR_DEPARTMENT_OTHER): Payer: Self-pay | Admitting: Physical Therapy

## 2022-09-20 ENCOUNTER — Ambulatory Visit (HOSPITAL_BASED_OUTPATIENT_CLINIC_OR_DEPARTMENT_OTHER): Attending: Family Medicine | Admitting: Physical Therapy

## 2022-09-20 DIAGNOSIS — M436 Torticollis: Secondary | ICD-10-CM | POA: Insufficient documentation

## 2022-09-20 DIAGNOSIS — M2560 Stiffness of unspecified joint, not elsewhere classified: Secondary | ICD-10-CM | POA: Insufficient documentation

## 2022-09-20 DIAGNOSIS — M542 Cervicalgia: Secondary | ICD-10-CM | POA: Insufficient documentation

## 2022-09-20 NOTE — Therapy (Signed)
OUTPATIENT PHYSICAL THERAPY CERVICAL EVALUATION   Patient Name: Shirley Lee MRN: [id number] DOB:Mar 19, 1960, 62 y.o., female Today's Date: 09/20/2022  END OF SESSION:  PT End of Session - 09/20/22 0807     Visit Number 2    Number of Visits 10    Date for PT Re-Evaluation 10/29/22    Authorization Type [employer]    PT Start Time 0805    PT Stop Time 0848    PT Time Calculation (min) 43 min    Activity Tolerance Patient tolerated treatment well    Behavior During Therapy [facility] for tasks assessed/performed             Past Medical History:  Diagnosis Date   Adult ADHD    Chicken pox    History of colon polyps    History of pneumonia 01/17/2022   Lisfranc's sprain, left, subsequent encounter 01/25/2019   Past Surgical History:  Procedure Laterality Date   ABDOMINAL HYSTERECTOMY  1989   Patient Active Problem List   Diagnosis Date Noted   COVID-19 08/26/2022   Neck stiffness 07/30/2022   Elevated alanine aminotransferase (ALT) level 01/17/2022   Hyperlipidemia 01/17/2022   Adhesive capsulitis of left shoulder 05/18/2019   Attention deficit disorder (ADD) in adult 04/29/2016    PCP: de [place], Buren Kos, MD  REFERRING PROVIDER: de [place], Raymond J, MD  REFERRING DIAG: M43.6 (ICD-10-CM) - Neck stiffness  THERAPY DIAG:  Cervicalgia  M25.60 Stiffness of unspecified joint, not elsewhere classified  Rationale for Evaluation and Treatment: Rehabilitation  ONSET DATE: PT order 07/30/2022  SUBJECTIVE:                                                                                                                                                                                                         SUBJECTIVE STATEMENT: Pt denies pain currently.  She has been performing her home exercises and has no pain with exercises.  Pt denies any adverse effects after prior Rx.  She has difficulty turning her head and turns her body when trying to turn her head.     Hand  dominance: Right  PERTINENT HISTORY:  L shoulder adhesive capsulitis  PAIN:  NPRS:  0/10 current and best pain, 7/10 worst Location:  central cervical Pt denies any radicular sx's including N/T down UE.   PRECAUTIONS: None  RED FLAGS: None     WEIGHT BEARING RESTRICTIONS: No  FALLS:  Has patient fallen in last 6 months? 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  Pt performs a combination of sitting  and standing.  Pt has a standing desk and tries to stand more.   PLOF: Independent  PATIENT GOALS: improve ROM, learn exercises to improve ROM.   OBJECTIVE:   DIAGNOSTIC FINDINGS:  PT unable to view x rays.  Pt states she had cervical x rays approx 3 years which showed arthritis.     TODAY'S TREATMENT:                                                                                                                               Pt performed: Seated and supine chin tucks Shoulder rolls 2x10 in standing seated cervical rotation x 10 reps bilat seated UT stretch 2x20 sec bilat   Seated LS stretch 2x20 sec bilat Seated cervical rotation with towel assist x10 bilat Rows with retraction with RTB 2x10  Cervical AROM:   SB:  R: 23, L:  17 deg  Pt received a HEP handout and was educated in correct form and appropriate frequency.  Pt instructed to not stretch into a painful range.   Manual Therapy: STM with TPR to bilat UT seated to improve soft tissue tightness and mobility and reduce pain.    PATIENT EDUCATION:  Education details: dx, objective findings, relevant anatomy, POC, HEP, HEP, exercise form, and rationale of interventions. Person educated: Patient Education method: Explanation, Demonstration, Verbal cues, and Handouts Education comprehension: verbalized understanding, returned demonstration, verbal cues required, and needs further education  HOME EXERCISE PROGRAM: Access Code: [email or web address] URL: [email or web address] Date:  09/17/2022 Prepared by: Aaron Edelman  Exercises - Seated Cervical Rotation AROM  - 3 x daily - 7 x weekly - 2 sets - 10 reps - Seated Upper Trapezius Stretch  - 2 x daily - 7 x weekly - 2-3 reps - 20 seconds hold - Standing Backward Shoulder Rolls  - 3 x daily - 7 x weekly - 2 sets - 10 reps  Updated HEP: - Gentle Levator Scapulae Stretch  - 2 x daily - 7 x weekly - 2-3 reps - 20 seconds hold  ASSESSMENT:  CLINICAL IMPRESSION: PT reviewed HEP and pt performed HEP.  Pt performed exercises well with cuing and instruction in correct form.  Pt continues to have difficulty with correct form with chin tucks though has improved form in supine and with cuing and increased repetition.  PT updated HEP with LS stretch and gave pt a handout.  PT performed STW to bilat UT and pt tolerated STW well.  She responded well to Rx reporting improved stiffness after Rx.  Pt demonstrates a 5 deg improvement in R cervical SB AROM and had no change in L Sb'ing.  Pt should benefit from skilled PT services to improve ROM, stiffness, pain, and function.     OBJECTIVE IMPAIRMENTS: decreased ROM, hypomobility, increased fascial restrictions, increased muscle spasms, impaired flexibility, postural dysfunction, and pain.   ACTIVITY LIMITATIONS:   PARTICIPATION LIMITATIONS: driving  PERSONAL FACTORS: Time since onset of injury/illness/exacerbation are also affecting  patient's functional outcome.   REHAB POTENTIAL: Good  CLINICAL DECISION MAKING: Stable/uncomplicated  EVALUATION COMPLEXITY: Low   GOALS:   SHORT TERM GOALS: Target date: 10/08/2022   Pt will be independent and compliant with HEP for improved ROM, pain, and function.  Baseline:  Goal status: INITIAL  2.  Pt will report at least a 25% improvement in cervical stiffness and mobility.  Baseline:  Goal status: INITIAL   LONG TERM GOALS: Target date: 10/29/2022  Pt's worst pain will be < 3/10 for improved daily mobility.  Baseline:  Goal  status: INITIAL  2.  Pt will report she is able to turn her head without compensatory movement without significant difficulty for improved mobility with driving.  Baseline:  Goal status: INITIAL  3.  Pt will demo improved cervical AROM to at least 30 deg in Sb'ing and 55 - 60 deg in rotation for improved cervical mobility with ADLs/IADLs. Baseline:  Goal status: INITIAL    PLAN:  PT FREQUENCY: 2x/week  PT DURATION: other: 4-6 weeks  PLANNED INTERVENTIONS: Therapeutic exercises, Therapeutic activity, Neuromuscular re-education, Patient/Family education, Self Care, Joint mobilization, Aquatic Therapy, Dry Needling, Electrical stimulation, Spinal mobilization, Cryotherapy, Moist heat, Taping, Traction, Ultrasound, Manual therapy, and Re-evaluation  PLAN FOR NEXT SESSION: Cont with cervical ROM, postural strengthening, flexibility, and soft tissue techniques.     Audie Clear III PT, DPT 09/20/22 4:38 PM

## 2022-09-25 ENCOUNTER — Ambulatory Visit (HOSPITAL_BASED_OUTPATIENT_CLINIC_OR_DEPARTMENT_OTHER): Admitting: Physical Therapy

## 2022-09-25 ENCOUNTER — Encounter (HOSPITAL_BASED_OUTPATIENT_CLINIC_OR_DEPARTMENT_OTHER): Payer: Self-pay | Admitting: Physical Therapy

## 2022-09-25 DIAGNOSIS — M542 Cervicalgia: Secondary | ICD-10-CM

## 2022-09-25 DIAGNOSIS — M436 Torticollis: Secondary | ICD-10-CM | POA: Diagnosis not present

## 2022-09-25 NOTE — Therapy (Signed)
OUTPATIENT PHYSICAL THERAPY CERVICAL EVALUATION   Patient Name: Shirley Lee MRN: [id number] DOB:1960-08-19, 62 y.o., female Today's Date: 09/25/2022  END OF SESSION:  PT End of Session - 09/25/22 0803     Visit Number 3    Number of Visits 10    Date for PT Re-Evaluation 10/29/22    Authorization Type [employer]    PT Start Time 0804    PT Stop Time 0844    PT Time Calculation (min) 40 min    Activity Tolerance Patient tolerated treatment well    Behavior During Therapy [facility] for tasks assessed/performed             Past Medical History:  Diagnosis Date   Adult ADHD    Chicken pox    History of colon polyps    History of pneumonia 01/17/2022   Lisfranc's sprain, left, subsequent encounter 01/25/2019   Past Surgical History:  Procedure Laterality Date   ABDOMINAL HYSTERECTOMY  1989   Patient Active Problem List   Diagnosis Date Noted   COVID-19 08/26/2022   Neck stiffness 07/30/2022   Elevated alanine aminotransferase (ALT) level 01/17/2022   Hyperlipidemia 01/17/2022   Adhesive capsulitis of left shoulder 05/18/2019   Attention deficit disorder (ADD) in adult 04/29/2016    PCP: de [place], Buren Kos, MD  REFERRING PROVIDER: de [place], Raymond J, MD  REFERRING DIAG: M43.6 (ICD-10-CM) - Neck stiffness  THERAPY DIAG:  Cervicalgia  M25.60 Stiffness of unspecified joint, not elsewhere classified  Rationale for Evaluation and Treatment: Rehabilitation  ONSET DATE: PT order 07/30/2022  SUBJECTIVE:                                                                                                                                                                                                         SUBJECTIVE STATEMENT: Pt feels like her neck is getting better. Thinks her mobility is improving. Doing HEP.    Hand dominance: Right  PERTINENT HISTORY:  L shoulder adhesive capsulitis  PAIN:  NPRS:  0/10 current and best pain, 7/10 worst Location:  central  cervical Pt denies any radicular sx's including N/T down UE.   PRECAUTIONS: None  RED FLAGS: None     WEIGHT BEARING RESTRICTIONS: No  FALLS:  Has patient fallen in last 6 months? 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  Pt has a standing desk and tries to stand more.   PLOF: Independent  PATIENT GOALS: improve ROM, learn exercises to improve ROM.   OBJECTIVE:  DIAGNOSTIC FINDINGS:  PT unable to view x rays.  Pt states she had cervical x rays approx 3 years which showed arthritis.   PATIENT SURVEYS:  FOTO 59 with a goal of 65 at visit 11   COGNITION: Overall cognitive status: Within functional limits for tasks assessed   SENSATION: 2+ sensation to LT t/o bilat UE dermatomes.    POSTURE: FHP.    PALPATION: TTP:  none in cervical paraspinals and bilat UT.   Pt does have moderate soft tissue tightness in bilat UT and cervical paraspinals.               CERVICAL ROM:    Active ROM A/PROM (deg) eval 09/25/22  Flexion [facility] WFL  Extension 30 31  Right lateral flexion 18 22  Left lateral flexion 17 18  Right rotation 39 62  Left rotation 38 47   (Blank rows = not tested)   Pt had no pain with cervical AROM   UPPER EXTREMITY ROM:   Active ROM Right eval Left eval  Shoulder flexion 144 148  Shoulder extension      Shoulder abduction 152 158  Shoulder adduction      Shoulder extension      Shoulder internal rotation      Shoulder external rotation      Elbow flexion      Elbow extension      Wrist flexion      Wrist extension      Wrist ulnar deviation      Wrist radial deviation      Wrist pronation      Wrist supination       (Blank rows = not tested)  TODAY'S TREATMENT:                                                                                                                              09/25/22 Manual: R and L UPA Grade II-III C2- C6  Seated UT stretch 3x 20 second holds  bilateral Supine cervical retraction 2 x 10  Seated SNAG rotation 1 x 10 bilateral Seated thoracic extension over chair 10 x 5 second holds    09/20/22 Pt performed: Seated and supine chin tucks Shoulder rolls 2x10 in standing seated cervical rotation x 10 reps bilat seated UT stretch 2x20 sec bilat   Seated LS stretch 2x20 sec bilat Seated cervical rotation with towel assist x10 bilat Rows with retraction with RTB 2x10  Cervical AROM:   SB:  R: 23, L:  17 deg  Pt received a HEP handout and was educated in correct form and appropriate frequency.  Pt instructed to not stretch into a painful range.   Manual Therapy: STM with TPR to bilat UT seated to improve soft tissue tightness and mobility and reduce pain.    PATIENT EDUCATION:  Education details: dx, objective findings, relevant anatomy, POC, HEP, HEP, exercise form, and rationale of interventions. Person educated: Patient Education method: Explanation,  Demonstration, Verbal cues, and Handouts Education comprehension: verbalized understanding, returned demonstration, verbal cues required, and needs further education  HOME EXERCISE PROGRAM: Access Code: [email or web address] URL: [email or web address] Date: 09/17/2022 Prepared by: Aaron Edelman  Exercises - Seated Cervical Rotation AROM  - 3 x daily - 7 x weekly - 2 sets - 10 reps - Seated Upper Trapezius Stretch  - 2 x daily - 7 x weekly - 2-3 reps - 20 seconds hold - Standing Backward Shoulder Rolls  - 3 x daily - 7 x weekly - 2 sets - 10 reps  Updated HEP: - Gentle Levator Scapulae Stretch  - 2 x daily - 7 x weekly - 2-3 reps - 20 seconds hold  ASSESSMENT:  CLINICAL IMPRESSION: Patient with hypomobile cervical spine. Performed manual with slight improvement in ROM following. Continued with cervical mobility and postural strength. Multimodial cueing provided for supine cervical retraction. Patient will continue to benefit from physical therapy in order to improve  function and reduce impairment.   OBJECTIVE IMPAIRMENTS: decreased ROM, hypomobility, increased fascial restrictions, increased muscle spasms, impaired flexibility, postural dysfunction, and pain.   ACTIVITY LIMITATIONS:   PARTICIPATION LIMITATIONS: driving  PERSONAL FACTORS: Time since onset of injury/illness/exacerbation are also affecting patient's functional outcome.   REHAB POTENTIAL: Good  CLINICAL DECISION MAKING: Stable/uncomplicated  EVALUATION COMPLEXITY: Low   GOALS:   SHORT TERM GOALS: Target date: 10/08/2022   Pt will be independent and compliant with HEP for improved ROM, pain, and function.  Baseline:  Goal status: INITIAL  2.  Pt will report at least a 25% improvement in cervical stiffness and mobility.  Baseline:  Goal status: INITIAL   LONG TERM GOALS: Target date: 10/29/2022  Pt's worst pain will be < 3/10 for improved daily mobility.  Baseline:  Goal status: INITIAL  2.  Pt will report she is able to turn her head without compensatory movement without significant difficulty for improved mobility with driving.  Baseline:  Goal status: INITIAL  3.  Pt will demo improved cervical AROM to at least 30 deg in Sb'ing and 55 - 60 deg in rotation for improved cervical mobility with ADLs/IADLs. Baseline:  Goal status: INITIAL    PLAN:  PT FREQUENCY: 2x/week  PT DURATION: other: 4-6 weeks  PLANNED INTERVENTIONS: Therapeutic exercises, Therapeutic activity, Neuromuscular re-education, Patient/Family education, Self Care, Joint mobilization, Aquatic Therapy, Dry Needling, Electrical stimulation, Spinal mobilization, Cryotherapy, Moist heat, Taping, Traction, Ultrasound, Manual therapy, and Re-evaluation  PLAN FOR NEXT SESSION: Cont with cervical ROM, postural strengthening, flexibility, and soft tissue techniques.     Reola Mosher [employer], PT 09/25/2022, 8:45 AM

## 2022-10-01 ENCOUNTER — Telehealth (HOSPITAL_BASED_OUTPATIENT_CLINIC_OR_DEPARTMENT_OTHER): Payer: Self-pay | Admitting: Family Medicine

## 2022-10-01 DIAGNOSIS — F988 Other specified behavioral and emotional disorders with onset usually occurring in childhood and adolescence: Secondary | ICD-10-CM

## 2022-10-01 NOTE — Telephone Encounter (Signed)
Prescription Request  10/01/2022  LOV: 07/30/2022  What is the name of the medication or equipment? Adderall 30mg   Have you contacted your pharmacy to request a refill? No   Which pharmacy would you like this sent to?  [id number] - [place], [place] - [address] [place] [address] Phone: [phone] Fax: [phone]      Patient notified that their request is being sent to the clinical staff for review and that they should receive a response within 2 business days.   Please advise at [facility] [phone]

## 2022-10-02 MED ORDER — AMPHETAMINE-DEXTROAMPHET ER 30 MG PO CP24
30.0000 mg | ORAL_CAPSULE | Freq: Every day | ORAL | 0 refills | Status: DC
Start: 2022-10-02 — End: 2022-10-30

## 2022-10-08 ENCOUNTER — Encounter (HOSPITAL_BASED_OUTPATIENT_CLINIC_OR_DEPARTMENT_OTHER): Payer: Self-pay | Admitting: Physical Therapy

## 2022-10-08 ENCOUNTER — Ambulatory Visit (HOSPITAL_BASED_OUTPATIENT_CLINIC_OR_DEPARTMENT_OTHER): Admitting: Physical Therapy

## 2022-10-08 DIAGNOSIS — M436 Torticollis: Secondary | ICD-10-CM | POA: Diagnosis not present

## 2022-10-08 DIAGNOSIS — M542 Cervicalgia: Secondary | ICD-10-CM

## 2022-10-08 NOTE — Therapy (Signed)
OUTPATIENT PHYSICAL THERAPY CERVICAL EVALUATION   Patient Name: Shirley Lee MRN: [id number] DOB:[birth year], 62 y.o., female Today's Date: 10/08/2022  END OF SESSION:  PT End of Session - 10/08/22 1105     Visit Number 4    Number of Visits 10    Date for PT Re-Evaluation 10/29/22    Authorization Type [employer]    PT Start Time 1105    PT Stop Time 1145    PT Time Calculation (min) 40 min    Activity Tolerance Patient tolerated treatment well    Behavior During Therapy [facility] for tasks assessed/performed             Past Medical History:  Diagnosis Date   Adult ADHD    Chicken pox    History of colon polyps    History of pneumonia 01/17/2022   Lisfranc's sprain, left, subsequent encounter 01/25/2019   Past Surgical History:  Procedure Laterality Date   ABDOMINAL HYSTERECTOMY  1989   Patient Active Problem List   Diagnosis Date Noted   COVID-19 08/26/2022   Neck stiffness 07/30/2022   Elevated alanine aminotransferase (ALT) level 01/17/2022   Hyperlipidemia 01/17/2022   Adhesive capsulitis of left shoulder 05/18/2019   Attention deficit disorder (ADD) in adult 04/29/2016    PCP: de [place], Buren Kos, MD  REFERRING PROVIDER: de [place], Raymond J, MD  REFERRING DIAG: M43.6 (ICD-10-CM) - Neck stiffness  THERAPY DIAG:  Cervicalgia  M25.60 Stiffness of unspecified joint, not elsewhere classified  Rationale for Evaluation and Treatment: Rehabilitation  ONSET DATE: PT order 07/30/2022  SUBJECTIVE:                                                                                                                                                                                                         SUBJECTIVE STATEMENT: Pt states been feeling pretty good. Thinks she over did it on Saturday. HEP going well.    Hand dominance: Right  PERTINENT HISTORY:  L shoulder adhesive capsulitis  PAIN:  NPRS:  0/10 current and best pain, 7/10 worst Location:  central  cervical Pt denies any radicular sx's including N/T down UE.   PRECAUTIONS: None  RED FLAGS: None     WEIGHT BEARING RESTRICTIONS: No  FALLS:  Has patient fallen in last 6 months? 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  Pt has a standing desk and tries to stand more.   PLOF: Independent  PATIENT GOALS: improve ROM, learn exercises to improve ROM.  OBJECTIVE:   DIAGNOSTIC FINDINGS:  PT unable to view x rays.  Pt states she had cervical x rays approx 3 years which showed arthritis.   PATIENT SURVEYS:  FOTO 59 with a goal of 65 at visit 11   COGNITION: Overall cognitive status: Within functional limits for tasks assessed   SENSATION: 2+ sensation to LT t/o bilat UE dermatomes.    POSTURE: FHP.    PALPATION: TTP:  none in cervical paraspinals and bilat UT.   Pt does have moderate soft tissue tightness in bilat UT and cervical paraspinals.               CERVICAL ROM:    Active ROM A/PROM (deg) eval 09/25/22 10/08/22  Flexion WFL [facility] WFL  Extension 30 31 35  Right lateral flexion 18 22 22   Left lateral flexion 17 18 20   Right rotation 39 62 66  Left rotation 38 47 60   (Blank rows = not tested)   Pt had no pain with cervical AROM   UPPER EXTREMITY ROM:   Active ROM Right eval Left eval  Shoulder flexion 144 148  Shoulder extension      Shoulder abduction 152 158  Shoulder adduction      Shoulder extension      Shoulder internal rotation      Shoulder external rotation      Elbow flexion      Elbow extension      Wrist flexion      Wrist extension      Wrist ulnar deviation      Wrist radial deviation      Wrist pronation      Wrist supination       (Blank rows = not tested)  TODAY'S TREATMENT:                                                                                                                              10/08/22 Manual: STM to bilateral UT  pre and post dry  needling for trigger point identification and muscular relaxation. Trigger Point Dry-Needling  Treatment instructions: Expect mild to moderate muscle soreness. S/S of pneumothorax if dry needled over a lung field, and to seek immediate medical attention should they occur. Patient verbalized understanding of these instructions and education.  Patient Consent Given: Yes Education handout provided: Yes Muscles treated: bilateral UT Electrical stimulation performed: No Parameters: N/A Treatment response/outcome: twitch response, decrease in tissue tension   C/sp AROM sidebending, rotation x 20 each  Standing bilateral shoulder ER RTB 2x 15 Standing shoulder horizontal abduction RTB 2 x 15 Standing Shoulder PNF D2 RTB 1 x 10   09/25/22 Manual: R and L UPA Grade II-III C2- C6  Seated UT stretch 3x 20 second holds bilateral Supine cervical retraction 2 x 10  Seated SNAG rotation 1 x 10 bilateral Seated thoracic extension over chair 10 x 5 second holds    09/20/22 Pt performed: Seated and  supine chin tucks Shoulder rolls 2x10 in standing seated cervical rotation x 10 reps bilat seated UT stretch 2x20 sec bilat   Seated LS stretch 2x20 sec bilat Seated cervical rotation with towel assist x10 bilat Rows with retraction with RTB 2x10  Cervical AROM:   SB:  R: 23, L:  17 deg  Pt received a HEP handout and was educated in correct form and appropriate frequency.  Pt instructed to not stretch into a painful range.   Manual Therapy: STM with TPR to bilat UT seated to improve soft tissue tightness and mobility and reduce pain.    PATIENT EDUCATION:  Education details: dx, objective findings, relevant anatomy, POC, HEP, HEP, exercise form, and rationale of interventions. Person educated: Patient Education method: Explanation, Demonstration, Verbal cues, and Handouts Education comprehension: verbalized understanding, returned demonstration, verbal cues required, and needs further  education  HOME EXERCISE PROGRAM: Access Code: [email or web address] URL: [email or web address] Date: 09/17/2022 Prepared by: Aaron Edelman  Exercises - Seated Cervical Rotation AROM  - 3 x daily - 7 x weekly - 2 sets - 10 reps - Seated Upper Trapezius Stretch  - 2 x daily - 7 x weekly - 2-3 reps - 20 seconds hold - Standing Backward Shoulder Rolls  - 3 x daily - 7 x weekly - 2 sets - 10 reps  Updated HEP: - Gentle Levator Scapulae Stretch  - 2 x daily - 7 x weekly - 2-3 reps - 20 seconds hold  10/08/22- Shoulder External Rotation and Scapular Retraction with Resistance  - 1 x daily - 7 x weekly - 2 sets - 15 reps - Standing Shoulder Horizontal Abduction with Resistance  - 1 x daily - 7 x weekly - 2 sets - 15 reps - Standing Shoulder Single Arm PNF D2 Flexion with Resistance  - 1 x daily - 7 x weekly - 2 sets - 10 reps  ASSESSMENT:  CLINICAL IMPRESSION: Patient with improving AROM. Hyperactive and tender UT, completed DN with slight improvement following. Continued with postural strengthening.Patient will continue to benefit from physical therapy in order to improve function and reduce impairment.   OBJECTIVE IMPAIRMENTS: decreased ROM, hypomobility, increased fascial restrictions, increased muscle spasms, impaired flexibility, postural dysfunction, and pain.   ACTIVITY LIMITATIONS:   PARTICIPATION LIMITATIONS: driving  PERSONAL FACTORS: Time since onset of injury/illness/exacerbation are also affecting patient's functional outcome.   REHAB POTENTIAL: Good  CLINICAL DECISION MAKING: Stable/uncomplicated  EVALUATION COMPLEXITY: Low   GOALS:   SHORT TERM GOALS: Target date: 10/08/2022   Pt will be independent and compliant with HEP for improved ROM, pain, and function.  Baseline:  Goal status: INITIAL  2.  Pt will report at least a 25% improvement in cervical stiffness and mobility.  Baseline:  Goal status: INITIAL   LONG TERM GOALS: Target date: 10/29/2022  Pt's  worst pain will be < 3/10 for improved daily mobility.  Baseline:  Goal status: INITIAL  2.  Pt will report she is able to turn her head without compensatory movement without significant difficulty for improved mobility with driving.  Baseline:  Goal status: INITIAL  3.  Pt will demo improved cervical AROM to at least 30 deg in Sb'ing and 55 - 60 deg in rotation for improved cervical mobility with ADLs/IADLs. Baseline:  Goal status: INITIAL    PLAN:  PT FREQUENCY: 2x/week  PT DURATION: other: 4-6 weeks  PLANNED INTERVENTIONS: Therapeutic exercises, Therapeutic activity, Neuromuscular re-education, Patient/Family education, Self Care, Joint mobilization, Aquatic Therapy, Dry Needling, Electrical stimulation, Spinal mobilization,  Cryotherapy, Moist heat, Taping, Traction, Ultrasound, Manual therapy, and Re-evaluation  PLAN FOR NEXT SESSION: Cont with cervical ROM, postural strengthening, flexibility, and soft tissue techniques.     Reola Mosher Frederick Marro, PT 10/08/2022, 11:47 AM

## 2022-10-08 NOTE — Patient Instructions (Signed)

## 2022-10-10 ENCOUNTER — Ambulatory Visit (HOSPITAL_BASED_OUTPATIENT_CLINIC_OR_DEPARTMENT_OTHER): Admitting: Physical Therapy

## 2022-10-10 DIAGNOSIS — M436 Torticollis: Secondary | ICD-10-CM | POA: Diagnosis not present

## 2022-10-10 DIAGNOSIS — M542 Cervicalgia: Secondary | ICD-10-CM

## 2022-10-11 ENCOUNTER — Encounter (HOSPITAL_BASED_OUTPATIENT_CLINIC_OR_DEPARTMENT_OTHER): Payer: Self-pay | Admitting: Physical Therapy

## 2022-10-15 ENCOUNTER — Ambulatory Visit (HOSPITAL_BASED_OUTPATIENT_CLINIC_OR_DEPARTMENT_OTHER): Attending: Family Medicine | Admitting: Physical Therapy

## 2022-10-15 DIAGNOSIS — M542 Cervicalgia: Secondary | ICD-10-CM | POA: Insufficient documentation

## 2022-10-15 NOTE — Therapy (Signed)
OUTPATIENT PHYSICAL THERAPY TREATMENT NOTE   Patient Name: Shirley Lee MRN: [id number] DOB:1960/03/14, 62 y.o., female Today's Date: 10/16/2022  END OF SESSION:  PT End of Session - 10/15/22 0929     Visit Number 6    Number of Visits 10    Date for PT Re-Evaluation 10/29/22    Authorization Type [employer]    PT Start Time 0850    PT Stop Time 0935    PT Time Calculation (min) 45 min    Activity Tolerance Patient tolerated treatment well    Behavior During Therapy [facility] for tasks assessed/performed              Past Medical History:  Diagnosis Date   Adult ADHD    Chicken pox    History of colon polyps    History of pneumonia 01/17/2022   Lisfranc's sprain, left, subsequent encounter 01/25/2019   Past Surgical History:  Procedure Laterality Date   ABDOMINAL HYSTERECTOMY  1989   Patient Active Problem List   Diagnosis Date Noted   COVID-19 08/26/2022   Neck stiffness 07/30/2022   Elevated alanine aminotransferase (ALT) level 01/17/2022   Hyperlipidemia 01/17/2022   Adhesive capsulitis of left shoulder 05/18/2019   Attention deficit disorder (ADD) in adult 04/29/2016    PCP: de [place], Buren Kos, MD  REFERRING PROVIDER: de [place], Raymond J, MD  REFERRING DIAG: M43.6 (ICD-10-CM) - Neck stiffness  THERAPY DIAG:  Cervicalgia  M25.60 Stiffness of unspecified joint, not elsewhere classified  Rationale for Evaluation and Treatment: Rehabilitation  ONSET DATE: PT order 07/30/2022  SUBJECTIVE:                                                                                                                                                                                                         SUBJECTIVE STATEMENT: Pt reports no increased pain after Rx and states she felt good after Rx.  Pt states the dry needling has helped.  She reports compliance with HEP.  Pt denies pain currently.   Hand dominance: Right  PERTINENT HISTORY:  L shoulder adhesive  capsulitis  PAIN:  NPRS:  0/10 current and best pain, 7/10 worst Location:  central cervical Pt denies any radicular sx's including N/T down UE.   PRECAUTIONS: None  RED FLAGS: None     WEIGHT BEARING RESTRICTIONS: No  FALLS:  Has patient fallen in last 6 months? 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  Pt has a standing desk and tries to stand  more.   PLOF: Independent  PATIENT GOALS: improve ROM, learn exercises to improve ROM.   OBJECTIVE:   DIAGNOSTIC FINDINGS:  PT unable to view x rays.  Pt states she had cervical x rays approx 3 years which showed arthritis.    TODAY'S TREATMENT:                                                                                                                               PATIENT SURVEYS:  FOTO:  Initial/current: 59/58 with a goal of 65 at visit 11  Manual Therapy: R and L UPA Grade II-III C3 - C6 in supine  STM with TPR to bilateral UT seated and STM to bilat suboccipitials in supine   Therapeutic Exercise: Seated cervical rotation with towel assist 1x10 bilat Rows with retraction with GTB 2x10 Standing bilateral shoulder ER RTB 2x 15 Standing shoulder bilat hz abd 2x15    PATIENT EDUCATION:  Education details: dx,relevant anatomy, POC, HEP, HEP, exercise form, and rationale of interventions. Person educated: Patient Education method: Explanation, Demonstration, Verbal cues, and Handouts Education comprehension: verbalized understanding, returned demonstration, verbal cues required, and needs further education  HOME EXERCISE PROGRAM: Access Code: [email or web address] URL: [email or web address] Date: 09/17/2022 Prepared by: Aaron Edelman  Exercises - Seated Cervical Rotation AROM  - 3 x daily - 7 x weekly - 2 sets - 10 reps - Seated Upper Trapezius Stretch  - 2 x daily - 7 x weekly - 2-3 reps - 20 seconds hold - Standing Backward Shoulder Rolls   - 3 x daily - 7 x weekly - 2 sets - 10 reps  Updated HEP: - Gentle Levator Scapulae Stretch  - 2 x daily - 7 x weekly - 2-3 reps - 20 seconds hold  10/08/22- Shoulder External Rotation and Scapular Retraction with Resistance  - 1 x daily - 7 x weekly - 2 sets - 15 reps - Standing Shoulder Horizontal Abduction with Resistance  - 1 x daily - 7 x weekly - 2 sets - 15 reps - Standing Shoulder Single Arm PNF D2 Flexion with Resistance  - 1 x daily - 7 x weekly - 2 sets - 10 reps  ASSESSMENT:  CLINICAL IMPRESSION: Pt reports her motion is improving.  PT performed manual techniques to improve stiffness, ROM, pain, and soft tissue mobility.  Pt tolerated MT well.  Pt performed exercises well with cuing and instruction in correct form and positioning.  She responded well to Rx reporting improved stiffness and mobility after Rx.  Patient will continue to benefit from continued skilled PT services to improve ROM, function, and impairments and to address goals.       OBJECTIVE IMPAIRMENTS: decreased ROM, hypomobility, increased fascial restrictions, increased muscle spasms, impaired flexibility, postural dysfunction, and pain.   ACTIVITY LIMITATIONS:   PARTICIPATION LIMITATIONS: driving  PERSONAL FACTORS: Time since onset of injury/illness/exacerbation are also affecting patient's functional outcome.   REHAB POTENTIAL: Good  CLINICAL DECISION MAKING:  Stable/uncomplicated  EVALUATION COMPLEXITY: Low   GOALS:   SHORT TERM GOALS: Target date: 10/08/2022   Pt will be independent and compliant with HEP for improved ROM, pain, and function.  Baseline:  Goal status: INITIAL  2.  Pt will report at least a 25% improvement in cervical stiffness and mobility.  Baseline:  Goal status: INITIAL   LONG TERM GOALS: Target date: 10/29/2022  Pt's worst pain will be < 3/10 for improved daily mobility.  Baseline:  Goal status: INITIAL  2.  Pt will report she is able to turn her head without  compensatory movement without significant difficulty for improved mobility with driving.  Baseline:  Goal status: INITIAL  3.  Pt will demo improved cervical AROM to at least 30 deg in Sb'ing and 55 - 60 deg in rotation for improved cervical mobility with ADLs/IADLs. Baseline:  Goal status: INITIAL    PLAN:  PT FREQUENCY: 2x/week  PT DURATION: other: 4-6 weeks  PLANNED INTERVENTIONS: Therapeutic exercises, Therapeutic activity, Neuromuscular re-education, Patient/Family education, Self Care, Joint mobilization, Aquatic Therapy, Dry Needling, Electrical stimulation, Spinal mobilization, Cryotherapy, Moist heat, Taping, Traction, Ultrasound, Manual therapy, and Re-evaluation  PLAN FOR NEXT SESSION: Cont with cervical ROM, postural strengthening, flexibility, and soft tissue techniques.     Audie Clear III PT, DPT 10/16/22 10:08 AM

## 2022-10-16 ENCOUNTER — Encounter (HOSPITAL_BASED_OUTPATIENT_CLINIC_OR_DEPARTMENT_OTHER): Payer: Self-pay | Admitting: Physical Therapy

## 2022-10-17 ENCOUNTER — Ambulatory Visit (HOSPITAL_BASED_OUTPATIENT_CLINIC_OR_DEPARTMENT_OTHER): Admitting: Physical Therapy

## 2022-10-22 ENCOUNTER — Ambulatory Visit (HOSPITAL_BASED_OUTPATIENT_CLINIC_OR_DEPARTMENT_OTHER): Admitting: Physical Therapy

## 2022-10-22 DIAGNOSIS — M542 Cervicalgia: Secondary | ICD-10-CM

## 2022-10-22 NOTE — Therapy (Signed)
OUTPATIENT PHYSICAL THERAPY TREATMENT NOTE   Patient Name: Shirley Lee MRN: [id number] DOB:Apr 10, 1960, 62 y.o., female Today's Date: 10/23/2022  END OF SESSION:   PT End of Session - 10/22/22       Visit Number 7    Number of Visits 10     Date for PT Re-Evaluation 10/29/22     Authorization Type [employer]     PT Start Time 1158     PT Stop Time 1236     PT Time Calculation (min) 38 min     Activity Tolerance Patient tolerated treatment well     Behavior During Therapy [facility] for tasks assessed/performed          Past Medical History:  Diagnosis Date   Adult ADHD    Chicken pox    History of colon polyps    History of pneumonia 01/17/2022   Lisfranc's sprain, left, subsequent encounter 01/25/2019   Past Surgical History:  Procedure Laterality Date   ABDOMINAL HYSTERECTOMY  1989   Patient Active Problem List   Diagnosis Date Noted   COVID-19 08/26/2022   Neck stiffness 07/30/2022   Elevated alanine aminotransferase (ALT) level 01/17/2022   Hyperlipidemia 01/17/2022   Adhesive capsulitis of left shoulder 05/18/2019   Attention deficit disorder (ADD) in adult 04/29/2016    PCP: [place], Buren Kos, MD  REFERRING PROVIDER: de [place], Raymond J, MD  REFERRING DIAG: M43.6 (ICD-10-CM) - Neck stiffness  THERAPY DIAG:  Cervicalgia  M25.60 Stiffness of unspecified joint, not elsewhere classified  Rationale for Evaluation and Treatment: Rehabilitation  ONSET DATE: PT order 07/30/2022  SUBJECTIVE:                                                                                                                                                                                                         SUBJECTIVE STATEMENT: Pt denies any adverse effects after prior Rx.  Pt reports improved neck mobility.  Pt reports she is able to turn her head better.  She reports compliance with HEP.  Pt denies pain currently.  Pt is not having the pain at the end of the day she was  having.   Hand dominance: Right  PERTINENT HISTORY:  L shoulder adhesive capsulitis  PAIN:  NPRS:  0/10 current and best pain, 3-4/10 worst Location:  central cervical Pt denies any radicular sx's including N/T down UE.   PRECAUTIONS: None  RED FLAGS: None     WEIGHT BEARING RESTRICTIONS: No  FALLS:  Has patient fallen in last 6 months? 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  Pt performs a combination of sitting and standing.  Pt has a standing desk and tries to stand more.   PLOF: Independent  PATIENT GOALS: improve ROM, learn exercises to improve ROM.   OBJECTIVE:   DIAGNOSTIC FINDINGS:  PT unable to view x rays.  Pt states she had cervical x rays approx 3 years which showed arthritis.    TODAY'S TREATMENT:                                                                                                                               PATIENT SURVEYS:   Manual Therapy: STM to bilat cervical paraspinals and suboccipitals in supine.  Suboccipital release in supine Static cupping to bilat UT seated  Therapeutic Exercise: Rows with retraction with GTB 2x10 Standing shoulder extension with retraction with GTB 2x10 Standing bilateral shoulder ER RTB 2x 15 Seated UT stretch 2x20 sec bilat   PATIENT EDUCATION:  Education details: dx, relevant anatomy, POC, HEP, HEP, exercise form, and rationale of interventions. Person educated: Patient Education method: Explanation, Demonstration, Verbal cues, and Handouts Education comprehension: verbalized understanding, returned demonstration, verbal cues required, and needs further education  HOME EXERCISE PROGRAM: Access Code: [email or web address] URL: [email or web address] Date: 09/17/2022 Prepared by: Aaron Edelman  Exercises - Seated Cervical Rotation AROM  - 3 x daily - 7 x weekly - 2 sets - 10 reps - Seated Upper Trapezius Stretch  - 2 x daily - 7 x weekly - 2-3 reps - 20 seconds hold - Standing  Backward Shoulder Rolls  - 3 x daily - 7 x weekly - 2 sets - 10 reps  Updated HEP: - Gentle Levator Scapulae Stretch  - 2 x daily - 7 x weekly - 2-3 reps - 20 seconds hold  10/08/22- Shoulder External Rotation and Scapular Retraction with Resistance  - 1 x daily - 7 x weekly - 2 sets - 15 reps - Standing Shoulder Horizontal Abduction with Resistance  - 1 x daily - 7 x weekly - 2 sets - 15 reps - Standing Shoulder Single Arm PNF D2 Flexion with Resistance  - 1 x daily - 7 x weekly - 2 sets - 10 reps  ASSESSMENT:  CLINICAL IMPRESSION: Pt is progressing well with pain and sx's.  PT performed cupping to improve soft tissue tightness and mobility and to reduce myofascial adhesions and restrictions.  Pt tolerated cupping well and had an appropriate response.  She states her neck felt better after cupping. Pt performed exercises well with cuing and instruction in correct form and positioning.  She responded well to Rx reporting improved mobility after Rx.  Patient should continue to benefit from continued skilled PT services to address ongoing goals and to improve ROM, tightness, strength, and function.     OBJECTIVE IMPAIRMENTS: decreased ROM, hypomobility, increased fascial restrictions, increased muscle spasms, impaired flexibility, postural dysfunction, and pain.   ACTIVITY LIMITATIONS:   PARTICIPATION LIMITATIONS: driving  PERSONAL FACTORS: Time since onset of injury/illness/exacerbation are also affecting patient's functional outcome.   REHAB POTENTIAL: Good  CLINICAL DECISION MAKING: Stable/uncomplicated  EVALUATION COMPLEXITY: Low   GOALS:   SHORT TERM GOALS: Target date: 10/08/2022   Pt will be independent and compliant with HEP for improved ROM, pain, and function.  Baseline:  Goal status: INITIAL  2.  Pt will report at least a 25% improvement in cervical stiffness and mobility.  Baseline:  Goal status: INITIAL   LONG TERM GOALS: Target date: 10/29/2022  Pt's worst  pain will be < 3/10 for improved daily mobility.  Baseline:  Goal status: INITIAL  2.  Pt will report she is able to turn her head without compensatory movement without significant difficulty for improved mobility with driving.  Baseline:  Goal status: INITIAL  3.  Pt will demo improved cervical AROM to at least 30 deg in Sb'ing and 55 - 60 deg in rotation for improved cervical mobility with ADLs/IADLs. Baseline:  Goal status: INITIAL    PLAN:  PT FREQUENCY: 2x/week  PT DURATION: other: 4-6 weeks  PLANNED INTERVENTIONS: Therapeutic exercises, Therapeutic activity, Neuromuscular re-education, Patient/Family education, Self Care, Joint mobilization, Aquatic Therapy, Dry Needling, Electrical stimulation, Spinal mobilization, Cryotherapy, Moist heat, Taping, Traction, Ultrasound, Manual therapy, and Re-evaluation  PLAN FOR NEXT SESSION: Cont with cervical ROM, postural strengthening, flexibility, and soft tissue techniques.     Audie Clear III PT, DPT 10/23/22 10:18 PM

## 2022-10-23 ENCOUNTER — Encounter (HOSPITAL_BASED_OUTPATIENT_CLINIC_OR_DEPARTMENT_OTHER): Payer: Self-pay | Admitting: Physical Therapy

## 2022-10-24 ENCOUNTER — Ambulatory Visit (HOSPITAL_BASED_OUTPATIENT_CLINIC_OR_DEPARTMENT_OTHER): Admitting: Physical Therapy

## 2022-10-25 ENCOUNTER — Ambulatory Visit (HOSPITAL_BASED_OUTPATIENT_CLINIC_OR_DEPARTMENT_OTHER): Admitting: Physical Therapy

## 2022-10-25 DIAGNOSIS — M542 Cervicalgia: Secondary | ICD-10-CM

## 2022-10-25 NOTE — Therapy (Signed)
OUTPATIENT PHYSICAL THERAPY TREATMENT NOTE   Patient Name: Shirley Lee MRN: [id number] DOB:1960-06-30, 62 y.o., female Today's Date: 10/26/2022  END OF SESSION:  PT End of Session - 10/25/22 1027     Visit Number 8    Number of Visits 10    Date for PT Re-Evaluation 10/29/22    Authorization Type [employer]    PT Start Time 1022    PT Stop Time 1058    PT Time Calculation (min) 36 min    Activity Tolerance Patient tolerated treatment well    Behavior During Therapy [facility] for tasks assessed/performed               Past Medical History:  Diagnosis Date   Adult ADHD    Chicken pox    History of colon polyps    History of pneumonia 01/17/2022   Lisfranc's sprain, left, subsequent encounter 01/25/2019   Past Surgical History:  Procedure Laterality Date   ABDOMINAL HYSTERECTOMY  1989   Patient Active Problem List   Diagnosis Date Noted   COVID-19 08/26/2022   Neck stiffness 07/30/2022   Elevated alanine aminotransferase (ALT) level 01/17/2022   Hyperlipidemia 01/17/2022   Adhesive capsulitis of left shoulder 05/18/2019   Attention deficit disorder (ADD) in adult 04/29/2016    PCP: [place], Buren Kos, MD  REFERRING PROVIDER: de [place], Raymond J, MD  REFERRING DIAG: M43.6 (ICD-10-CM) - Neck stiffness  THERAPY DIAG:  Cervicalgia  M25.60 Stiffness of unspecified joint, not elsewhere classified  Rationale for Evaluation and Treatment: Rehabilitation  ONSET DATE: PT order 07/30/2022  SUBJECTIVE:                                                                                                                                                                                                         SUBJECTIVE STATEMENT: Pt states she felt good after cupping last Rx and wants to do cupping again.  Pt states her shoulders felt better after the cupping.  Pt is not having the pain at the end of the day she was having.   Hand dominance: Right  PERTINENT HISTORY:  L  shoulder adhesive capsulitis  PAIN:  NPRS:  1/10 current "if that" and best pain, 3-4/10 worst Location:  central cervical Pt denies any radicular sx's including N/T down UE.   PRECAUTIONS: None  RED FLAGS: None     WEIGHT BEARING RESTRICTIONS: No  FALLS:  Has patient fallen in last 6 months? 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  Pt has a standing desk and tries to stand more.   PLOF: Independent  PATIENT GOALS: improve ROM, learn exercises to improve ROM.   OBJECTIVE:   DIAGNOSTIC FINDINGS:  PT unable to view x rays.  Pt states she had cervical x rays approx 3 years which showed arthritis.    TODAY'S TREATMENT:                                                                                                                               PATIENT SURVEYS:   Manual Therapy: Static and dynamic cupping to bilat UT and cervical paraspinals seated  Therapeutic Exercise: Rows with retraction with GTB 2x10 Standing shoulder extension with retraction with GTB 2x10 Standing bilateral shoulder ER RTB 2x 15 Standing shoulder hz abd 2x10 with RTB Seated UT stretch 3x20 sec bilat Seated LS stretch 2x20 sec bilat   PATIENT EDUCATION:  Education details: dx, relevant anatomy, POC, HEP, HEP, exercise form, and rationale of interventions. Person educated: Patient Education method: Explanation, Demonstration, Verbal cues, and Handouts Education comprehension: verbalized understanding, returned demonstration, verbal cues required, and needs further education  HOME EXERCISE PROGRAM: Access Code: [email or web address] URL: [email or web address] Date: 09/17/2022 Prepared by: Aaron Edelman  Exercises - Seated Cervical Rotation AROM  - 3 x daily - 7 x weekly - 2 sets - 10 reps - Seated Upper Trapezius Stretch  - 2 x daily - 7 x weekly - 2-3 reps - 20 seconds hold - Standing Backward Shoulder Rolls  - 3 x  daily - 7 x weekly - 2 sets - 10 reps  Updated HEP: - Gentle Levator Scapulae Stretch  - 2 x daily - 7 x weekly - 2-3 reps - 20 seconds hold  10/08/22- Shoulder External Rotation and Scapular Retraction with Resistance  - 1 x daily - 7 x weekly - 2 sets - 15 reps - Standing Shoulder Horizontal Abduction with Resistance  - 1 x daily - 7 x weekly - 2 sets - 15 reps - Standing Shoulder Single Arm PNF D2 Flexion with Resistance  - 1 x daily - 7 x weekly - 2 sets - 10 reps  ASSESSMENT:  CLINICAL IMPRESSION: Pt presents to Rx reporting a good response to cupping last Rx.  PT performed static and dynamic cupping to bilat UT and cervical paraspinals to improve soft tissue tightness and mobility and to reduce myofascial adhesions and restrictions.  Pt had an appropriate response to cupping having multiple petichiae on bilat sides.  PT educated pt in response and showed pt response in mirror.  PT instructed pt to drink water after Rx.  She states her neck felt better after cupping. Pt performed exercises well with cuing and instruction in correct form and positioning including cervical stretches.  She responded well to Rx having no c/o's after Rx.  Patient should continue to benefit from continued skilled PT services to address ongoing goals and to improve ROM, tightness, strength, and  function.     OBJECTIVE IMPAIRMENTS: decreased ROM, hypomobility, increased fascial restrictions, increased muscle spasms, impaired flexibility, postural dysfunction, and pain.   ACTIVITY LIMITATIONS:   PARTICIPATION LIMITATIONS: driving  PERSONAL FACTORS: Time since onset of injury/illness/exacerbation are also affecting patient's functional outcome.   REHAB POTENTIAL: Good  CLINICAL DECISION MAKING: Stable/uncomplicated  EVALUATION COMPLEXITY: Low   GOALS:   SHORT TERM GOALS: Target date: 10/08/2022   Pt will be independent and compliant with HEP for improved ROM, pain, and function.  Baseline:  Goal  status: INITIAL  2.  Pt will report at least a 25% improvement in cervical stiffness and mobility.  Baseline:  Goal status: INITIAL   LONG TERM GOALS: Target date: 10/29/2022  Pt's worst pain will be < 3/10 for improved daily mobility.  Baseline:  Goal status: INITIAL  2.  Pt will report she is able to turn her head without compensatory movement without significant difficulty for improved mobility with driving.  Baseline:  Goal status: INITIAL  3.  Pt will demo improved cervical AROM to at least 30 deg in Sb'ing and 55 - 60 deg in rotation for improved cervical mobility with ADLs/IADLs. Baseline:  Goal status: INITIAL    PLAN:  PT FREQUENCY: 2x/week  PT DURATION: other: 4-6 weeks  PLANNED INTERVENTIONS: Therapeutic exercises, Therapeutic activity, Neuromuscular re-education, Patient/Family education, Self Care, Joint mobilization, Aquatic Therapy, Dry Needling, Electrical stimulation, Spinal mobilization, Cryotherapy, Moist heat, Taping, Traction, Ultrasound, Manual therapy, and Re-evaluation  PLAN FOR NEXT SESSION: Cont with cervical ROM, postural strengthening, flexibility, and soft tissue techniques.     Audie Clear III PT, DPT 10/26/22 7:39 AM

## 2022-10-26 ENCOUNTER — Encounter (HOSPITAL_BASED_OUTPATIENT_CLINIC_OR_DEPARTMENT_OTHER): Payer: Self-pay | Admitting: Physical Therapy

## 2022-10-30 ENCOUNTER — Ambulatory Visit (INDEPENDENT_AMBULATORY_CARE_PROVIDER_SITE_OTHER): Admitting: Family Medicine

## 2022-10-30 ENCOUNTER — Encounter (HOSPITAL_BASED_OUTPATIENT_CLINIC_OR_DEPARTMENT_OTHER): Payer: Self-pay | Admitting: Family Medicine

## 2022-10-30 VITALS — BP 132/84 | HR 73 | Ht 64.5 in | Wt 147.2 lb

## 2022-10-30 DIAGNOSIS — Z0000 Encounter for general adult medical examination without abnormal findings: Secondary | ICD-10-CM

## 2022-10-30 DIAGNOSIS — Z1231 Encounter for screening mammogram for malignant neoplasm of breast: Secondary | ICD-10-CM

## 2022-10-30 DIAGNOSIS — M436 Torticollis: Secondary | ICD-10-CM

## 2022-10-30 DIAGNOSIS — F988 Other specified behavioral and emotional disorders with onset usually occurring in childhood and adolescence: Secondary | ICD-10-CM | POA: Diagnosis not present

## 2022-10-30 DIAGNOSIS — J45909 Unspecified asthma, uncomplicated: Secondary | ICD-10-CM | POA: Insufficient documentation

## 2022-10-30 DIAGNOSIS — Z1211 Encounter for screening for malignant neoplasm of colon: Secondary | ICD-10-CM

## 2022-10-30 DIAGNOSIS — Z860100 Personal history of colon polyps, unspecified: Secondary | ICD-10-CM | POA: Insufficient documentation

## 2022-10-30 LAB — CBC WITH DIFFERENTIAL/PLATELET
Basophils Absolute: 0.1 10*3/uL (ref 0.0–0.2)
Basos: 1 %
EOS (ABSOLUTE): 0.2 10*3/uL (ref 0.0–0.4)
Eos: 3 %
Hematocrit: 43.5 % (ref 34.0–46.6)
Hemoglobin: 14.9 g/dL (ref 11.1–15.9)
Immature Grans (Abs): 0 10*3/uL (ref 0.0–0.1)
Immature Granulocytes: 0 %
Lymphocytes Absolute: 2.2 10*3/uL (ref 0.7–3.1)
Lymphs: 30 %
MCH: 31.2 pg (ref 26.6–33.0)
MCHC: 34.3 g/dL (ref 31.5–35.7)
MCV: 91 fL (ref 79–97)
Monocytes Absolute: 0.6 10*3/uL (ref 0.1–0.9)
Monocytes: 8 %
Neutrophils Absolute: 4.3 10*3/uL (ref 1.4–7.0)
Neutrophils: 58 %
Platelets: 309 10*3/uL (ref 150–450)
RBC: 4.77 x10E6/uL (ref 3.77–5.28)
RDW: 12.9 % (ref 11.7–15.4)
WBC: 7.5 10*3/uL (ref 3.4–10.8)

## 2022-10-30 LAB — COMPREHENSIVE METABOLIC PANEL
ALT: 23 [IU]/L (ref 0–32)
AST: 24 [IU]/L (ref 0–40)
Albumin: 4.5 g/dL (ref 3.9–4.9)
Alkaline Phosphatase: 84 [IU]/L (ref 44–121)
BUN/Creatinine Ratio: 16 (ref 12–28)
BUN: 13 mg/dL (ref 8–27)
Bilirubin Total: 0.5 mg/dL (ref 0.0–1.2)
CO2: 23 mmol/L (ref 20–29)
Calcium: 9.6 mg/dL (ref 8.7–10.3)
Chloride: 101 mmol/L (ref 96–106)
Creatinine, Ser: 0.82 mg/dL (ref 0.57–1.00)
Globulin, Total: 2.3 g/dL (ref 1.5–4.5)
Glucose: 97 mg/dL (ref 70–99)
Potassium: 4.6 mmol/L (ref 3.5–5.2)
Sodium: 137 mmol/L (ref 134–144)
Total Protein: 6.8 g/dL (ref 6.0–8.5)
eGFR: 81 mL/min/{1.73_m2} (ref >59)

## 2022-10-30 LAB — LIPID PANEL
Chol/HDL Ratio: 3.4 {ratio} (ref 0.0–4.4)
Cholesterol, Total: 201 mg/dL — ABNORMAL HIGH (ref 100–199)
HDL: 59 mg/dL (ref >39)
LDL Chol Calc (NIH): 129 mg/dL — ABNORMAL HIGH (ref 0–99)
Triglycerides: 74 mg/dL (ref 0–149)
VLDL Cholesterol Cal: 13 mg/dL (ref 5–40)

## 2022-10-30 LAB — TSH RFX ON ABNORMAL TO FREE T4: TSH: 0.941 u[IU]/mL (ref 0.450–4.500)

## 2022-10-30 LAB — HEMOGLOBIN A1C
Est. average glucose Bld gHb Est-mCnc: 108 mg/dL
Hgb A1c MFr Bld: 5.4 % (ref 4.8–5.6)

## 2022-10-30 MED ORDER — AMPHETAMINE-DEXTROAMPHET ER 30 MG PO CP24
30.0000 mg | ORAL_CAPSULE | Freq: Every day | ORAL | 0 refills | Status: DC
Start: 2022-10-30 — End: 2022-12-16

## 2022-10-30 MED ORDER — CYCLOBENZAPRINE HCL 5 MG PO TABS: 5.0000 mg | ORAL_TABLET | Freq: Three times a day (TID) | ORAL | 1 refills | Status: DC | PRN

## 2022-10-30 MED ORDER — MELOXICAM 7.5 MG PO TABS: 7.5000 mg | ORAL_TABLET | Freq: Every day | ORAL | 0 refills | Status: DC

## 2022-10-30 NOTE — Patient Instructions (Signed)
  Medication Instructions:  Your physician recommends that you continue on your current medications as directed. Please refer to the Current Medication list given to you today. --If you need a refill on any your medications before your next appointment, please call your pharmacy first. If no refills are authorized on file call the office.-- Lab Work: Your physician has recommended that you have lab work today: yes If you have labs (blood work) drawn today and your tests are completely normal, you will receive your results via MyChart message OR a phone call from our staff.  Please ensure you check your voicemail in the event that you authorized detailed messages to be left on a delegated number. If you have any lab test that is abnormal or we need to change your treatment, we will call you to review the results.  Referrals/Procedures/Imaging: no  Follow-Up: Your next appointment:   Your physician recommends that you schedule a follow-up appointment in: 1-2 months with Dr. de [place]  You will receive a text message or e-mail with a link to a survey about your care and experience with [place] today! We would greatly appreciate your feedback!   Thanks for letting us be apart of your health journey!!  Primary Care and Sports Medicine   Dr. Ceasar Mons [place]   We encourage you to activate your patient portal called "MyChart".  Sign up information is provided on this After Visit Summary.  MyChart is used to connect with patients for Virtual Visits (Telemedicine).  Patients are able to view lab/test results, encounter notes, upcoming appointments, etc.  Non-urgent messages can be sent to your provider as well. To learn more about what you can do with MyChart, please visit --  [email or web address].

## 2022-10-30 NOTE — Assessment & Plan Note (Signed)
Patient reports that she is doing well currently.  Feels the current dose of medication is adequately controlling symptoms.  Denies any issues with sleep, appetite, chest pain or palpitations.  PDMP reviewed, no red flags, refill sent to pharmacy

## 2022-10-30 NOTE — Progress Notes (Signed)
    Procedures performed today:    None.  Independent interpretation of notes and tests performed by another provider:   None.  Brief History, Exam, Impression, and Recommendations:    BP 132/84 (BP Location: Right Arm, Patient Position: Sitting, Cuff Size: Normal)   Pulse 73   Ht 5' 4.5" (1.638 m)   Wt 147 lb 3.2 oz (66.8 kg)   LMP  (LMP Unknown)   SpO2 98%   BMI 24.88 kg/m   Attention deficit disorder (ADD) in adult Assessment & Plan: Patient reports that she is doing well currently.  Feels the current dose of medication is adequately controlling symptoms.  Denies any issues with sleep, appetite, chest pain or palpitations.  PDMP reviewed, no red flags, refill sent to pharmacy  Orders: -     Amphetamine-Dextroamphet ER; Take 1 capsule (30 mg total) by mouth daily.  Dispense: 30 capsule; Refill: 0  History of colon polyps  Special screening for malignant neoplasms, colon -     Ambulatory referral to Gastroenterology  Screening mammogram for breast cancer -     3D Screening Mammogram, Left and Right; Future  Wellness examination -     CBC with Differential/Platelet -     Comprehensive metabolic panel -     Hemoglobin A1c -     Lipid panel -     TSH Rfx on Abnormal to Free T4  Neck stiffness Assessment & Plan: Has had improvement with working with physical therapy.  She does not have any further sessions scheduled currently, however does plan to look further into arranging for additional treatments.  She does feel that symptoms are about 40% improved. Given progress thus far with physical therapy, I do feel that she would benefit from additional treatment with PT.  She will look to arrange for more visits with PT who may need to authorize additional appointments through insurance   Other orders -     Meloxicam; Take 1 tablet (7.5 mg total) by mouth daily.  Dispense: 30 tablet; Refill: 0 -     Cyclobenzaprine HCl; Take 1 tablet (5 mg total) by mouth 3 (three) times  daily as needed for muscle spasms.  Dispense: 60 tablet; Refill: 1  Patient reports concerns related to weight.  She feels that she has about 10 pounds above where she should be.  She has been working on lifestyle modifications related to diet and exercise.  She has had some limitations in exercise due to ongoing neck pain as above.  Currently doing notable been walking.  Previously was engaging more in rowing. Reviewed patient weight, current BMI is 24.88.  Discussed that this BMI is within normal range.  Given this, options from a medical standpoint are limited as there would not be indication for pharmacotherapy.  We did discuss further related to lifestyle modifications including diet as well as various forms of exercise.  Would recommend gradually working back towards regular incorporation of moderate intensity aerobic exercise as much as neck symptoms will allow.  Return in about 1 month (around 11/30/2022) for CPE.  Patient will complete labs today for upcoming physical   ___________________________________________ Nathaniel Yaden de [place], MD, ABFM, [facility] Sports Medicine [facility]

## 2022-10-30 NOTE — Assessment & Plan Note (Signed)
Has had improvement with working with physical therapy.  She does not have any further sessions scheduled currently, however does plan to look further into arranging for additional treatments.  She does feel that symptoms are about 40% improved. Given progress thus far with physical therapy, I do feel that she would benefit from additional treatment with PT.  She will look to arrange for more visits with PT who may need to authorize additional appointments through insurance

## 2022-10-31 ENCOUNTER — Ambulatory Visit (HOSPITAL_BASED_OUTPATIENT_CLINIC_OR_DEPARTMENT_OTHER): Admitting: Physical Therapy

## 2022-10-31 DIAGNOSIS — M542 Cervicalgia: Secondary | ICD-10-CM

## 2022-10-31 NOTE — Therapy (Signed)
OUTPATIENT PHYSICAL THERAPY TREATMENT NOTE   Patient Name: Shirley Lee MRN: [id number] DOB:[birth year], 62 y.o., female Today's Date: 11/01/2022  END OF SESSION:  PT End of Session - 10/31/22 0936     Visit Number 9    Number of Visits 19    Date for PT Re-Evaluation 12/26/22    Authorization Type [employer]    PT Start Time [id number]    PT Stop Time 1013    PT Time Calculation (min) 35 min    Activity Tolerance Patient tolerated treatment well    Behavior During Therapy [facility] for tasks assessed/performed               Past Medical History:  Diagnosis Date   Adult ADHD    Chicken pox    History of colon polyps    History of pneumonia 01/17/2022   Lisfranc's sprain, left, subsequent encounter 01/25/2019   Past Surgical History:  Procedure Laterality Date   ABDOMINAL HYSTERECTOMY  1989   Patient Active Problem List   Diagnosis Date Noted   Reactive airway disease 10/30/2022   History of colon polyps    COVID-19 08/26/2022   Neck stiffness 07/30/2022   Elevated alanine aminotransferase (ALT) level 01/17/2022   Hyperlipidemia 01/17/2022   Adhesive capsulitis of left shoulder 05/18/2019   Attention deficit disorder (ADD) in adult 04/29/2016    PCP: de Peru, Buren Kos, MD  REFERRING PROVIDER: de Peru, Raymond J, MD  REFERRING DIAG: M43.6 (ICD-10-CM) - Neck stiffness  THERAPY DIAG:  Cervicalgia  M25.60 Stiffness of unspecified joint, not elsewhere classified  Rationale for Evaluation and Treatment: Rehabilitation  ONSET DATE: PT order 07/30/2022  SUBJECTIVE:                                                                                                                                                                                                         SUBJECTIVE STATEMENT: Pt denies any adverse effects after prior Rx.  Pt states the exercises and treatment have helped.  She reports improved neck mobility.  Pt has not had pain at the pain at the end of  the day she was having.  "My neck doesn't hurt like it has been."   Pt continues to have stiffness though has improved.  Pt is turning her neck when looking over her shoulder while driving instead of turning her body.  Pt is turning her head to talk to people instead of turning body.  Pt reports 65-70% improvement in stiffness and mobility.   Hand dominance: Right  PERTINENT HISTORY:  L shoulder  adhesive capsulitis  PAIN:  NPRS:  0/10 current, 1/10 worst Location:  central cervical Pt denies any radicular sx's including N/T down UE.   PRECAUTIONS: None  RED FLAGS: None     WEIGHT BEARING RESTRICTIONS: No  FALLS:  Has patient fallen in last 6 months? 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  Pt has a standing desk and tries to stand more.   PLOF: Independent  PATIENT GOALS: improve ROM, learn exercises to improve ROM.   OBJECTIVE:   DIAGNOSTIC FINDINGS:  PT unable to view x rays.  Pt states she had cervical x rays approx 3 years which showed arthritis.    TODAY'S TREATMENT:                                                                                                                                PALPATION: TTP:  none in cervical paraspinals and bilat UT.   Pt does have moderate soft tissue tightness in bilat UT and cervical paraspinals.               CERVICAL ROM:    Active ROM A/PROM (deg) eval AROM 10/17  Flexion [facility]  Extension 30 44  Right lateral flexion 18 33  Left lateral flexion 17 23  Right rotation 39 48  Left rotation 38 40   (Blank rows = not tested)   Pt had no pain with cervical AROM   UPPER EXTREMITY ROM:   Active ROM Right eval Left eval Right 10/17 Left 10/17  Shoulder flexion 144 148 153 157  Shoulder extension        Shoulder abduction 152 158 152 158  Shoulder adduction        Shoulder extension        Shoulder internal rotation        Shoulder  external rotation        Elbow flexion        Elbow extension        Wrist flexion        Wrist extension        Wrist ulnar deviation        Wrist radial deviation        Wrist pronation        Wrist supination         (Blank rows = not tested)    FOTO:  Initial/Prior/Current:  59/58/71.  Goal of 65.  Manual Therapy: Static and dynamic cupping to bilat UT and cervical paraspinals seated   PATIENT EDUCATION:  Education details: dx, relevant anatomy, POC, HEP, HEP, exercise form, and rationale of interventions. Person educated: Patient Education method: Explanation, Demonstration, Verbal cues, and Handouts Education comprehension: verbalized understanding, returned demonstration, verbal cues required, and needs further education  HOME EXERCISE PROGRAM: Access Code: [email or web address] URL: [email or web address] Date: 09/17/2022 Prepared by: Aaron Edelman  Exercises - Seated Cervical  Rotation AROM  - 3 x daily - 7 x weekly - 2 sets - 10 reps - Seated Upper Trapezius Stretch  - 2 x daily - 7 x weekly - 2-3 reps - 20 seconds hold - Standing Backward Shoulder Rolls  - 3 x daily - 7 x weekly - 2 sets - 10 reps  Updated HEP: - Gentle Levator Scapulae Stretch  - 2 x daily - 7 x weekly - 2-3 reps - 20 seconds hold  10/08/22- Shoulder External Rotation and Scapular Retraction with Resistance  - 1 x daily - 7 x weekly - 2 sets - 15 reps - Standing Shoulder Horizontal Abduction with Resistance  - 1 x daily - 7 x weekly - 2 sets - 15 reps - Standing Shoulder Single Arm PNF D2 Flexion with Resistance  - 1 x daily - 7 x weekly - 2 sets - 10 reps  ASSESSMENT:  CLINICAL IMPRESSION: Pt is progressing well with pain, sx's, and function.  She is not having the pain at the pain at the end of the day she was having.  Pt is turning her neck when looking over her shoulder while driving instead of turning her body.  She still has some compensation with turning her body to look over her shoulder  though is improving.  She continues to have stiffness and limitations with cervical ROM though is improving in all directions.  She demonstrates improved shoulder flexion AROM bilat.  Pt responds well to cupping reporting improved tightness and sx's.  She had multiple petichiae with dynamic cupping today to bilat UT.  PT educated pt in response and showed pt response in mirror.  PT instructed pt to drink water after Rx.  Pt demonstrates improved self perceived disability with FOTO score improving from 58 prior to 71 currently.  She met her FOTO goal.  Pt has met all STG's and LTG #1.  She is progressing toward other goals.  Patient should continue to benefit from continued skilled PT services to address ongoing goals and to improve ROM, tightness, strength, and function.    OBJECTIVE IMPAIRMENTS: decreased ROM, hypomobility, increased fascial restrictions, increased muscle spasms, impaired flexibility, postural dysfunction, and pain.   ACTIVITY LIMITATIONS:   PARTICIPATION LIMITATIONS: driving  PERSONAL FACTORS: Time since onset of injury/illness/exacerbation are also affecting patient's functional outcome.   REHAB POTENTIAL: Good  CLINICAL DECISION MAKING: Stable/uncomplicated  EVALUATION COMPLEXITY: Low   GOALS:   SHORT TERM GOALS: Target date: 10/08/2022   Pt will be independent and compliant with HEP for improved ROM, pain, and function.  Baseline:  Goal status:  GOAL MET 10/31/2022  2.  Pt will report at least a 25% improvement in cervical stiffness and mobility.  Baseline:  Goal status: GOAL MET 10/31/2022   LONG TERM GOALS: Target date: 12/26/2022  Pt's worst pain will be < 3/10 for improved daily mobility.  Baseline:  Goal status: GOAL MET 10/31/2022  2.  Pt will report she is able to turn her head without compensatory movement without significant difficulty for improved mobility with driving.  Baseline:  Goal status: PROGRESSING  3.  Pt will demo improved cervical  AROM to at least 30 deg in Sb'ing and 55 - 60 deg in rotation for improved cervical mobility with ADLs/IADLs. Baseline:  Goal status: PROGRESSING    PLAN:  PT FREQUENCY: 2x/week  PT DURATION: other: 8 weeks  (pt will be out of town during some of this time)  PLANNED INTERVENTIONS: Therapeutic exercises, Therapeutic activity, Neuromuscular re-education,  Patient/Family education, Self Care, Joint mobilization, Aquatic Therapy, Dry Needling, Electrical stimulation, Spinal mobilization, Cryotherapy, Moist heat, Taping, Traction, Ultrasound, Manual therapy, and Re-evaluation  PLAN FOR NEXT SESSION: Cont with cervical ROM, postural strengthening, flexibility, and soft tissue techniques.     Audie Clear III PT, DPT 11/01/22 11:38 AM

## 2022-11-01 ENCOUNTER — Encounter (HOSPITAL_BASED_OUTPATIENT_CLINIC_OR_DEPARTMENT_OTHER): Payer: Self-pay | Admitting: Physical Therapy

## 2022-11-27 ENCOUNTER — Ambulatory Visit (HOSPITAL_BASED_OUTPATIENT_CLINIC_OR_DEPARTMENT_OTHER): Attending: Family Medicine | Admitting: Physical Therapy

## 2022-11-27 DIAGNOSIS — M542 Cervicalgia: Secondary | ICD-10-CM | POA: Insufficient documentation

## 2022-11-29 ENCOUNTER — Ambulatory Visit (HOSPITAL_BASED_OUTPATIENT_CLINIC_OR_DEPARTMENT_OTHER): Admitting: Physical Therapy

## 2022-11-29 DIAGNOSIS — M542 Cervicalgia: Secondary | ICD-10-CM

## 2022-11-29 NOTE — Therapy (Signed)
OUTPATIENT PHYSICAL THERAPY TREATMENT NOTE   Patient Name: Shirley Lee MRN: [id number] DOB:1960-11-02, 62 y.o., female Today's Date: 11/30/2022  END OF SESSION:  PT End of Session - 11/29/22 1104     Visit Number 10    Number of Visits 19    Date for PT Re-Evaluation 12/26/22    Authorization Type [employer]    PT Start Time 1027    PT Stop Time 1100    PT Time Calculation (min) 33 min    Activity Tolerance Patient tolerated treatment well    Behavior During Therapy [facility] for tasks assessed/performed                Past Medical History:  Diagnosis Date   Adult ADHD    Chicken pox    History of colon polyps    History of pneumonia 01/17/2022   Lisfranc's sprain, left, subsequent encounter 01/25/2019   Past Surgical History:  Procedure Laterality Date   ABDOMINAL HYSTERECTOMY  1989   Patient Active Problem List   Diagnosis Date Noted   Reactive airway disease 10/30/2022   History of colon polyps    COVID-19 08/26/2022   Neck stiffness 07/30/2022   Elevated alanine aminotransferase (ALT) level 01/17/2022   Hyperlipidemia 01/17/2022   Adhesive capsulitis of left shoulder 05/18/2019   Attention deficit disorder (ADD) in adult 04/29/2016    PCP: de [place], Buren Kos, MD  REFERRING PROVIDER: de [place], Raymond J, MD  REFERRING DIAG: M43.6 (ICD-10-CM) - Neck stiffness  THERAPY DIAG:  Cervicalgia  M25.60 Stiffness of unspecified joint, not elsewhere classified  Rationale for Evaluation and Treatment: Rehabilitation  ONSET DATE: PT order 07/30/2022  SUBJECTIVE:                                                                                                                                                                                                         SUBJECTIVE STATEMENT: Pt has been out of town in [place].  She states her neck did well while she was gone.  She didn't have pain or c/o's.  Pt states she performed her HEP some.  Pt had to lift  boxes and she felt good.      Hand dominance: Right  PERTINENT HISTORY:  L shoulder adhesive capsulitis  PAIN:  NPRS:  0/10 current, 1/10 worst Location:  central cervical Pt denies any radicular sx's including N/T down UE.   PRECAUTIONS: None  RED FLAGS: None     WEIGHT BEARING RESTRICTIONS: No  FALLS:  Has patient fallen in last 6 months? 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  Pt has a standing desk and tries to stand more.   PLOF: Independent  PATIENT GOALS: improve ROM, learn exercises to improve ROM.   OBJECTIVE:   DIAGNOSTIC FINDINGS:  PT unable to view x rays.  Pt states she had cervical x rays approx 3 years which showed arthritis.    TODAY'S TREATMENT:                                                                                                                                Manual Therapy: Static and dynamic cupping to bilat UT and cervical paraspinals seated to improve soft tissue tightness, myofascial adhesions and restrictions, and pain   Therapeutic Exercise: Reviewed pt presentation, pain levels, and HEP compliance. Seated UT stretch 3x30 sec bilat  Seated LS stretch 2x30 sec bilat   PATIENT EDUCATION:  Education details: dx, relevant anatomy, POC, HEP, HEP, exercise form, and rationale of interventions. Person educated: Patient Education method: Explanation, Demonstration, Verbal cues, and Handouts Education comprehension: verbalized understanding, returned demonstration, verbal cues required, and needs further education  HOME EXERCISE PROGRAM: Access Code: [email or web address] URL: [email or web address] Date: 09/17/2022 Prepared by: Aaron Edelman    ASSESSMENT:  CLINICAL IMPRESSION: Pt has been out of town and states her neck did well.  PT performed cupping to bilat UT and cervical paraspinals and she had an appropriate response.   She had multiple petichiae  with cupping.  PT educated pt in cupping response and showed pt response in mirror.  PT instructed her to drink water.  She required instruction in correct form with cervical stretches.  Pt responded well to Rx reporting improved tightness after Rx.  Patient should continue to benefit from continued skilled PT services to address ongoing goals and to improve ROM, tightness, strength, and function.    OBJECTIVE IMPAIRMENTS: decreased ROM, hypomobility, increased fascial restrictions, increased muscle spasms, impaired flexibility, postural dysfunction, and pain.   ACTIVITY LIMITATIONS:   PARTICIPATION LIMITATIONS: driving  PERSONAL FACTORS: Time since onset of injury/illness/exacerbation are also affecting patient's functional outcome.   REHAB POTENTIAL: Good  CLINICAL DECISION MAKING: Stable/uncomplicated  EVALUATION COMPLEXITY: Low   GOALS:   SHORT TERM GOALS: Target date: 10/08/2022   Pt will be independent and compliant with HEP for improved ROM, pain, and function.  Baseline:  Goal status:  GOAL MET 10/31/2022  2.  Pt will report at least a 25% improvement in cervical stiffness and mobility.  Baseline:  Goal status: GOAL MET 10/31/2022   LONG TERM GOALS: Target date: 12/26/2022  Pt's worst pain will be < 3/10 for improved daily mobility.  Baseline:  Goal status: GOAL MET 10/31/2022  2.  Pt will report she is able to turn her head without compensatory movement without significant difficulty for improved mobility with driving.  Baseline:  Goal status: PROGRESSING  3.  Pt will demo improved cervical AROM  to at least 30 deg in Sb'ing and 55 - 60 deg in rotation for improved cervical mobility with ADLs/IADLs. Baseline:  Goal status: PROGRESSING    PLAN:  PT FREQUENCY: 2x/week  PT DURATION: other: 8 weeks  (pt will be out of town during some of this time)  PLANNED INTERVENTIONS: Therapeutic exercises, Therapeutic activity, Neuromuscular re-education, Patient/Family  education, Self Care, Joint mobilization, Aquatic Therapy, Dry Needling, Electrical stimulation, Spinal mobilization, Cryotherapy, Moist heat, Taping, Traction, Ultrasound, Manual therapy, and Re-evaluation  PLAN FOR NEXT SESSION: Cont with cervical ROM, postural strengthening, flexibility, and soft tissue techniques.     Audie Clear III PT, DPT 12/01/22 8:12 AM

## 2022-11-30 ENCOUNTER — Encounter (HOSPITAL_BASED_OUTPATIENT_CLINIC_OR_DEPARTMENT_OTHER): Payer: Self-pay | Admitting: Physical Therapy

## 2022-12-03 ENCOUNTER — Encounter (HOSPITAL_BASED_OUTPATIENT_CLINIC_OR_DEPARTMENT_OTHER): Payer: Self-pay | Admitting: Family Medicine

## 2022-12-03 ENCOUNTER — Ambulatory Visit (HOSPITAL_BASED_OUTPATIENT_CLINIC_OR_DEPARTMENT_OTHER): Admitting: Radiology

## 2022-12-03 ENCOUNTER — Ambulatory Visit (HOSPITAL_BASED_OUTPATIENT_CLINIC_OR_DEPARTMENT_OTHER): Admitting: Physical Therapy

## 2022-12-03 ENCOUNTER — Ambulatory Visit (INDEPENDENT_AMBULATORY_CARE_PROVIDER_SITE_OTHER): Admitting: Family Medicine

## 2022-12-03 ENCOUNTER — Ambulatory Visit (INDEPENDENT_AMBULATORY_CARE_PROVIDER_SITE_OTHER)

## 2022-12-03 VITALS — BP 123/75 | HR 102 | Temp 99.0°F | Ht 64.5 in | Wt 141.8 lb

## 2022-12-03 DIAGNOSIS — Z8701 Personal history of pneumonia (recurrent): Secondary | ICD-10-CM

## 2022-12-03 DIAGNOSIS — R051 Acute cough: Secondary | ICD-10-CM | POA: Diagnosis not present

## 2022-12-03 MED ORDER — HYDROCODONE BIT-HOMATROP MBR 5-1.5 MG/5ML PO SOLN
5.0000 mL | Freq: Four times a day (QID) | ORAL | 0 refills | Status: DC | PRN
Start: 2022-12-03 — End: 2022-12-10

## 2022-12-03 MED ORDER — IPRATROPIUM BROMIDE 0.02 % IN SOLN
0.5000 mg | Freq: Once | RESPIRATORY_TRACT | Status: DC
Start: 2022-12-03 — End: 2022-12-10

## 2022-12-03 MED ORDER — ALBUTEROL SULFATE HFA 108 (90 BASE) MCG/ACT IN AERS: 1.0000 | INHALATION_SPRAY | RESPIRATORY_TRACT | 2 refills | Status: DC | PRN

## 2022-12-03 NOTE — Progress Notes (Signed)
Acute Office Visit  Subjective:     Patient ID: Shirley Lee, female    DOB: 11/04/1960, 62 y.o.   MRN: [id number]  Chief Complaint  Patient presents with   Cough    1 week, has used OTC cold and flu meds   Shortness of Breath   Wheezing   Shirley Lee is a 62 year old female patient who presents for an acute visit. She reports she has had a cough for the past week, without any relief with her inhaler use. Unsure if she has had a fever, feels chills on/off, and reports she has been taking Tylenol on/off for the past week. She reports having bronchitis last year and having it develop into pneumonia- and she wants to prevent it if she can. She reports she went to bed on Friday and did not get out of bed until Sunday- has been very fatigued. She has a difficulty time sleeping because of her cough.     Review of Systems  Constitutional:  Positive for fever and malaise/fatigue. Negative for chills.  HENT:  Negative for congestion and sinus pain.   Eyes:  Negative for blurred vision and double vision.  Respiratory:  Positive for cough, sputum production, shortness of breath and wheezing. Negative for hemoptysis.   Cardiovascular:  Negative for chest pain and palpitations.  Gastrointestinal:  Negative for abdominal pain, nausea and vomiting.  Genitourinary:  Negative for frequency and urgency.  Musculoskeletal:  Negative for myalgias.  Neurological:  Negative for dizziness, weakness and headaches.  Psychiatric/Behavioral:  Negative for depression and suicidal ideas. The patient has insomnia. The patient is not nervous/anxious.       Objective:    BP 123/75   Pulse (!) 102   Temp 99 F (37.2 C) (Oral)   Ht 5' 4.5" (1.638 m)   Wt 141 lb 12.8 oz (64.3 kg)   LMP  (LMP Unknown)   SpO2 96%   BMI 23.96 kg/m   Physical Exam Constitutional:      Appearance: She is well-developed.  Cardiovascular:     Rate and Rhythm: Regular rhythm. Tachycardia present.     Heart sounds:  Normal heart sounds.  Pulmonary:     Effort: Tachypnea present. No accessory muscle usage or respiratory distress.     Breath sounds: Decreased air movement present. Examination of the right-upper field reveals decreased breath sounds. Examination of the left-upper field reveals decreased breath sounds. Examination of the right-middle field reveals decreased breath sounds. Examination of the left-middle field reveals decreased breath sounds. Examination of the right-lower field reveals decreased breath sounds. Examination of the left-lower field reveals decreased breath sounds. Decreased breath sounds present.  Neurological:     Mental Status: She is alert.       Assessment & Plan:   1. Acute cough Patient is a pleasant 62 year old female patient who presents today with acute cough for about the past 7 days. She reports that last year she developed bronchitis, which progressed into pneumonia. She reports no relief with her inhaler, occasional wheezing, frequent cough with sputum production that started today. Vital signs reviewed- notable for tachycardia and increased temperature. Physical exam with decreased breath sounds in all lung fields posteriorly and bilaterally. Duo Neb given to patient in office today. Lung sounds improved with slight decrease in upper posterior lobes after DuoNeb treatment. Will obtain CXR due to duration of symptoms. Prescribed cough syrup for cough relief. Advised patient to stay adequately hydrated and to continue treating fever with Tylenol or  Advil. Will treat accordingly once CXR returns.  - ipratropium (ATROVENT) nebulizer solution 0.5 mg - DG Chest 2 View; Future - albuterol (VENTOLIN HFA) 108 (90 Base) MCG/ACT inhaler; Inhale 1-2 puffs into the lungs every 4 (four) hours as needed for wheezing or shortness of breath.  Dispense: 6.7 g; Refill: 2 - HYDROcodone bit-homatropine (HYCODAN) 5-1.5 MG/5ML syrup; Take 5 mLs by mouth every 6 (six) hours as needed for cough.   Dispense: 120 mL; Refill: 0  2. History of pneumonia See #1 - ipratropium (ATROVENT) nebulizer solution 0.5 mg - DG Chest 2 View; Future   No follow-ups on file.  [name], FNP

## 2022-12-04 ENCOUNTER — Encounter (HOSPITAL_BASED_OUTPATIENT_CLINIC_OR_DEPARTMENT_OTHER): Payer: Self-pay | Admitting: Family Medicine

## 2022-12-04 DIAGNOSIS — B9689 Other specified bacterial agents as the cause of diseases classified elsewhere: Secondary | ICD-10-CM

## 2022-12-04 MED ORDER — AZITHROMYCIN 250 MG PO TABS
ORAL_TABLET | ORAL | 0 refills | Status: AC
Start: 2022-12-04 — End: 2022-12-09

## 2022-12-05 ENCOUNTER — Encounter (HOSPITAL_BASED_OUTPATIENT_CLINIC_OR_DEPARTMENT_OTHER): Admitting: Physical Therapy

## 2022-12-05 ENCOUNTER — Encounter (HOSPITAL_BASED_OUTPATIENT_CLINIC_OR_DEPARTMENT_OTHER): Payer: Self-pay | Admitting: Family Medicine

## 2022-12-09 ENCOUNTER — Other Ambulatory Visit (HOSPITAL_BASED_OUTPATIENT_CLINIC_OR_DEPARTMENT_OTHER): Payer: Self-pay | Admitting: *Deleted

## 2022-12-09 MED ORDER — MELOXICAM 7.5 MG PO TABS: 7.5000 mg | ORAL_TABLET | Freq: Every day | ORAL | 0 refills | Status: DC

## 2022-12-10 ENCOUNTER — Ambulatory Visit (HOSPITAL_BASED_OUTPATIENT_CLINIC_OR_DEPARTMENT_OTHER): Admitting: Physical Therapy

## 2022-12-10 ENCOUNTER — Ambulatory Visit (HOSPITAL_BASED_OUTPATIENT_CLINIC_OR_DEPARTMENT_OTHER): Admitting: Family Medicine

## 2022-12-10 ENCOUNTER — Encounter (HOSPITAL_BASED_OUTPATIENT_CLINIC_OR_DEPARTMENT_OTHER): Payer: Self-pay | Admitting: Family Medicine

## 2022-12-10 ENCOUNTER — Encounter (HOSPITAL_BASED_OUTPATIENT_CLINIC_OR_DEPARTMENT_OTHER): Payer: Self-pay | Admitting: Physical Therapy

## 2022-12-10 DIAGNOSIS — R059 Cough, unspecified: Secondary | ICD-10-CM | POA: Insufficient documentation

## 2022-12-10 DIAGNOSIS — R051 Acute cough: Secondary | ICD-10-CM

## 2022-12-10 DIAGNOSIS — M542 Cervicalgia: Secondary | ICD-10-CM

## 2022-12-10 MED ORDER — HYDROCODONE BIT-HOMATROP MBR 5-1.5 MG/5ML PO SOLN
5.0000 mL | Freq: Four times a day (QID) | ORAL | 0 refills | Status: DC | PRN
Start: 2022-12-10 — End: 2023-04-01

## 2022-12-10 NOTE — Assessment & Plan Note (Signed)
Patient presents for follow-up of cough.  She was seen last week and at that time had had a 1 week history of cough.  She received nebulizer treatment in the office, did have chest x-ray completed which was normal.  She initially was utilizing cough syrup and albuterol inhaler.  She did have continued symptoms and ultimately was prescribed a course of azithromycin, she completed this course of antibiotics yesterday.  She does continue to have intermittent coughing, recently ran out of cough syrup.  She denies any fever, chills, sweats.  No significant shortness of breath or difficulty breathing. On exam, patient in no acute distress, vital signs stable.  She presents wearing mask, does have intermittent coughing during encounter.  Cardiovascular exam with regular rate and rhythm, lungs clear to auscultation bilaterally. Reviewed prior encounter documentation, treatments, chest x-ray imaging.  I do feel that patient likely is dealing with resolving viral infection.  Do suspect underlying viral bronchitis.  Do not feel that there is a role for antibiotic therapy at this time, did complete antibiotic course recently with azithromycin.  Pulmonary exam was normal in the office today, this do not feel strongly that repeat chest x-ray would be needed at this time.  Can continue with symptomatic management, or refill of cough medicine sent to pharmacy on file.  Can continue with use of albuterol.  She reports that she was diagnosed by prior PCP with "adult onset asthma".  Ultimately, patient may benefit from evaluation with pulmonology for further testing. Discussed precautions, particularly if any worsening is noted, development of shortness of breath or trouble breathing, particularly with no or light activity.  In this situation, recommend returning to the office or presenting to emergency department for evaluation.  Discussed expectation that symptoms should improve over the next couple weeks as there can be  postviral cough syndrome which can persist for 4 to 6 weeks following symptom onset.

## 2022-12-10 NOTE — Therapy (Signed)
OUTPATIENT PHYSICAL THERAPY TREATMENT NOTE   Patient Name: Shirley Lee MRN: [id number] DOB:1960-02-10, 62 y.o., female Today's Date: 12/10/2022  END OF SESSION:  PT End of Session - 12/10/22 2118     Visit Number 11    Number of Visits 19    Date for PT Re-Evaluation 12/26/22    Authorization Type [employer]    PT Start Time 0854    PT Stop Time 0922    PT Time Calculation (min) 28 min    Activity Tolerance Patient tolerated treatment well    Behavior During Therapy [facility] for tasks assessed/performed                 Past Medical History:  Diagnosis Date   Adult ADHD    Chicken pox    History of colon polyps    History of pneumonia 01/17/2022   Lisfranc's sprain, left, subsequent encounter 01/25/2019   Past Surgical History:  Procedure Laterality Date   ABDOMINAL HYSTERECTOMY  1989   Patient Active Problem List   Diagnosis Date Noted   Cough 12/10/2022   Reactive airway disease 10/30/2022   History of colon polyps    COVID-19 08/26/2022   Neck stiffness 07/30/2022   Elevated alanine aminotransferase (ALT) level 01/17/2022   Hyperlipidemia 01/17/2022   Adhesive capsulitis of left shoulder 05/18/2019   Attention deficit disorder (ADD) in adult 04/29/2016    PCP: de [place], Buren Kos, MD  REFERRING PROVIDER: de [place], Raymond J, MD  REFERRING DIAG: M43.6 (ICD-10-CM) - Neck stiffness  THERAPY DIAG:  Cervicalgia  M25.60 Stiffness of unspecified joint, not elsewhere classified  Rationale for Evaluation and Treatment: Rehabilitation  ONSET DATE: PT order 07/30/2022  SUBJECTIVE:                                                                                                                                                                                                         SUBJECTIVE STATEMENT: Pt began having a cough and saw MD on 11/19.  She had a breathing treatment and prescribed steroids.  Pt had an x ray of lungs which were clear.  Pt continues  to have a cough.  She denies having a fever.  Pt is seeing MD later today.  Pt reports her neck has been great.  Pt states she has just been laying around lately.  Pt denies pain currently.     Hand dominance: Right  PERTINENT HISTORY:  L shoulder adhesive capsulitis  PAIN:  NPRS:  0/10 current, 1/10 worst Location:  central cervical Pt denies any radicular sx's  including N/T down UE.   PRECAUTIONS: None  RED FLAGS: None     WEIGHT BEARING RESTRICTIONS: No  FALLS:  Has patient fallen in last 6 months? 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  Pt has a standing desk and tries to stand more.   PLOF: Independent  PATIENT GOALS: improve ROM, learn exercises to improve ROM.   OBJECTIVE:   DIAGNOSTIC FINDINGS:  PT unable to view x rays.  Pt states she had cervical x rays approx 3 years which showed arthritis.    TODAY'S TREATMENT:                                                                                                                                Manual Therapy: Reviewed pt presentation and pain level. Static and dynamic cupping to bilat UT and cervical paraspinals seated to improve soft tissue tightness, myofascial adhesions and restrictions, and pain    PATIENT EDUCATION:  Education details: dx, relevant anatomy, POC, cupping response, and rationale of interventions. Person educated: Patient Education method: Explanation, Demonstration, Verbal cues, and Handouts Education comprehension: verbalized understanding, returned demonstration, verbal cues required, and needs further education  HOME EXERCISE PROGRAM: Access Code: [email or web address] URL: [email or web address] Date: 09/17/2022 Prepared by: Aaron Edelman    ASSESSMENT:  CLINICAL IMPRESSION: PT just performed MT and didn't perform exercises due to pt's continued cough sx's.  Pt is seeing MD later today.  Pt continues to  have tightness in bilat UT though is improving.  She had an appropriate response to static and dynamic cupping.  Pt had multiple petichiae with cupping though less petichiae than prior.   She is improving with soft tissue mobility as evidenced by response.  PT educated pt in cupping response and instructed her to drink water.  Pt responded well to Rx having no c/o's after Rx.  She reports no pain before and after Rx.   OBJECTIVE IMPAIRMENTS: decreased ROM, hypomobility, increased fascial restrictions, increased muscle spasms, impaired flexibility, postural dysfunction, and pain.   ACTIVITY LIMITATIONS:   PARTICIPATION LIMITATIONS: driving  PERSONAL FACTORS: Time since onset of injury/illness/exacerbation are also affecting patient's functional outcome.   REHAB POTENTIAL: Good  CLINICAL DECISION MAKING: Stable/uncomplicated  EVALUATION COMPLEXITY: Low   GOALS:   SHORT TERM GOALS: Target date: 10/08/2022   Pt will be independent and compliant with HEP for improved ROM, pain, and function.  Baseline:  Goal status:  GOAL MET 10/31/2022  2.  Pt will report at least a 25% improvement in cervical stiffness and mobility.  Baseline:  Goal status: GOAL MET 10/31/2022   LONG TERM GOALS: Target date: 12/26/2022  Pt's worst pain will be < 3/10 for improved daily mobility.  Baseline:  Goal status: GOAL MET 10/31/2022  2.  Pt will report she is able to turn her head without compensatory movement without significant difficulty for improved mobility with driving.  Baseline:  Goal status: PROGRESSING  3.  Pt will demo improved cervical AROM to at least 30 deg in Sb'ing and 55 - 60 deg in rotation for improved cervical mobility with ADLs/IADLs. Baseline:  Goal status: PROGRESSING    PLAN:  PT FREQUENCY: 2x/week  PT DURATION: other: 8 weeks  (pt will be out of town during some of this time)  PLANNED INTERVENTIONS: Therapeutic exercises, Therapeutic activity, Neuromuscular  re-education, Patient/Family education, Self Care, Joint mobilization, Aquatic Therapy, Dry Needling, Electrical stimulation, Spinal mobilization, Cryotherapy, Moist heat, Taping, Traction, Ultrasound, Manual therapy, and Re-evaluation  PLAN FOR NEXT SESSION: Cont with cervical ROM, postural strengthening, flexibility, and soft tissue techniques.     Audie Clear III PT, DPT 12/10/22 9:35 PM

## 2022-12-10 NOTE — Progress Notes (Signed)
    Procedures performed today:    None.  Independent interpretation of notes and tests performed by another provider:   None.  Brief History, Exam, Impression, and Recommendations:    BP 111/73 (BP Location: Left Arm, Patient Position: Sitting, Cuff Size: Normal)   Pulse 95   Temp (!) 97.5 F (36.4 C)   Ht 5\' 4"  (1.626 m)   Wt 142 lb 4.8 oz (64.5 kg)   LMP  (LMP Unknown)   SpO2 100%   BMI 24.43 kg/m   Acute cough Assessment & Plan: Patient presents for follow-up of cough.  She was seen last week and at that time had had a 1 week history of cough.  She received nebulizer treatment in the office, did have chest x-ray completed which was normal.  She initially was utilizing cough syrup and albuterol inhaler.  She did have continued symptoms and ultimately was prescribed a course of azithromycin, she completed this course of antibiotics yesterday.  She does continue to have intermittent coughing, recently ran out of cough syrup.  She denies any fever, chills, sweats.  No significant shortness of breath or difficulty breathing. On exam, patient in no acute distress, vital signs stable.  She presents wearing mask, does have intermittent coughing during encounter.  Cardiovascular exam with regular rate and rhythm, lungs clear to auscultation bilaterally. Reviewed prior encounter documentation, treatments, chest x-ray imaging.  I do feel that patient likely is dealing with resolving viral infection.  Do suspect underlying viral bronchitis.  Do not feel that there is a role for antibiotic therapy at this time, did complete antibiotic course recently with azithromycin.  Pulmonary exam was normal in the office today, this do not feel strongly that repeat chest x-ray would be needed at this time.  Can continue with symptomatic management, or refill of cough medicine sent to pharmacy on file.  Can continue with use of albuterol.  She reports that she was diagnosed by prior PCP with "adult onset  asthma".  Ultimately, patient may benefit from evaluation with pulmonology for further testing. Discussed precautions, particularly if any worsening is noted, development of shortness of breath or trouble breathing, particularly with no or light activity.  In this situation, recommend returning to the office or presenting to emergency department for evaluation.  Discussed expectation that symptoms should improve over the next couple weeks as there can be postviral cough syndrome which can persist for 4 to 6 weeks following symptom onset.  Orders: -     HYDROcodone Bit-Homatrop MBr; Take 5 mLs by mouth every 6 (six) hours as needed for cough.  Dispense: 120 mL; Refill: 0  Return in about 1 month (around 01/09/2023) for CPE.   ___________________________________________ Kerissa Coia de [place], MD, ABFM, CAQSM Primary Care and Sports Medicine [facility]

## 2022-12-16 ENCOUNTER — Other Ambulatory Visit (HOSPITAL_BASED_OUTPATIENT_CLINIC_OR_DEPARTMENT_OTHER): Payer: Self-pay | Admitting: Family Medicine

## 2022-12-16 DIAGNOSIS — F988 Other specified behavioral and emotional disorders with onset usually occurring in childhood and adolescence: Secondary | ICD-10-CM

## 2022-12-17 ENCOUNTER — Encounter (HOSPITAL_BASED_OUTPATIENT_CLINIC_OR_DEPARTMENT_OTHER): Payer: Self-pay | Admitting: Physical Therapy

## 2022-12-17 ENCOUNTER — Ambulatory Visit (HOSPITAL_BASED_OUTPATIENT_CLINIC_OR_DEPARTMENT_OTHER): Attending: Family Medicine | Admitting: Physical Therapy

## 2022-12-17 DIAGNOSIS — M542 Cervicalgia: Secondary | ICD-10-CM | POA: Insufficient documentation

## 2022-12-17 NOTE — Therapy (Signed)
OUTPATIENT PHYSICAL THERAPY TREATMENT NOTE   Patient Name: Shirley Lee MRN: [id number] DOB:1960-10-20, 62 y.o., female Today's Date: 12/18/2022  END OF SESSION:  PT End of Session - 12/17/22 0913     Visit Number 12    Number of Visits 19    Date for PT Re-Evaluation 12/26/22    Authorization Type [employer]    PT Start Time 0850    PT Stop Time 0931    PT Time Calculation (min) 41 min    Activity Tolerance Patient tolerated treatment well    Behavior During Therapy [facility] for tasks assessed/performed                  Past Medical History:  Diagnosis Date   Adult ADHD    Chicken pox    History of colon polyps    History of pneumonia 01/17/2022   Lisfranc's sprain, left, subsequent encounter 01/25/2019   Past Surgical History:  Procedure Laterality Date   ABDOMINAL HYSTERECTOMY  1989   Patient Active Problem List   Diagnosis Date Noted   Cough 12/10/2022   Reactive airway disease 10/30/2022   History of colon polyps    COVID-19 08/26/2022   Neck stiffness 07/30/2022   Elevated alanine aminotransferase (ALT) level 01/17/2022   Hyperlipidemia 01/17/2022   Adhesive capsulitis of left shoulder 05/18/2019   Attention deficit disorder (ADD) in adult 04/29/2016    PCP: de [place], Buren Kos, MD  REFERRING PROVIDER: de [place], Raymond J, MD  REFERRING DIAG: M43.6 (ICD-10-CM) - Neck stiffness  THERAPY DIAG:  Cervicalgia  M25.60 Stiffness of unspecified joint, not elsewhere classified  Rationale for Evaluation and Treatment: Rehabilitation  ONSET DATE: PT order 07/30/2022  SUBJECTIVE:                                                                                                                                                                                                         SUBJECTIVE STATEMENT: Pt states she hasn't done anything recently due to her cough/not feeling well.  Pt states she has been lying around and hasn't been performing her HEP.  Pt  is feeling better now.  Pt states her neck was bothering her yesterday, but not having any pain today.    Hand dominance: Right  PERTINENT HISTORY:  L shoulder adhesive capsulitis  PAIN:  NPRS:  0/10 current, 1/10 worst Location:  central cervical Pt denies any radicular sx's including N/T down UE.   PRECAUTIONS: None  RED FLAGS: None     WEIGHT BEARING RESTRICTIONS: No  FALLS:  Has patient  fallen in last 6 months? 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  Pt has a standing desk and tries to stand more.   PLOF: Independent  PATIENT GOALS: improve ROM, learn exercises to improve ROM.   OBJECTIVE:   DIAGNOSTIC FINDINGS:  PT unable to view x rays.  Pt states she had cervical x rays approx 3 years which showed arthritis.    TODAY'S TREATMENT:                                                                                                                                Manual Therapy: Static and dynamic cupping to bilat UT and cervical paraspinals seated to improve soft tissue tightness, myofascial adhesions and restrictions, and pain  Therapeutic Exercise: Reviewed pt presentation and pain level. Rows with retraction with GTB 2x10 Standing shoulder extension with retraction with GTB 2x12 Standing bilateral shoulder ER RTB 2x 15 Standing shoulder hz abd 2x10 with GTB 4D ball rolls on wall 2x10 each Seated UT stretch 2x30 sec bilat Seated LS stretch 2x30 sec bilat    PATIENT EDUCATION:  Education details: dx, relevant anatomy, POC, exercise form, cupping response, and rationale of interventions. Person educated: Patient Education method: Explanation, Demonstration, Verbal cues, and Handouts Education comprehension: verbalized understanding, returned demonstration, verbal cues required, and needs further education  HOME EXERCISE PROGRAM: Access Code: [email or web address] URL:  [email or web address] Date: 09/17/2022 Prepared by: Aaron Edelman    ASSESSMENT:  CLINICAL IMPRESSION: Pt is feeling better now.  PT performed cupping to improve soft tissue tightness and restrictions, mobility, and pain.   She had an appropriate response to static and dynamic cupping.  PT showed pt her response in the mirror and instructed her to drink water.  Pt performed theraband exercises well.  She requires cuing for correct form with cervical stretches.  Pt responded well to Rx having no pain and no c/o's after Rx.  She should benefit from cont skilled PT services to address impairments and goals and to improve function.     OBJECTIVE IMPAIRMENTS: decreased ROM, hypomobility, increased fascial restrictions, increased muscle spasms, impaired flexibility, postural dysfunction, and pain.   ACTIVITY LIMITATIONS:   PARTICIPATION LIMITATIONS: driving  PERSONAL FACTORS: Time since onset of injury/illness/exacerbation are also affecting patient's functional outcome.   REHAB POTENTIAL: Good  CLINICAL DECISION MAKING: Stable/uncomplicated  EVALUATION COMPLEXITY: Low   GOALS:   SHORT TERM GOALS: Target date: 10/08/2022   Pt will be independent and compliant with HEP for improved ROM, pain, and function.  Baseline:  Goal status:  GOAL MET 10/31/2022  2.  Pt will report at least a 25% improvement in cervical stiffness and mobility.  Baseline:  Goal status: GOAL MET 10/31/2022   LONG TERM GOALS: Target date: 12/26/2022  Pt's worst pain will be < 3/10 for improved daily mobility.  Baseline:  Goal status: GOAL MET 10/31/2022  2.  Pt  will report she is able to turn her head without compensatory movement without significant difficulty for improved mobility with driving.  Baseline:  Goal status: PROGRESSING  3.  Pt will demo improved cervical AROM to at least 30 deg in Sb'ing and 55 - 60 deg in rotation for improved cervical mobility with ADLs/IADLs. Baseline:   Goal status: PROGRESSING    PLAN:  PT FREQUENCY: 2x/week  PT DURATION: other: 8 weeks  (pt will be out of town during some of this time)  PLANNED INTERVENTIONS: Therapeutic exercises, Therapeutic activity, Neuromuscular re-education, Patient/Family education, Self Care, Joint mobilization, Aquatic Therapy, Dry Needling, Electrical stimulation, Spinal mobilization, Cryotherapy, Moist heat, Taping, Traction, Ultrasound, Manual therapy, and Re-evaluation  PLAN FOR NEXT SESSION: Cont with cervical ROM, postural strengthening, flexibility, and soft tissue techniques.     Audie Clear III PT, DPT 12/18/22 8:31 AM

## 2022-12-18 MED ORDER — AMPHETAMINE-DEXTROAMPHET ER 30 MG PO CP24
30.0000 mg | ORAL_CAPSULE | Freq: Every day | ORAL | 0 refills | Status: DC
Start: 2022-12-18 — End: 2023-01-22

## 2022-12-19 ENCOUNTER — Ambulatory Visit (HOSPITAL_BASED_OUTPATIENT_CLINIC_OR_DEPARTMENT_OTHER): Admitting: Physical Therapy

## 2022-12-24 ENCOUNTER — Ambulatory Visit (HOSPITAL_BASED_OUTPATIENT_CLINIC_OR_DEPARTMENT_OTHER): Admitting: Physical Therapy

## 2022-12-26 ENCOUNTER — Ambulatory Visit (HOSPITAL_BASED_OUTPATIENT_CLINIC_OR_DEPARTMENT_OTHER): Admitting: Physical Therapy

## 2022-12-31 ENCOUNTER — Ambulatory Visit (HOSPITAL_BASED_OUTPATIENT_CLINIC_OR_DEPARTMENT_OTHER): Admitting: Physical Therapy

## 2022-12-31 DIAGNOSIS — M542 Cervicalgia: Secondary | ICD-10-CM

## 2022-12-31 NOTE — Therapy (Incomplete)
OUTPATIENT PHYSICAL THERAPY TREATMENT NOTE   Patient Name: Shirley Lee MRN: [id number] DOB:05-12-1960, 62 y.o., female Today's Date: 01/01/2023  END OF SESSION:  PT End of Session - 12/31/22 0903     Visit Number 13    Number of Visits 14    Date for PT Re-Evaluation 01/07/23    Authorization Type [employer]    PT Start Time 0858    PT Stop Time 0937    PT Time Calculation (min) 39 min    Activity Tolerance Patient tolerated treatment well    Behavior During Therapy [facility] for tasks assessed/performed                  Past Medical History:  Diagnosis Date   Adult ADHD    Chicken pox    History of colon polyps    History of pneumonia 01/17/2022   Lisfranc's sprain, left, subsequent encounter 01/25/2019   Past Surgical History:  Procedure Laterality Date   ABDOMINAL HYSTERECTOMY  1989   Patient Active Problem List   Diagnosis Date Noted   Cough 12/10/2022   Reactive airway disease 10/30/2022   History of colon polyps    COVID-19 08/26/2022   Neck stiffness 07/30/2022   Elevated alanine aminotransferase (ALT) level 01/17/2022   Hyperlipidemia 01/17/2022   Adhesive capsulitis of left shoulder 05/18/2019   Attention deficit disorder (ADD) in adult 04/29/2016    PCP: de [place], Buren Kos, MD  REFERRING PROVIDER: de [place], Raymond J, MD  REFERRING DIAG: M43.6 (ICD-10-CM) - Neck stiffness  THERAPY DIAG:  Cervicalgia  M25.60 Stiffness of unspecified joint, not elsewhere classified  Rationale for Evaluation and Treatment: Rehabilitation  ONSET DATE: PT order 07/30/2022  SUBJECTIVE:                                                                                                                                                                                                         SUBJECTIVE STATEMENT: Pt states she has tightness if she sits for a long time at the computer.  She has a standing desk which she uses, but has been sitting more lately.  Pt  states she was doing good until yesterday.  Pt had pain in cervical yesterday which was the first time in a long time.     Hand dominance: Right  PERTINENT HISTORY:  L shoulder adhesive capsulitis  PAIN:  NPRS:  0/10 current Location:  central cervical Pt denies any radicular sx's including N/T down UE.   PRECAUTIONS: None  RED FLAGS: None     WEIGHT BEARING RESTRICTIONS:  No  FALLS:  Has patient fallen in last 6 months? 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  Pt has a standing desk and tries to stand more.   PLOF: Independent  PATIENT GOALS: improve ROM, learn exercises to improve ROM.   OBJECTIVE:   DIAGNOSTIC FINDINGS:  PT unable to view x rays.  Pt states she had cervical x rays approx 3 years which showed arthritis.    TODAY'S TREATMENT:                                                                                                                                CERVICAL ROM:    Active ROM A/PROM (deg) eval AROM 10/17 AROM 12/17  Flexion [facility]  Extension 30 44 48  Right lateral flexion 18 33 33  Left lateral flexion 17 23 26   Right rotation 39 48 52  Left rotation 38 40 45   (Blank rows = not tested)   Pt had no pain with cervical AROM   UPPER EXTREMITY ROM:   Active ROM Right eval Left eval Right 10/17 Left 10/17 Right 12/17 Left 12/17  Shoulder flexion 144 148 153 157 161 158  Shoulder extension            Shoulder abduction 152 158 152 158    Shoulder adduction            Shoulder extension            Shoulder internal rotation            Shoulder external rotation            Elbow flexion            Elbow extension            Wrist flexion            Wrist extension            Wrist ulnar deviation            Wrist radial deviation            Wrist pronation            Wrist supination             (Blank rows = not tested)  Manual Therapy: Static and  dynamic cupping to bilat UT and cervical paraspinals seated to improve soft tissue tightness, myofascial adhesions and restrictions, and pain  Therapeutic Exercise: Reviewed pt presentation and pain level.  Standing bilateral shoulder ER GTB 2x10 Standing shoulder hz abd 2x10 with GTB 4D ball rolls on wall x10 each Seated UT stretch 2x30 sec bilat Seated LS stretch 2x30 sec bilat  Reviewed HEP.    PATIENT EDUCATION:  Education details: dx, relevant anatomy, POC, exercise form, cupping response, and rationale of interventions. Person educated: Patient Education method: Explanation, Demonstration, Verbal cues,  and Handouts Education comprehension: verbalized understanding, returned demonstration, verbal cues required, and needs further education  HOME EXERCISE PROGRAM: Access Code: [email or web address] URL: [email or web address] Date: 09/17/2022 Prepared by: Aaron Edelman    ASSESSMENT:  CLINICAL IMPRESSION: Pt's neck has been doing well though had increased pain yesterday.  This was the first time she had had pain in awhile.  PT assessed cervical AROM.  Though she is still limited with ROM, she demonstrates improved cervical AROM t/o.  Pt also demonstrated improved R shoulder flexion AROM.  Pt has improved soft tissue tightness in bilat UT.  She had an appropriate response to static and dynamic cupping.  PT reviewed HEP and educated pt in correct form.  She required minimal cues for correct form with cervical stretches.  Pt responded well to Rx having no pain and no c/o's after Rx.    OBJECTIVE IMPAIRMENTS: decreased ROM, hypomobility, increased fascial restrictions, increased muscle spasms, impaired flexibility, postural dysfunction, and pain.   ACTIVITY LIMITATIONS:   PARTICIPATION LIMITATIONS: driving  PERSONAL FACTORS: Time since onset of injury/illness/exacerbation are also affecting patient's functional outcome.   REHAB POTENTIAL: Good  CLINICAL DECISION MAKING:  Stable/uncomplicated  EVALUATION COMPLEXITY: Low   GOALS:   SHORT TERM GOALS: Target date: 10/08/2022   Pt will be independent and compliant with HEP for improved ROM, pain, and function.  Baseline:  Goal status:  GOAL MET 10/31/2022  2.  Pt will report at least a 25% improvement in cervical stiffness and mobility.  Baseline:  Goal status: GOAL MET 10/31/2022   LONG TERM GOALS: Target date: 12/26/2022  Pt's worst pain will be < 3/10 for improved daily mobility.  Baseline:  Goal status: GOAL MET 10/31/2022  2.  Pt will report she is able to turn her head without compensatory movement without significant difficulty for improved mobility with driving.  Baseline:  Goal status: IMPROVED BUT NOT MET  12/17  3.  Pt will demo improved cervical AROM to at least 30 deg in Sb'ing and 55 - 60 deg in rotation for improved cervical mobility with ADLs/IADLs. Baseline:  Goal status: 30% MET 12/17    PLAN:  PT FREQUENCY: 2 visits   PT DURATION:  1 week  PLANNED INTERVENTIONS: Therapeutic exercises, Therapeutic activity, Neuromuscular re-education, Patient/Family education, Self Care, Joint mobilization, Aquatic Therapy, Dry Needling, Electrical stimulation, Spinal mobilization, Cryotherapy, Moist heat, Taping, Traction, Ultrasound, Manual therapy, and Re-evaluation  PLAN FOR NEXT SESSION:  Review HEP.   Update HEP as needed.  Discharge pt next visit.    Audie Clear III PT, DPT 01/01/23 7:25 AM

## 2023-01-01 ENCOUNTER — Ambulatory Visit (HOSPITAL_BASED_OUTPATIENT_CLINIC_OR_DEPARTMENT_OTHER): Admitting: Physical Therapy

## 2023-01-01 ENCOUNTER — Encounter (HOSPITAL_BASED_OUTPATIENT_CLINIC_OR_DEPARTMENT_OTHER): Payer: Self-pay | Admitting: Physical Therapy

## 2023-01-01 DIAGNOSIS — M542 Cervicalgia: Secondary | ICD-10-CM | POA: Diagnosis not present

## 2023-01-01 NOTE — Therapy (Signed)
OUTPATIENT PHYSICAL THERAPY TREATMENT NOTE   Patient Name: Shirley Lee MRN: [id number] DOB:[birth year], 62 y.o., female Today's Date: 01/02/2023  END OF SESSION:  PT End of Session - 01/01/23 0849     Visit Number 13    Number of Visits 13    Date for PT Re-Evaluation 01/07/23    Authorization Type [employer]    PT Start Time 0808    PT Stop Time 0842    PT Time Calculation (min) 34 min    Activity Tolerance Patient tolerated treatment well    Behavior During Therapy [facility] for tasks assessed/performed                   Past Medical History:  Diagnosis Date   Adult ADHD    Chicken pox    History of colon polyps    History of pneumonia 01/17/2022   Lisfranc's sprain, left, subsequent encounter 01/25/2019   Past Surgical History:  Procedure Laterality Date   ABDOMINAL HYSTERECTOMY  1989   Patient Active Problem List   Diagnosis Date Noted   Cough 12/10/2022   Reactive airway disease 10/30/2022   History of colon polyps    COVID-19 08/26/2022   Neck stiffness 07/30/2022   Elevated alanine aminotransferase (ALT) level 01/17/2022   Hyperlipidemia 01/17/2022   Adhesive capsulitis of left shoulder 05/18/2019   Attention deficit disorder (ADD) in adult 04/29/2016    PCP: de [place], Buren Kos, MD  REFERRING PROVIDER: de [place], Raymond J, MD  REFERRING DIAG: M43.6 (ICD-10-CM) - Neck stiffness  THERAPY DIAG:  Cervicalgia  M25.60 Stiffness of unspecified joint, not elsewhere classified  Rationale for Evaluation and Treatment: Rehabilitation  ONSET DATE: PT order 07/30/2022  SUBJECTIVE:                                                                                                                                                                                                         SUBJECTIVE STATEMENT: Pt states she felt good after prior Rx.  Her tightness is better.  Pt denies pain currently.  Pt states she is able to turn her head better in the car  this AM.  Pt is appreciative of PT services.     Hand dominance: Right  PERTINENT HISTORY:  L shoulder adhesive capsulitis  PAIN:  NPRS:  0/10 current Location:  central cervical Pt denies any radicular sx's including N/T down UE.   PRECAUTIONS: None  RED FLAGS: None     WEIGHT BEARING RESTRICTIONS: No  FALLS:  Has patient fallen in last 6 months? 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  Pt has a standing desk and tries to stand more.   PLOF: Independent  PATIENT GOALS: improve ROM, learn exercises to improve ROM.   OBJECTIVE:   DIAGNOSTIC FINDINGS:  PT unable to view x rays.  Pt states she had cervical x rays approx 3 years which showed arthritis.    PRIOR TREATMENT:                                                                                                                                CERVICAL ROM:    Active ROM A/PROM (deg) eval AROM 10/17 AROM 12/17  Flexion [facility]  Extension 30 44 48  Right lateral flexion 18 33 33  Left lateral flexion 17 23 26   Right rotation 39 48 52  Left rotation 38 40 45   (Blank rows = not tested)   Pt had no pain with cervical AROM   UPPER EXTREMITY ROM:   Active ROM Right eval Left eval Right 10/17 Left 10/17 Right 12/17 Left 12/17  Shoulder flexion 144 148 153 157 161 158  Shoulder extension            Shoulder abduction 152 158 152 158    Shoulder adduction            Shoulder extension            Shoulder internal rotation            Shoulder external rotation            Elbow flexion            Elbow extension            Wrist flexion            Wrist extension            Wrist ulnar deviation            Wrist radial deviation            Wrist pronation            Wrist supination             (Blank rows = not tested)  TODAY'S TREATMENT: Therapeutic Exercise: Reviewed pt presentation and pain level.  Standing rows  with retraction with GTB 2x10 Standing shoulder extension with GTB 2x10 Standing bilateral shoulder ER GTB 2x10 Standing shoulder hz abd 2x10 with GTB Seated UT stretch 2x20-30 sec bilat Seated LS stretch 2x20-30 sec bilat  Reviewed HEP and updated HEP.  Pt received an updated HEP handout and was educated in correct form and appropriate frequency.   FOTO:  71 / 74. Goal of 65  PATIENT EDUCATION:  Education details: dx, relevant anatomy, POC, exercise form, cupping response, and rationale of interventions. Person educated: Patient Education method: Explanation, Demonstration, Verbal cues, and Handouts Education comprehension:  verbalized understanding, returned demonstration, verbal cues required, and needs further education  HOME EXERCISE PROGRAM: Access Code: [email or web address] URL: [email or web address] Date: 09/17/2022 Prepared by: Aaron Edelman    ASSESSMENT:  CLINICAL IMPRESSION: Pt has made great progress in PT.  Pt has not really been having neck pain though did have some a couple of days ago.  Pt continues to have limitations in ROM, though has improved  with AROM t/o cervical.  Pt has also improved soft tissue tightness in bilat UT.  Pt reports she was able to turn head easier in the car without compensation this AM.  PT thoroughly went through HEP and updated HEP.  Pt received an updated handout and is independent with HEP.  Pt met all STG's and LTG #1, partially met LTG#3, and made progress toward LTG #2.  Pt had already met FOTO goal though demonstrates improved FOTO score today.  Pt is ready for discharge.     OBJECTIVE IMPAIRMENTS: decreased ROM, hypomobility, increased fascial restrictions, increased muscle spasms, impaired flexibility, postural dysfunction, and pain.   ACTIVITY LIMITATIONS:   PARTICIPATION LIMITATIONS: driving  PERSONAL FACTORS: Time since onset of injury/illness/exacerbation are also affecting patient's functional outcome.   REHAB POTENTIAL:  Good  CLINICAL DECISION MAKING: Stable/uncomplicated  EVALUATION COMPLEXITY: Low   GOALS:   SHORT TERM GOALS: Target date: 10/08/2022   Pt will be independent and compliant with HEP for improved ROM, pain, and function.  Baseline:  Goal status:  GOAL MET 10/31/2022  2.  Pt will report at least a 25% improvement in cervical stiffness and mobility.  Baseline:  Goal status: GOAL MET 10/31/2022   LONG TERM GOALS: Target date: 12/26/2022  Pt's worst pain will be < 3/10 for improved daily mobility.  Baseline:  Goal status: GOAL MET 10/31/2022  2.  Pt will report she is able to turn her head without compensatory movement without significant difficulty for improved mobility with driving.  Baseline:  Goal status: IMPROVED BUT NOT MET  12/17  3.  Pt will demo improved cervical AROM to at least 30 deg in Sb'ing and 55 - 60 deg in rotation for improved cervical mobility with ADLs/IADLs. Baseline:  Goal status: 30% MET 12/17    PLAN:  PT FREQUENCY: 2 visits   PT DURATION:  1 week  PLANNED INTERVENTIONS: Therapeutic exercises, Therapeutic activity, Neuromuscular re-education, Patient/Family education, Self Care, Joint mobilization, Aquatic Therapy, Dry Needling, Electrical stimulation, Spinal mobilization, Cryotherapy, Moist heat, Taping, Traction, Ultrasound, Manual therapy, and Re-evaluation  PLAN FOR NEXT SESSION:  Pt will be discharged from PT due to good functional progress and progress toward goals.  Pt is agreeable with discharge and will cont with HEP.    PHYSICAL THERAPY DISCHARGE SUMMARY  Visits from Start of Care: 13  Current functional level related to goals / functional outcomes: See above   Remaining deficits: See above   Education / Equipment: HEP    Audie Clear III PT, DPT 01/02/23 10:07 AM

## 2023-01-02 ENCOUNTER — Encounter (HOSPITAL_BASED_OUTPATIENT_CLINIC_OR_DEPARTMENT_OTHER): Admitting: Physical Therapy

## 2023-01-02 ENCOUNTER — Encounter (HOSPITAL_BASED_OUTPATIENT_CLINIC_OR_DEPARTMENT_OTHER): Payer: Self-pay | Admitting: Physical Therapy

## 2023-01-09 ENCOUNTER — Other Ambulatory Visit (HOSPITAL_BASED_OUTPATIENT_CLINIC_OR_DEPARTMENT_OTHER): Payer: Self-pay | Admitting: *Deleted

## 2023-01-09 MED ORDER — MELOXICAM 7.5 MG PO TABS: 7.5000 mg | ORAL_TABLET | Freq: Every day | ORAL | 0 refills | Status: DC

## 2023-01-22 ENCOUNTER — Other Ambulatory Visit (HOSPITAL_BASED_OUTPATIENT_CLINIC_OR_DEPARTMENT_OTHER): Payer: Self-pay | Admitting: Family Medicine

## 2023-01-22 DIAGNOSIS — F988 Other specified behavioral and emotional disorders with onset usually occurring in childhood and adolescence: Secondary | ICD-10-CM

## 2023-01-22 MED ORDER — MELOXICAM 7.5 MG PO TABS: 7.5000 mg | ORAL_TABLET | Freq: Every day | ORAL | 0 refills | Status: DC

## 2023-01-22 MED ORDER — AMPHETAMINE-DEXTROAMPHET ER 30 MG PO CP24: 30.0000 mg | ORAL_CAPSULE | Freq: Every day | ORAL | 0 refills | Status: DC

## 2023-01-28 ENCOUNTER — Other Ambulatory Visit (HOSPITAL_BASED_OUTPATIENT_CLINIC_OR_DEPARTMENT_OTHER): Payer: Self-pay | Admitting: *Deleted

## 2023-01-28 DIAGNOSIS — R051 Acute cough: Secondary | ICD-10-CM

## 2023-01-28 MED ORDER — ALBUTEROL SULFATE HFA 108 (90 BASE) MCG/ACT IN AERS: 1.0000 | INHALATION_SPRAY | RESPIRATORY_TRACT | 2 refills | Status: DC | PRN

## 2023-02-12 ENCOUNTER — Encounter (HOSPITAL_BASED_OUTPATIENT_CLINIC_OR_DEPARTMENT_OTHER): Payer: Self-pay

## 2023-02-19 ENCOUNTER — Other Ambulatory Visit (HOSPITAL_BASED_OUTPATIENT_CLINIC_OR_DEPARTMENT_OTHER): Payer: Self-pay | Admitting: *Deleted

## 2023-02-19 MED ORDER — MELOXICAM 7.5 MG PO TABS: 7.5000 mg | ORAL_TABLET | Freq: Every day | ORAL | 0 refills | Status: DC

## 2023-02-20 ENCOUNTER — Encounter (HOSPITAL_BASED_OUTPATIENT_CLINIC_OR_DEPARTMENT_OTHER): Payer: Self-pay | Admitting: *Deleted

## 2023-02-27 ENCOUNTER — Other Ambulatory Visit (HOSPITAL_BASED_OUTPATIENT_CLINIC_OR_DEPARTMENT_OTHER): Payer: Self-pay | Admitting: Family Medicine

## 2023-02-27 DIAGNOSIS — F988 Other specified behavioral and emotional disorders with onset usually occurring in childhood and adolescence: Secondary | ICD-10-CM

## 2023-02-27 MED ORDER — AMPHETAMINE-DEXTROAMPHET ER 30 MG PO CP24: 30.0000 mg | ORAL_CAPSULE | Freq: Every day | ORAL | 0 refills | Status: DC

## 2023-03-25 ENCOUNTER — Encounter (HOSPITAL_BASED_OUTPATIENT_CLINIC_OR_DEPARTMENT_OTHER): Payer: Self-pay | Admitting: *Deleted

## 2023-03-26 ENCOUNTER — Other Ambulatory Visit (HOSPITAL_BASED_OUTPATIENT_CLINIC_OR_DEPARTMENT_OTHER): Payer: Self-pay | Admitting: *Deleted

## 2023-03-26 MED ORDER — MELOXICAM 7.5 MG PO TABS: 7.5000 mg | ORAL_TABLET | Freq: Every day | ORAL | 0 refills | Status: DC

## 2023-04-01 ENCOUNTER — Encounter (HOSPITAL_BASED_OUTPATIENT_CLINIC_OR_DEPARTMENT_OTHER): Payer: Self-pay | Admitting: *Deleted

## 2023-04-01 ENCOUNTER — Encounter (HOSPITAL_BASED_OUTPATIENT_CLINIC_OR_DEPARTMENT_OTHER): Payer: Self-pay | Admitting: Family Medicine

## 2023-04-01 ENCOUNTER — Ambulatory Visit (INDEPENDENT_AMBULATORY_CARE_PROVIDER_SITE_OTHER): Payer: PRIVATE HEALTH INSURANCE | Admitting: Family Medicine

## 2023-04-01 VITALS — BP 134/78 | HR 86 | Ht 64.0 in | Wt 144.3 lb

## 2023-04-01 DIAGNOSIS — F988 Other specified behavioral and emotional disorders with onset usually occurring in childhood and adolescence: Secondary | ICD-10-CM | POA: Diagnosis not present

## 2023-04-01 DIAGNOSIS — G4700 Insomnia, unspecified: Secondary | ICD-10-CM | POA: Insufficient documentation

## 2023-04-01 MED ORDER — AMPHETAMINE-DEXTROAMPHET ER 30 MG PO CP24: 30.0000 mg | ORAL_CAPSULE | Freq: Every day | ORAL | 0 refills | Status: DC

## 2023-04-01 NOTE — Progress Notes (Signed)
    Procedures performed today:    None.  Independent interpretation of notes and tests performed by another provider:   None.  Brief History, Exam, Impression, and Recommendations:    BP 134/78 (BP Location: Left Arm, Patient Position: Sitting, Cuff Size: Normal)   Pulse 86   Ht 5\' 4"  (1.626 m)   Wt 144 lb 4.8 oz (65.5 kg)   LMP  (LMP Unknown)   SpO2 98%   BMI 24.77 kg/m   Insomnia, unspecified type Assessment & Plan: Patient reports that recently she has been having difficulty with staying asleep.  She notes that about 4 nights out of the week she will find herself waking up after couple hours of sleep and having difficulty with falling back asleep.  She has tried OTC sleep aid, but has not noted any significant improvement with this.  Does not endorse any significant difficulty with falling asleep On further discussion, patient does report that she will watch TV before bed typically and oftentimes will watch TV while in bed.  She was recently away from home and was not watching TV before bed at that time and did find that she slept better then.  She has not currently active. We discussed general considerations.  Would recommend avoidance of screen time at least 1 to 2 hours before bedtime.  Recommend incorporating more regular physical activity into her week as this can also help with regulating sleep cycle.  We reviewed additional sleep hygiene recommendations, handout provided.  Can also consider cognitive behavioral therapy which can help with insomnia as well.   Attention deficit disorder (ADD) in adult Assessment & Plan: Patient reports that she is doing well currently.  Feels the current dose of medication is adequately controlling symptoms.  Denies any issues with sleep, appetite, chest pain or palpitations.  PDMP reviewed, no red flags, refill sent to pharmacy  Orders: -     Amphetamine-Dextroamphet ER; Take 1 capsule (30 mg total) by mouth daily.  Dispense: 30 capsule;  Refill: 0  Return in about 3 months (around 07/02/2023) for med check.   ___________________________________________ Susan Bleich de [place], MD, ABFM, [facility] Primary Care and Sports Medicine [facility]

## 2023-04-01 NOTE — Assessment & Plan Note (Signed)
Patient reports that she is doing well currently.  Feels the current dose of medication is adequately controlling symptoms.  Denies any issues with sleep, appetite, chest pain or palpitations.  PDMP reviewed, no red flags, refill sent to pharmacy

## 2023-04-01 NOTE — Assessment & Plan Note (Signed)
 Patient reports that recently she has been having difficulty with staying asleep.  She notes that about 4 nights out of the week she will find herself waking up after couple hours of sleep and having difficulty with falling back asleep.  She has tried OTC sleep aid, but has not noted any significant improvement with this.  Does not endorse any significant difficulty with falling asleep On further discussion, patient does report that she will watch TV before bed typically and oftentimes will watch TV while in bed.  She was recently away from home and was not watching TV before bed at that time and did find that she slept better then.  She has not currently active. We discussed general considerations.  Would recommend avoidance of screen time at least 1 to 2 hours before bedtime.  Recommend incorporating more regular physical activity into her week as this can also help with regulating sleep cycle.  We reviewed additional sleep hygiene recommendations, handout provided.  Can also consider cognitive behavioral therapy which can help with insomnia as well.

## 2023-04-01 NOTE — Patient Instructions (Signed)
  Medication Instructions:  Your physician recommends that you continue on your current medications as directed. Please refer to the Current Medication list given to you today. --If you need a refill on any your medications before your next appointment, please call your pharmacy first. If no refills are authorized on file call the office.-- Lab Work: Your physician has recommended that you have lab work today: no If you have labs (blood work) drawn today and your tests are completely normal, you will receive your results via MyChart message OR a phone call from our staff.  Please ensure you check your voicemail in the event that you authorized detailed messages to be left on a delegated number. If you have any lab test that is abnormal or we need to change your treatment, we will call you to review the results.  Referrals/Procedures/Imaging: no  Follow-Up: Your next appointment:   Your physician recommends that you schedule a follow-up appointment in: 3 months with Dr. de Peru  You will receive a text message or e-mail with a link to a survey about your care and experience with [place] today! We would greatly appreciate your feedback!   Thanks for letting us be apart of your health journey!!  Primary Care and Sports Medicine   Dr. Ceasar Mons [place]   We encourage you to activate your patient portal called "MyChart".  Sign up information is provided on this After Visit Summary.  MyChart is used to connect with patients for Virtual Visits (Telemedicine).  Patients are able to view lab/test results, encounter notes, upcoming appointments, etc.  Non-urgent messages can be sent to your provider as well. To learn more about what you can do with MyChart, please visit --  [email or web address].

## 2023-04-29 ENCOUNTER — Other Ambulatory Visit (HOSPITAL_BASED_OUTPATIENT_CLINIC_OR_DEPARTMENT_OTHER): Payer: Self-pay | Admitting: *Deleted

## 2023-04-29 MED ORDER — MELOXICAM 7.5 MG PO TABS: 7.5000 mg | ORAL_TABLET | Freq: Every day | ORAL | 0 refills | Status: DC

## 2023-05-07 ENCOUNTER — Other Ambulatory Visit (HOSPITAL_BASED_OUTPATIENT_CLINIC_OR_DEPARTMENT_OTHER): Payer: Self-pay | Admitting: Family Medicine

## 2023-05-07 DIAGNOSIS — F988 Other specified behavioral and emotional disorders with onset usually occurring in childhood and adolescence: Secondary | ICD-10-CM

## 2023-05-07 MED ORDER — AMPHETAMINE-DEXTROAMPHET ER 30 MG PO CP24
30.0000 mg | ORAL_CAPSULE | Freq: Every day | ORAL | 0 refills | Status: DC
Start: 2023-05-07 — End: 2023-06-19

## 2023-06-05 ENCOUNTER — Other Ambulatory Visit (HOSPITAL_BASED_OUTPATIENT_CLINIC_OR_DEPARTMENT_OTHER): Payer: Self-pay | Admitting: Family Medicine

## 2023-06-06 MED ORDER — MELOXICAM 7.5 MG PO TABS
7.5000 mg | ORAL_TABLET | Freq: Every day | ORAL | 0 refills | Status: DC
Start: 2023-06-06 — End: 2023-07-08

## 2023-06-19 ENCOUNTER — Encounter (HOSPITAL_BASED_OUTPATIENT_CLINIC_OR_DEPARTMENT_OTHER): Payer: Self-pay | Admitting: Family Medicine

## 2023-06-19 DIAGNOSIS — F988 Other specified behavioral and emotional disorders with onset usually occurring in childhood and adolescence: Secondary | ICD-10-CM

## 2023-06-19 MED ORDER — AMPHETAMINE-DEXTROAMPHET ER 30 MG PO CP24: 30.0000 mg | ORAL_CAPSULE | Freq: Every day | ORAL | 0 refills | Status: DC

## 2023-07-02 ENCOUNTER — Encounter (HOSPITAL_BASED_OUTPATIENT_CLINIC_OR_DEPARTMENT_OTHER): Payer: Self-pay | Admitting: Family Medicine

## 2023-07-02 ENCOUNTER — Ambulatory Visit (INDEPENDENT_AMBULATORY_CARE_PROVIDER_SITE_OTHER): Payer: PRIVATE HEALTH INSURANCE | Admitting: Family Medicine

## 2023-07-02 VITALS — BP 135/81 | HR 100 | Ht 64.5 in | Wt 144.0 lb

## 2023-07-02 DIAGNOSIS — G4700 Insomnia, unspecified: Secondary | ICD-10-CM | POA: Diagnosis not present

## 2023-07-02 DIAGNOSIS — Z0000 Encounter for general adult medical examination without abnormal findings: Secondary | ICD-10-CM

## 2023-07-02 DIAGNOSIS — F988 Other specified behavioral and emotional disorders with onset usually occurring in childhood and adolescence: Secondary | ICD-10-CM | POA: Diagnosis not present

## 2023-07-02 MED ORDER — DOXEPIN HCL 3 MG PO TABS: 1.0000 | ORAL_TABLET | Freq: Every evening | ORAL | 1 refills | Status: DC | PRN

## 2023-07-02 NOTE — Assessment & Plan Note (Signed)
 Patient reports continued issues with insomnia.  Continues to primarily have issues with sleep maintenance, finding himself waking up during the night.  She reports that she has made lifestyle modifications as discussed at last visit.  She has been decreasing screen time before bed as well as while in bed.  She does exercise, typically waking up around 5 AM to exercise in the morning.  She typically will take her Adderall around the same time.  She does have some increased stressors through work. We discussed options today.  Discussed recommendation for CBT, however patient declines this.  We discussed medication considerations and patient would be open to proceeding with this.  We will look to start initially with low-dose of doxepin.  Could consider increasing dose from 3 mg to 6 mg pending progress with medication.  Ultimately, if suboptimal response with medication, consideration would be given to Dora or zolpidem.  We will plan to follow-up on progress that next office visit or sooner as needed.

## 2023-07-02 NOTE — Assessment & Plan Note (Addendum)
 Patient reports that she is doing well currently.  Feels the current dose of medication is adequately controlling symptoms.  Denies any issues with sleep, appetite, chest pain or palpitations.  PDMP reviewed, no red flags, does not need refill at this time

## 2023-07-02 NOTE — Progress Notes (Signed)
    Procedures performed today:    None.  Independent interpretation of notes and tests performed by another provider:   None.  Brief History, Exam, Impression, and Recommendations:    BP 135/81 (BP Location: Right Arm, Patient Position: Sitting, Cuff Size: Normal)   Pulse 100   Ht 5' 4.5 (1.638 m)   Wt 144 lb (65.3 kg)   LMP  (LMP Unknown)   SpO2 98%   BMI 24.34 kg/m   Insomnia, unspecified type Assessment & Plan: Patient reports continued issues with insomnia.  Continues to primarily have issues with sleep maintenance, finding himself waking up during the night.  She reports that she has made lifestyle modifications as discussed at last visit.  She has been decreasing screen time before bed as well as while in bed.  She does exercise, typically waking up around 5 AM to exercise in the morning.  She typically will take her Adderall around the same time.  She does have some increased stressors through work. We discussed options today.  Discussed recommendation for CBT, however patient declines this.  We discussed medication considerations and patient would be open to proceeding with this.  We will look to start initially with low-dose of doxepin.  Could consider increasing dose from 3 mg to 6 mg pending progress with medication.  Ultimately, if suboptimal response with medication, consideration would be given to Dora or zolpidem.  We will plan to follow-up on progress that next office visit or sooner as needed.   Attention deficit disorder (ADD) in adult Assessment & Plan: Patient reports that she is doing well currently.  Feels the current dose of medication is adequately controlling symptoms.  Denies any issues with sleep, appetite, chest pain or palpitations.  PDMP reviewed, no red flags, does not need refill at this time   Wellness examination -     CBC with Differential/Platelet; Future -     Comprehensive metabolic panel with GFR; Future -     Hemoglobin A1c; Future -      Lipid panel; Future -     TSH Rfx on Abnormal to Free T4; Future  Other orders -     Doxepin HCl; Take 1 tablet (3 mg total) by mouth at bedtime as needed.  Dispense: 30 tablet; Refill: 1  Return in about 3 months (around 10/02/2023) for CPE with fasting labs 1 week prior.   ___________________________________________ Matheo Rathbone de [place], MD, ABFM, [facility] Primary Care and Sports Medicine [facility]

## 2023-07-02 NOTE — Patient Instructions (Signed)
   Medication Instructions:  Your physician recommends that you continue on your current medications as directed. Please refer to the Current Medication list given to you today. --If you need a refill on any your medications before your next appointment, please call your pharmacy first. If no refills are authorized on file call the office.-- Lab Work: Your physician has recommended that you have lab work today: 1 week before next visit  If you have labs (blood work) drawn today and your tests are completely normal, you will receive your results via MyChart message OR a phone call from our staff.  Please ensure you check your voicemail in the event that you authorized detailed messages to be left on a delegated number. If you have any lab test that is abnormal or we need to change your treatment, we will call you to review the results.   Follow-Up: Your next appointment:   Your physician recommends that you schedule a follow-up appointment in: 3 months physical with Dr. de Peru  You will receive a text message or e-mail with a link to a survey about your care and experience with [place] today! We would greatly appreciate your feedback!   Thanks for letting us be apart of your health journey!!  Primary Care and Sports Medicine   Dr. Ceasar Mons Peru   We encourage you to activate your patient portal called "MyChart".  Sign up information is provided on this After Visit Summary.  MyChart is used to connect with patients for Virtual Visits (Telemedicine).  Patients are able to view lab/test results, encounter notes, upcoming appointments, etc.  Non-urgent messages can be sent to your provider as well. To learn more about what you can do with MyChart, please visit --  [email or web address].

## 2023-07-08 ENCOUNTER — Other Ambulatory Visit (HOSPITAL_BASED_OUTPATIENT_CLINIC_OR_DEPARTMENT_OTHER): Payer: Self-pay | Admitting: *Deleted

## 2023-07-08 MED ORDER — MELOXICAM 7.5 MG PO TABS: 7.5000 mg | ORAL_TABLET | Freq: Every day | ORAL | 0 refills | Status: DC

## 2023-07-25 ENCOUNTER — Ambulatory Visit (HOSPITAL_BASED_OUTPATIENT_CLINIC_OR_DEPARTMENT_OTHER): Payer: PRIVATE HEALTH INSURANCE | Admitting: Radiology

## 2023-08-01 ENCOUNTER — Ambulatory Visit (HOSPITAL_BASED_OUTPATIENT_CLINIC_OR_DEPARTMENT_OTHER)
Admission: RE | Admit: 2023-08-01 | Discharge: 2023-08-01 | Disposition: A | Discharge status: Home / Self Care | Payer: Self-pay | Source: Ambulatory Visit | Attending: Family Medicine | Admitting: Family Medicine

## 2023-08-01 ENCOUNTER — Encounter (HOSPITAL_BASED_OUTPATIENT_CLINIC_OR_DEPARTMENT_OTHER): Payer: Self-pay | Admitting: Radiology

## 2023-08-01 DIAGNOSIS — Z1231 Encounter for screening mammogram for malignant neoplasm of breast: Secondary | ICD-10-CM | POA: Diagnosis present

## 2023-08-03 ENCOUNTER — Encounter (HOSPITAL_BASED_OUTPATIENT_CLINIC_OR_DEPARTMENT_OTHER): Payer: Self-pay | Admitting: Family Medicine

## 2023-08-04 ENCOUNTER — Other Ambulatory Visit (HOSPITAL_BASED_OUTPATIENT_CLINIC_OR_DEPARTMENT_OTHER): Payer: Self-pay | Admitting: Family Medicine

## 2023-08-04 DIAGNOSIS — F988 Other specified behavioral and emotional disorders with onset usually occurring in childhood and adolescence: Secondary | ICD-10-CM

## 2023-08-04 MED ORDER — AMPHETAMINE-DEXTROAMPHET ER 30 MG PO CP24: 30.0000 mg | ORAL_CAPSULE | Freq: Every day | ORAL | 0 refills | Status: DC

## 2023-08-04 NOTE — Telephone Encounter (Signed)
 Dr. De Peru pt. Thersia, Please see mychart message sent by pt and advise.

## 2023-08-07 ENCOUNTER — Other Ambulatory Visit (HOSPITAL_BASED_OUTPATIENT_CLINIC_OR_DEPARTMENT_OTHER): Payer: Self-pay | Admitting: *Deleted

## 2023-08-07 MED ORDER — MELOXICAM 7.5 MG PO TABS: 7.5000 mg | ORAL_TABLET | Freq: Every day | ORAL | 0 refills | Status: DC

## 2023-09-11 ENCOUNTER — Encounter (HOSPITAL_BASED_OUTPATIENT_CLINIC_OR_DEPARTMENT_OTHER): Payer: Self-pay

## 2023-09-16 ENCOUNTER — Encounter (HOSPITAL_BASED_OUTPATIENT_CLINIC_OR_DEPARTMENT_OTHER): Payer: Self-pay

## 2023-09-16 ENCOUNTER — Encounter (HOSPITAL_BASED_OUTPATIENT_CLINIC_OR_DEPARTMENT_OTHER): Payer: Self-pay | Admitting: Family Medicine

## 2023-09-16 DIAGNOSIS — F988 Other specified behavioral and emotional disorders with onset usually occurring in childhood and adolescence: Secondary | ICD-10-CM

## 2023-09-16 NOTE — Telephone Encounter (Signed)
 Please advise on refill request

## 2023-09-22 MED ORDER — DOXEPIN HCL 3 MG PO TABS: 1.0000 | ORAL_TABLET | Freq: Every evening | ORAL | 1 refills | Status: DC | PRN

## 2023-09-22 MED ORDER — AMPHETAMINE-DEXTROAMPHET ER 30 MG PO CP24: 30.0000 mg | ORAL_CAPSULE | Freq: Every day | ORAL | 0 refills | Status: DC

## 2023-09-30 ENCOUNTER — Emergency Department (HOSPITAL_BASED_OUTPATIENT_CLINIC_OR_DEPARTMENT_OTHER)
Admission: EM | Admit: 2023-09-30 | Discharge: 2023-09-30 | Disposition: A | Discharge status: Home / Self Care | Source: Ambulatory Visit

## 2023-09-30 ENCOUNTER — Ambulatory Visit: Payer: Self-pay

## 2023-09-30 ENCOUNTER — Other Ambulatory Visit: Payer: Self-pay

## 2023-09-30 ENCOUNTER — Telehealth: Payer: Self-pay

## 2023-09-30 ENCOUNTER — Encounter (HOSPITAL_BASED_OUTPATIENT_CLINIC_OR_DEPARTMENT_OTHER): Payer: Self-pay | Admitting: Emergency Medicine

## 2023-09-30 ENCOUNTER — Ambulatory Visit: Admitting: Family Medicine

## 2023-09-30 DIAGNOSIS — W010XXA Fall on same level from slipping, tripping and stumbling without subsequent striking against object, initial encounter: Secondary | ICD-10-CM | POA: Insufficient documentation

## 2023-09-30 DIAGNOSIS — S060X0A Concussion without loss of consciousness, initial encounter: Secondary | ICD-10-CM | POA: Insufficient documentation

## 2023-09-30 DIAGNOSIS — Y92481 Parking lot as the place of occurrence of the external cause: Secondary | ICD-10-CM | POA: Diagnosis not present

## 2023-09-30 DIAGNOSIS — S0990XA Unspecified injury of head, initial encounter: Secondary | ICD-10-CM | POA: Diagnosis present

## 2023-09-30 MED ORDER — KETOROLAC TROMETHAMINE 15 MG/ML IJ SOLN
15.0000 mg | Freq: Once | INTRAMUSCULAR | Status: AC
Start: 2023-09-30 — End: 2023-09-30
  Administered 2023-09-30: 15 mg via INTRAMUSCULAR
  Filled 2023-09-30: qty 1

## 2023-09-30 MED ORDER — ONDANSETRON 4 MG PO TBDP
4.0000 mg | ORAL_TABLET | Freq: Once | ORAL | Status: AC
  Administered 2023-09-30: 4 mg via ORAL
  Filled 2023-09-30: qty 1

## 2023-09-30 MED ORDER — ONDANSETRON HCL 4 MG PO TABS: 4.0000 mg | ORAL_TABLET | Freq: Three times a day (TID) | ORAL | 0 refills | Status: AC | PRN

## 2023-09-30 NOTE — Telephone Encounter (Signed)
**Note Shirley-Identified via Obfuscation**  FYI Only or Action Required?: Action required by provider: request for appointment.  Patient was last seen in primary care on 07/02/2023 by Shirley Lee, Shirley PARAS, MD.  Called Nurse Triage reporting Fall.  Symptoms began yesterday.  Interventions attempted: OTC medications: Tylenol .  Symptoms are: unchanged. Fell in parking lot yesterday and hit front of head on pavement, mild headache today, nausea.  Triage Disposition: See PCP When Office is Open (Within 3 Days)  Patient/caregiver understands and will follow disposition?: Yes  Copied from CRM #[id number]. Topic: Clinical - Red Word Triage >> Sep 30, 2023  8:05 AM Ivette P wrote: [facility] that prompted transfer to Nurse Triage: yesterday fell in parking lot and hit head. had headache and has head ache this morning and abdominal pain, unsure if related. Answer Assessment - Initial Assessment Questions 1. MECHANISM: How did the fall happen?     Tripped 2. DOMESTIC VIOLENCE AND ELDER ABUSE SCREENING: Did you fall because someone pushed you or tried to hurt you? If Yes, ask: Are you safe now?     no 3. ONSET: When did the fall happen? (e.g., minutes, hours, or days ago)     yesterday 4. LOCATION: What part of the body hit the ground? (e.g., back, buttocks, head, hips, knees, hands, head, stomach)     Hit front of head 5. INJURY: Did you hurt (injure) yourself when you fell? If Yes, ask: What did you injure? Tell me more about this? (e.g., body area; type of injury; pain severity)     headache 6. PAIN: Is there any pain? If Yes, ask: How bad is the pain? (e.g., Scale 0-10; or none, mild,      mild 7. SIZE: For cuts, bruises, or swelling, ask: How large is it? (e.g., inches or centimeters)      scrapes 8. PREGNANCY: Is there any chance you are pregnant? When was your last menstrual period?     no 9. OTHER SYMPTOMS: Do you have any other symptoms? (e.g., dizziness, fever, weakness; new-onset or worsening).       nausea 10. CAUSE: What do you think caused the fall (or falling)? (e.g., dizzy spell, tripped)       tripped  Protocols used: Falls and [facility]  Reason for Disposition  MILD weakness (e.g., does not interfere with ability to work, go to school, normal activities)  (Exception: Mild weakness is a chronic symptom.)  Answer Assessment - Initial Assessment Questions 1. MECHANISM: How did the fall happen?     Tripped 2. DOMESTIC VIOLENCE AND ELDER ABUSE SCREENING: Did you fall because someone pushed you or tried to hurt you? If Yes, ask: Are you safe now?     no 3. ONSET: When did the fall happen? (e.g., minutes, hours, or days ago)     yesterday 4. LOCATION: What part of the body hit the ground? (e.g., back, buttocks, head, hips, knees, hands, head, stomach)     Hit front of head 5. INJURY: Did you hurt (injure) yourself when you fell? If Yes, ask: What did you injure? Tell me more about this? (e.g., body area; type of injury; pain severity)     headache 6. PAIN: Is there any pain? If Yes, ask: How bad is the pain? (e.g., Scale 0-10; or none, mild,      mild 7. SIZE: For cuts, bruises, or swelling, ask: How large is it? (e.g., inches or centimeters)      scrapes 8. PREGNANCY: Is there any chance you are pregnant? When was  your last menstrual period?     no 9. OTHER SYMPTOMS: Do you have any other symptoms? (e.g., dizziness, fever, weakness; new-onset or worsening).      nausea 10. CAUSE: What do you think caused the fall (or falling)? (e.g., dizzy spell, tripped)       tripped  Protocols used: Falls and [facility]

## 2023-09-30 NOTE — ED Notes (Signed)
 DC paperwork given and verbally understood.

## 2023-09-30 NOTE — Progress Notes (Deleted)
   Acute Office Visit  Subjective:     Patient ID: Shirley Lee, female    DOB: [birth year], 63 y.o.   MRN: [id number]  No chief complaint on file.   HPI  Discussed the use of AI scribe software for clinical note transcription with the patient, who gave verbal consent to proceed.  History of Present Illness      ROS Per HPI      Objective:    LMP  (LMP Unknown)    Physical Exam Vitals and nursing note reviewed.  Constitutional:      General: She is not in acute distress.    Appearance: Normal appearance. She is normal weight.  HENT:     Head: Normocephalic and atraumatic.     Right Ear: External ear normal.     Left Ear: External ear normal.     Nose: Nose normal.     Mouth/Throat:     Mouth: Mucous membranes are moist.     Pharynx: Oropharynx is clear.  Eyes:     Extraocular Movements: Extraocular movements intact.     Pupils: Pupils are equal, round, and reactive to light.  Cardiovascular:     Rate and Rhythm: Normal rate and regular rhythm.     Pulses: Normal pulses.     Heart sounds: Normal heart sounds.  Pulmonary:     Effort: Pulmonary effort is normal. No respiratory distress.     Breath sounds: Normal breath sounds. No wheezing, rhonchi or rales.  Musculoskeletal:        General: Normal range of motion.     Cervical back: Normal range of motion.     Right lower leg: No edema.     Left lower leg: No edema.  Lymphadenopathy:     Cervical: No cervical adenopathy.  Neurological:     General: No focal deficit present.     Mental Status: She is alert and oriented to person, place, and time.  Psychiatric:        Mood and Affect: Mood normal.        Thought Content: Thought content normal.     No results found for any visits on 09/30/23.      Assessment & Plan:   Assessment and Plan Assessment & Plan      No orders of the defined types were placed in this encounter.    No orders of the defined types were placed in this  encounter.   No follow-ups on file.  Corean LITTIE Ku, FNP

## 2023-09-30 NOTE — Telephone Encounter (Signed)
 Patient had an appointment scheduled today for a fall yesterday. She hit her head, and has new symptoms of nausea this morning. Per Corean Ku, whom patient was scheduled with today patient should be seen in the ED. Spoke with patient, gave her this information. Patient gave a verbal understanding. Will cancel appointment.

## 2023-09-30 NOTE — Discharge Instructions (Addendum)
 Use your Zofran  as needed for nausea and vomiting.  You can take Tylenol  and Motrin as needed for pain.

## 2023-09-30 NOTE — Telephone Encounter (Signed)
 Pt is requesting that order for CT be placed so  that pt will not have to wait all day at [facility] to have scan completed.

## 2023-09-30 NOTE — ED Provider Notes (Signed)
 [place] EMERGENCY DEPARTMENT AT [facility] Provider Note   CSN: [id number] Arrival date & time: 09/30/23  9143     Patient presents with: Shirley Lee is a 63 y.o. female.   63 year old female presents for evaluation of headache after a fall.  States she tripped in the parking lot yesterday fell and hit her left forehead.  States she woke up today with a headache and nausea.  She called her primary care doctor's office and they told her to come to the ER.  Patient is otherwise feeling well.  She is not on blood thinners did not lose consciousness.   Fall Associated symptoms include headaches. Pertinent negatives include no chest pain, no abdominal pain and no shortness of breath.       Prior to Admission medications   Medication Sig Start Date End Date Taking? Authorizing Provider  ondansetron  (ZOFRAN ) 4 MG tablet Take 1 tablet (4 mg total) by mouth every 8 (eight) hours as needed for up to 4 days. 09/30/23 10/04/23 Yes Kyisha Fowle L, DO  albuterol  (VENTOLIN  HFA) 108 (90 Base) MCG/ACT inhaler Inhale 1-2 puffs into the lungs every 4 (four) hours as needed for wheezing or shortness of breath. 01/28/23   de [place], Raymond J, MD  amphetamine -dextroamphetamine (ADDERALL XR) 30 MG 24 hr capsule Take 1 capsule (30 mg total) by mouth daily. 09/22/23   de [place], Quintin PARAS, MD  cyclobenzaprine  (FLEXERIL ) 5 MG tablet Take 1 tablet (5 mg total) by mouth 3 (three) times daily as needed for muscle spasms. Patient not taking: Reported on 07/02/2023 10/30/22   de [place], Quintin PARAS, MD  Doxepin  HCl 3 MG TABS Take 1 tablet (3 mg total) by mouth at bedtime as needed. 09/22/23   de [place], Quintin PARAS, MD  meloxicam  (MOBIC ) 7.5 MG tablet Take 1 tablet (7.5 mg total) by mouth daily. 08/07/23   de [place], Raymond J, MD    Allergies: Penicillins    Review of Systems  Constitutional:  Negative for chills and fever.  HENT:  Negative for ear pain and sore throat.   Eyes:  Negative for pain  and visual disturbance.  Respiratory:  Negative for cough and shortness of breath.   Cardiovascular:  Negative for chest pain and palpitations.  Gastrointestinal:  Positive for nausea. Negative for abdominal pain and vomiting.  Genitourinary:  Negative for dysuria and hematuria.  Musculoskeletal:  Negative for arthralgias and back pain.  Skin:  Negative for color change and rash.  Neurological:  Positive for headaches. Negative for seizures and syncope.  All other systems reviewed and are negative.   Updated Vital Signs BP 120/71 (BP Location: Right Arm)   Pulse 93   Temp 97.7 F (36.5 C) (Oral)   Resp 16   LMP  (LMP Unknown)   SpO2 98%   Physical Exam Vitals and nursing note reviewed.  Constitutional:      General: She is not in acute distress.    Appearance: Normal appearance. She is well-developed. She is not ill-appearing.  HENT:     Head: Normocephalic and atraumatic.  Eyes:     Conjunctiva/sclera: Conjunctivae normal.  Cardiovascular:     Rate and Rhythm: Normal rate and regular rhythm.     Heart sounds: No murmur heard. Pulmonary:     Effort: Pulmonary effort is normal. No respiratory distress.     Breath sounds: Normal breath sounds.  Abdominal:     Palpations: Abdomen is soft.     Tenderness:  There is no abdominal tenderness.  Musculoskeletal:        General: No swelling.     Cervical back: Neck supple.  Skin:    General: Skin is warm and dry.     Capillary Refill: Capillary refill takes less than 2 seconds.  Neurological:     General: No focal deficit present.     Mental Status: She is alert.  Psychiatric:        Mood and Affect: Mood normal.     (all labs ordered are listed, but only abnormal results are displayed) Labs Reviewed - No data to display  EKG: None  Radiology: No results found.   Procedures   Medications Ordered in the ED  ketorolac  (TORADOL ) 15 MG/ML injection 15 mg (has no administration in time range)  ondansetron   (ZOFRAN -ODT) disintegrating tablet 4 mg (has no administration in time range)                                    Medical Decision Making Patient here for likely mild concussion after she had a fall yesterday.  Has some abrasion to her left forehead but is otherwise feeling pretty well with stable vitals no neurologic deficits.  Will give her Zofran  and Toradol  here.  Advised Tylenol  Motrin as needed for pain, and I will give her a prescription for Zofran  to use as needed.  Advise return for new or worsening symptoms.  She feels comfortable being discharged home.  Problems Addressed: Concussion without loss of consciousness, initial encounter: acute illness or injury  Amount and/or Complexity of Data Reviewed External Data Reviewed: notes.    Details: No prior ER records for review  Risk OTC drugs. Prescription drug management.     Final diagnoses:  Concussion without loss of consciousness, initial encounter    ED Discharge Orders          Ordered    ondansetron  (ZOFRAN ) 4 MG tablet  Every 8 hours PRN        09/30/23 0943               Jacquise Rarick L, DO 09/30/23 0945

## 2023-09-30 NOTE — ED Triage Notes (Signed)
 Mechanical fall yesterday in parking lot. States hit head. Denies LOC. No thinners. Endorses headache and nausea.

## 2023-10-01 ENCOUNTER — Telehealth (HOSPITAL_BASED_OUTPATIENT_CLINIC_OR_DEPARTMENT_OTHER): Payer: Self-pay | Admitting: Family Medicine

## 2023-10-01 NOTE — Telephone Encounter (Signed)
 Scheduled patient for a follow up for 9/26 patient states she is still having a headache and wanted to let us  know.  Patient is taking tylenol . Please advise

## 2023-10-10 ENCOUNTER — Encounter (HOSPITAL_BASED_OUTPATIENT_CLINIC_OR_DEPARTMENT_OTHER): Payer: Self-pay | Admitting: Family Medicine

## 2023-10-10 ENCOUNTER — Ambulatory Visit (INDEPENDENT_AMBULATORY_CARE_PROVIDER_SITE_OTHER): Payer: PRIVATE HEALTH INSURANCE | Admitting: Family Medicine

## 2023-10-10 VITALS — BP 121/70 | HR 104 | Ht 64.5 in | Wt 144.8 lb

## 2023-10-10 DIAGNOSIS — Z23 Encounter for immunization: Secondary | ICD-10-CM | POA: Diagnosis not present

## 2023-10-10 DIAGNOSIS — S060X0A Concussion without loss of consciousness, initial encounter: Secondary | ICD-10-CM

## 2023-10-10 DIAGNOSIS — S060XAA Concussion with loss of consciousness status unknown, initial encounter: Secondary | ICD-10-CM | POA: Insufficient documentation

## 2023-10-10 NOTE — Assessment & Plan Note (Signed)
 Discussed pathophysiology, treatment, expected time course related to concussion Based on history and exam, do suspect that patient sustained concussion Discussed management moving forward, particularly related to cognitive and physical rest Discussed accommodations to utilize at work.  She indicates that work has been working with her in this regard and does not require note today Discussed recommendations for engaging in light physical activity Ensure adequate sleep at night, hydration Plan for follow-up at previously scheduled appointment to monitor progress or sooner as needed

## 2023-10-10 NOTE — Progress Notes (Signed)
    Procedures performed today:    None.  Independent interpretation of notes and tests performed by another provider:   None.  Brief History, Exam, Impression, and Recommendations:    BP 121/70 (BP Location: Left Arm, Patient Position: Sitting, Cuff Size: Normal)   Pulse (!) 104   Ht 5' 4.5 (1.638 m)   Wt 144 lb 12.8 oz (65.7 kg)   LMP  (LMP Unknown)   SpO2 100%   BMI 24.47 kg/m   Shirley Lee is a 63 y.o. female presenting to clinic for evaluation of recent head injury. DOI: about 2 weeks Mechanism: fell in parking lot, hit head Prior head injury: No Symptoms: headache that night, next morning a lot of nausea. Still with dull headache. Returned to work: Yes - keeps light in her office off.  Aggravating factors: photophobia and phonophobia. Worse with screens Sleep affected: at baseline She overall feels about 70% improved/back to baseline. She has been taking Tylenol  for the headaches.  Concussion without loss of consciousness, initial encounter Assessment & Plan: Discussed pathophysiology, treatment, expected time course related to concussion Based on history and exam, do suspect that patient sustained concussion Discussed management moving forward, particularly related to cognitive and physical rest Discussed accommodations to utilize at work.  She indicates that work has been working with her in this regard and does not require note today Discussed recommendations for engaging in light physical activity Ensure adequate sleep at night, hydration Plan for follow-up at previously scheduled appointment to monitor progress or sooner as needed   Encounter for immunization -     Flu vaccine trivalent PF, 6mos and older(Flulaval,Afluria,Fluarix,Fluzone)  Return if symptoms worsen or fail to improve.   ___________________________________________ Shirley Ingwersen de Peru, MD, ABFM, CAQSM Primary Care and Sports Medicine [facility]

## 2023-10-10 NOTE — Patient Instructions (Signed)
  Medication Instructions:  Your physician recommends that you continue on your current medications as directed. Please refer to the Current Medication list given to you today. --If you need a refill on any your medications before your next appointment, please call your pharmacy first. If no refills are authorized on file call the office.-- Lab Work: Your physician has recommended that you have lab work today: 1 week before next visit  If you have labs (blood work) drawn today and your tests are completely normal, you will receive your results via MyChart message OR a phone call from our staff.  Please ensure you check your voicemail in the event that you authorized detailed messages to be left on a delegated number. If you have any lab test that is abnormal or we need to change your treatment, we will call you to review the results.   Follow-Up: Your next appointment:   Your physician recommends that you schedule a follow-up appointment in: keep appointment coming up  with Dr. de Peru  You will receive a text message or e-mail with a link to a survey about your care and experience with us  today! We would greatly appreciate your feedback!   Thanks for letting us  be apart of your health journey!!  Primary Care and Sports Medicine   Dr. Quintin sheerer Peru   We encourage you to activate your patient portal called MyChart.  Sign up information is provided on this After Visit Summary.  MyChart is used to connect with patients for Virtual Visits (Telemedicine).  Patients are able to view lab/test results, encounter notes, upcoming appointments, etc.  Non-urgent messages can be sent to your provider as well. To learn more about what you can do with MyChart, please visit --  [email or web address].

## 2023-10-31 ENCOUNTER — Encounter (HOSPITAL_BASED_OUTPATIENT_CLINIC_OR_DEPARTMENT_OTHER): Admitting: Family Medicine

## 2023-11-03 ENCOUNTER — Other Ambulatory Visit (HOSPITAL_BASED_OUTPATIENT_CLINIC_OR_DEPARTMENT_OTHER): Payer: Self-pay | Admitting: *Deleted

## 2023-11-03 ENCOUNTER — Other Ambulatory Visit (HOSPITAL_BASED_OUTPATIENT_CLINIC_OR_DEPARTMENT_OTHER): Payer: Self-pay | Admitting: Family Medicine

## 2023-11-03 DIAGNOSIS — Z0000 Encounter for general adult medical examination without abnormal findings: Secondary | ICD-10-CM

## 2023-11-03 LAB — LIPID PANEL
Chol/HDL Ratio: 3.8 ratio (ref 0.0–4.4)
Cholesterol, Total: 204 mg/dL — ABNORMAL HIGH (ref 100–199)
HDL: 54 mg/dL (ref >39)
LDL Chol Calc (NIH): 135 mg/dL — ABNORMAL HIGH (ref 0–99)
Triglycerides: 86 mg/dL (ref 0–149)
VLDL Cholesterol Cal: 15 mg/dL (ref 5–40)

## 2023-11-03 LAB — CBC WITH DIFFERENTIAL/PLATELET
Basophils Absolute: 0 x10E3/uL (ref 0.0–0.2)
Basos: 0 %
EOS (ABSOLUTE): 0.1 x10E3/uL (ref 0.0–0.4)
Eos: 2 %
Hematocrit: 43.4 % (ref 34.0–46.6)
Hemoglobin: 14.4 g/dL (ref 11.1–15.9)
Immature Grans (Abs): 0 x10E3/uL (ref 0.0–0.1)
Immature Granulocytes: 0 %
Lymphocytes Absolute: 1.8 x10E3/uL (ref 0.7–3.1)
Lymphs: 26 %
MCH: 31.6 pg (ref 26.6–33.0)
MCHC: 33.2 g/dL (ref 31.5–35.7)
MCV: 95 fL (ref 79–97)
Monocytes Absolute: 0.5 x10E3/uL (ref 0.1–0.9)
Monocytes: 8 %
Neutrophils Absolute: 4.4 x10E3/uL (ref 1.4–7.0)
Neutrophils: 64 %
Platelets: 267 x10E3/uL (ref 150–450)
RBC: 4.56 x10E6/uL (ref 3.77–5.28)
RDW: 12.9 % (ref 11.7–15.4)
WBC: 6.9 x10E3/uL (ref 3.4–10.8)

## 2023-11-03 LAB — COMPREHENSIVE METABOLIC PANEL WITH GFR
ALT: 24 IU/L (ref 0–32)
AST: 22 IU/L (ref 0–40)
Albumin: 4.3 g/dL (ref 3.9–4.9)
Alkaline Phosphatase: 69 IU/L (ref 49–135)
BUN/Creatinine Ratio: 19 (ref 12–28)
BUN: 15 mg/dL (ref 8–27)
Bilirubin Total: 0.4 mg/dL (ref 0.0–1.2)
CO2: 23 mmol/L (ref 20–29)
Calcium: 9.5 mg/dL (ref 8.7–10.3)
Chloride: 103 mmol/L (ref 96–106)
Creatinine, Ser: 0.78 mg/dL (ref 0.57–1.00)
Globulin, Total: 2.2 g/dL (ref 1.5–4.5)
Glucose: 101 mg/dL — ABNORMAL HIGH (ref 70–99)
Potassium: 4.2 mmol/L (ref 3.5–5.2)
Sodium: 139 mmol/L (ref 134–144)
Total Protein: 6.5 g/dL (ref 6.0–8.5)
eGFR: 85 mL/min/1.73 (ref >59)

## 2023-11-03 LAB — TSH RFX ON ABNORMAL TO FREE T4: TSH: 0.836 u[IU]/mL (ref 0.450–4.500)

## 2023-11-03 LAB — HEMOGLOBIN A1C
Est. average glucose Bld gHb Est-mCnc: 108 mg/dL
Hgb A1c MFr Bld: 5.4 % (ref 4.8–5.6)

## 2023-11-04 ENCOUNTER — Ambulatory Visit (HOSPITAL_BASED_OUTPATIENT_CLINIC_OR_DEPARTMENT_OTHER): Payer: Self-pay | Admitting: Family Medicine

## 2023-11-04 MED ORDER — MELOXICAM 7.5 MG PO TABS: 7.5000 mg | ORAL_TABLET | Freq: Every day | ORAL | 0 refills | Status: DC

## 2023-11-06 ENCOUNTER — Other Ambulatory Visit (HOSPITAL_BASED_OUTPATIENT_CLINIC_OR_DEPARTMENT_OTHER): Payer: Self-pay

## 2023-11-06 ENCOUNTER — Ambulatory Visit (INDEPENDENT_AMBULATORY_CARE_PROVIDER_SITE_OTHER): Payer: PRIVATE HEALTH INSURANCE | Admitting: Family Medicine

## 2023-11-06 VITALS — BP 120/77 | HR 105 | Temp 98.0°F | Resp 16 | Ht 64.5 in | Wt 144.6 lb

## 2023-11-06 DIAGNOSIS — F988 Other specified behavioral and emotional disorders with onset usually occurring in childhood and adolescence: Secondary | ICD-10-CM

## 2023-11-06 DIAGNOSIS — Z0000 Encounter for general adult medical examination without abnormal findings: Secondary | ICD-10-CM | POA: Insufficient documentation

## 2023-11-06 DIAGNOSIS — Z23 Encounter for immunization: Secondary | ICD-10-CM

## 2023-11-06 MED ORDER — AMPHETAMINE-DEXTROAMPHET ER 30 MG PO CP24: 30.0000 mg | ORAL_CAPSULE | Freq: Every day | ORAL | 0 refills | Status: DC

## 2023-11-06 NOTE — Assessment & Plan Note (Signed)
 Routine HCM labs reviewed. HCM reviewed/discussed. Anticipatory guidance regarding healthy weight, lifestyle and choices given. Recommend healthy diet.  Recommend approximately 150 minutes/week of moderate intensity exercise Recommend regular dental and vision exams Always use seatbelt/lap and shoulder restraints Recommend using smoke alarms and checking batteries at least twice a year Recommend using sunscreen when outside Discussed colon cancer screening recommendations, options.  Patient will consider and let us  know how he would like to proceed Discussed recommendations for shingles vaccine.  Patient will proceed with this at pharmacy downstairs today. Discussed immunization recommendations

## 2023-11-06 NOTE — Patient Instructions (Addendum)
  Medication Instructions:  Your physician recommends that you continue on your current medications as directed. Please refer to the Current Medication list given to you today. --If you need a refill on any your medications before your next appointment, please call your pharmacy first. If no refills are authorized on file call the office.-- Lab Work: Your physician has recommended that you have lab work today: no If you have labs (blood work) drawn today and your tests are completely normal, you will receive your results via MyChart message OR a phone call from our staff.  Please ensure you check your voicemail in the event that you authorized detailed messages to be left on a delegated number. If you have any lab test that is abnormal or we need to change your treatment, we will call you to review the results.  Referrals/Procedures/Imaging: no  Follow-Up: Your next appointment:   Your physician recommends that you schedule a follow-up appointment in: 3 months with Dr. de Peru  You will receive a text message or e-mail with a link to a survey about your care and experience with [place] today! We would greatly appreciate your feedback!   Thanks for letting us be apart of your health journey!!  Primary Care and Sports Medicine   Dr. Ceasar Mons Peru   We encourage you to activate your patient portal called "MyChart".  Sign up information is provided on this After Visit Summary.  MyChart is used to connect with patients for Virtual Visits (Telemedicine).  Patients are able to view lab/test results, encounter notes, upcoming appointments, etc.  Non-urgent messages can be sent to your provider as well. To learn more about what you can do with MyChart, please visit --  [email or web address].

## 2023-11-06 NOTE — Progress Notes (Signed)
 Subjective:    CC: Annual Physical Exam  HPI:  Shirley Lee is a 63 y.o. presenting for annual physical  I reviewed the past medical history, family history, social history, surgical history, and allergies today and no changes were needed.  Please see the problem list section below in epic for further details.  Past Medical History: Past Medical History:  Diagnosis Date   Adult ADHD    Chicken pox    History of colon polyps    History of pneumonia 01/17/2022   Lisfranc's sprain, left, subsequent encounter 01/25/2019   Past Surgical History: Past Surgical History:  Procedure Laterality Date   ABDOMINAL HYSTERECTOMY  1989   Social History: Social History   Socioeconomic History   Marital status: Married    Spouse name: Not on file   Number of children: Not on file   Years of education: Not on file   Highest education level: Not on file  Occupational History   Not on file  Tobacco Use   Smoking status: Never    Passive exposure: Never   Smokeless tobacco: Never  Vaping Use   Vaping status: Never Used  Substance and Sexual Activity   Alcohol use: Yes    Alcohol/week: 3.0 standard drinks of alcohol    Types: 3 Glasses of wine per week   Drug use: Never   Sexual activity: Yes    Birth control/protection: Surgical  Other Topics Concern   Not on file  Social History Narrative   Not on file   Social Drivers of Health   Financial Resource Strain: Low Risk  (10/30/2022)   Overall Financial Resource Strain (CARDIA)    Difficulty of Paying Living Expenses: Not hard at all  Food Insecurity: No Food Insecurity (10/30/2022)   Hunger Vital Sign    Worried About Running Out of Food in the Last Year: Never true    Ran Out of Food in the Last Year: Never true  Transportation Needs: No Transportation Needs (10/30/2022)   PRAPARE - Administrator, Civil Service (Medical): No    Lack of Transportation (Non-Medical): No  Physical Activity: Sufficiently  Active (10/30/2022)   Exercise Vital Sign    Days of Exercise per Week: 6 days    Minutes of Exercise per Session: 40 min  Stress: No Stress Concern Present (10/30/2022)   [employer] of Occupational Health - Occupational Stress Questionnaire    Feeling of Stress : Not at all  Social Connections: Moderately Integrated (10/30/2022)   Social Connection and Isolation Panel    Frequency of Communication with Friends and Family: More than three times a week    Frequency of Social Gatherings with Friends and Family: More than three times a week    Attends Religious Services: Never    Database administrator or Organizations: Yes    Attends Engineer, structural: More than 4 times per year    Marital Status: Married   Family History: Family History  Problem Relation Age of Onset   Multiple sclerosis Sister    Allergies: Allergies  Allergen Reactions   Penicillins Other (See Comments)    Childhood allergy unknown of sypmtoms   Medications: See med rec.  Review of Systems: No headache, visual changes, nausea, vomiting, diarrhea, constipation, dizziness, abdominal pain, skin rash, fevers, chills, night sweats, swollen lymph nodes, weight loss, chest pain, body aches, joint swelling, muscle aches, shortness of breath, mood changes, visual or auditory hallucinations.  Objective:    BP  120/77 (Cuff Size: Normal)   Pulse (!) 105   Temp 98 F (36.7 C) (Oral)   Resp 16   Ht 5' 4.5 (1.638 m)   Wt 144 lb 9.6 oz (65.6 kg)   LMP  (LMP Unknown)   SpO2 96%   BMI 24.44 kg/m   General: Well Developed, well nourished, and in no acute distress. Neuro: Alert and oriented x3, extra-ocular muscles intact, sensation grossly intact. Cranial nerves II through XII are intact, motor, sensory, and coordinative functions are all intact. HEENT: Normocephalic, atraumatic, pupils equal round reactive to light, neck supple, no masses, no lymphadenopathy, thyroid  nonpalpable. Oropharynx,  nasopharynx, external ear canals are unremarkable. Skin: Warm and dry, no rashes noted. Cardiac: Regular rate and rhythm, no murmurs rubs or gallops. Respiratory: Clear to auscultation bilaterally. Not using accessory muscles, speaking in full sentences. Abdominal: Soft, nontender, nondistended, positive bowel sounds, no masses, no organomegaly. Musculoskeletal: Shoulder, elbow, wrist, hip, knee, ankle stable, and with full range of motion.  Impression and Recommendations:    Wellness examination Assessment & Plan: Routine HCM labs reviewed. HCM reviewed/discussed. Anticipatory guidance regarding healthy weight, lifestyle and choices given. Recommend healthy diet.  Recommend approximately 150 minutes/week of moderate intensity exercise Recommend regular dental and vision exams Always use seatbelt/lap and shoulder restraints Recommend using smoke alarms and checking batteries at least twice a year Recommend using sunscreen when outside Discussed colon cancer screening recommendations, options.  Patient will consider and let us  know how he would like to proceed Discussed recommendations for shingles vaccine.  Patient will proceed with this at pharmacy downstairs today. Discussed immunization recommendations   Attention deficit disorder (ADD) in adult -     Amphetamine -Dextroamphet ER; Take 1 capsule (30 mg total) by mouth daily.  Dispense: 30 capsule; Refill: 0  Return in about 3 months (around 02/06/2024) for med check.   ___________________________________________ Shamarr Faucett de [place], MD, ABFM, CAQSM Primary Care [facility] Sports [facility]

## 2023-11-06 NOTE — Progress Notes (Signed)
 Patient is in office today for a office visit for physical.  PNA 21-valent conjugate vaccine was provided today. Patient Injection was given in the  Left deltoid. Patient tolerated injection well.

## 2023-11-12 ENCOUNTER — Encounter (HOSPITAL_BASED_OUTPATIENT_CLINIC_OR_DEPARTMENT_OTHER): Payer: PRIVATE HEALTH INSURANCE | Admitting: Family Medicine

## 2023-12-08 ENCOUNTER — Other Ambulatory Visit (HOSPITAL_BASED_OUTPATIENT_CLINIC_OR_DEPARTMENT_OTHER): Payer: Self-pay | Admitting: *Deleted

## 2023-12-08 MED ORDER — MELOXICAM 7.5 MG PO TABS: 7.5000 mg | ORAL_TABLET | Freq: Every day | ORAL | 0 refills | Status: DC

## 2023-12-18 ENCOUNTER — Encounter (HOSPITAL_BASED_OUTPATIENT_CLINIC_OR_DEPARTMENT_OTHER): Payer: Self-pay | Admitting: Family Medicine

## 2023-12-18 DIAGNOSIS — F988 Other specified behavioral and emotional disorders with onset usually occurring in childhood and adolescence: Secondary | ICD-10-CM

## 2023-12-18 NOTE — Telephone Encounter (Signed)
 Please see mychart message sent by pt and advise.

## 2023-12-21 MED ORDER — AMPHETAMINE-DEXTROAMPHET ER 30 MG PO CP24: 30.0000 mg | ORAL_CAPSULE | Freq: Every day | ORAL | 0 refills | Status: DC

## 2024-01-13 ENCOUNTER — Other Ambulatory Visit (HOSPITAL_BASED_OUTPATIENT_CLINIC_OR_DEPARTMENT_OTHER): Payer: Self-pay

## 2024-01-18 ENCOUNTER — Ambulatory Visit (HOSPITAL_COMMUNITY)
Admission: EM | Admit: 2024-01-18 | Discharge: 2024-01-18 | Disposition: A | Discharge status: Home / Self Care | Payer: PRIVATE HEALTH INSURANCE

## 2024-01-18 ENCOUNTER — Encounter (HOSPITAL_COMMUNITY): Payer: Self-pay

## 2024-01-18 DIAGNOSIS — J069 Acute upper respiratory infection, unspecified: Secondary | ICD-10-CM | POA: Diagnosis not present

## 2024-01-18 LAB — POCT INFLUENZA A/B
Influenza A, POC: NEGATIVE
Influenza B, POC: NEGATIVE

## 2024-01-18 MED ORDER — PREDNISONE 20 MG PO TABS: 40.0000 mg | ORAL_TABLET | Freq: Every day | ORAL | 0 refills | Status: AC

## 2024-01-18 MED ORDER — AZELASTINE HCL 0.1 % NA SOLN: 1.0000 | Freq: Two times a day (BID) | NASAL | 1 refills | Status: DC

## 2024-01-18 NOTE — Discharge Instructions (Signed)
" °  1. Viral upper respiratory tract infection (Primary) - POC Influenza A/B completed in UC is negative for influenza A and influenza B. - azelastine  (ASTELIN ) 0.1 % nasal spray; Place 1 spray into both nostrils 2 (two) times daily. Use in each nostril as directed  Dispense: 30 mL; Refill: 1 - predniSONE  (DELTASONE ) 20 MG tablet; Take 2 tablets (40 mg total) by mouth daily for 5 days.  Dispense: 10 tablet; Refill: 0  -Continue to monitor symptoms for any change in severity if there is any escalation of current symptoms or development of new symptoms follow-up in ER for further evaluation and management. "

## 2024-01-18 NOTE — ED Triage Notes (Signed)
 Cough and congestion started 2 days ago. Patient has been using his Albuterol  inhaler.

## 2024-01-18 NOTE — ED Provider Notes (Signed)
 " [facility] [place]  Note:  This document was prepared using Dragon voice recognition software and may include unintentional dictation errors.  MRN: [id number] DOB: 10-May-1960  Subjective:   Shirley Lee is a 64 y.o. female presenting for cough, nasal congestion, chest congestion, mild wheezing x 2 days.  Patient reports past history of asthma, has been using albuterol  inhaler with minimal improvement.  Patient states that she often gets cold and congestion symptoms this time of year and wants to make sure that symptoms do not worsen and/or develops pneumonia.  Patient states that she will be traveling tomorrow and wants to make sure that she is not sick prior to travel.  No shortness of breath, chest pain, weakness, dizziness.  Current Medications[1]   Allergies[2]  Past Medical History:  Diagnosis Date   Adult ADHD    Chicken pox    History of colon polyps    History of pneumonia 01/17/2022   Lisfranc's sprain, left, subsequent encounter 01/25/2019     Past Surgical History:  Procedure Laterality Date   ABDOMINAL HYSTERECTOMY  1989    Family History  Problem Relation Age of Onset   Multiple sclerosis Sister     Social History[3]  ROS Refer to HPI for ROS details.  Objective:    Vitals: BP (!) 153/87 (BP Location: Left Arm)   Pulse (!) 109   Temp 98.5 F (36.9 C) (Oral)   Resp 18   LMP  (LMP Unknown)   SpO2 98%   Physical Exam Vitals and nursing note reviewed.  Constitutional:      General: She is not in acute distress.    Appearance: Normal appearance. She is well-developed. She is not ill-appearing or toxic-appearing.  HENT:     Head: Normocephalic and atraumatic.     Nose: Congestion present.     Mouth/Throat:     Mouth: Mucous membranes are moist.     Pharynx: Oropharynx is clear.  Cardiovascular:     Rate and Rhythm: Normal rate and regular rhythm.     Heart sounds: Normal heart sounds. No murmur heard. Pulmonary:     Effort:  Pulmonary effort is normal. No respiratory distress.     Breath sounds: Normal breath sounds. No stridor. No wheezing, rhonchi or rales.  Chest:     Chest wall: No tenderness.  Skin:    General: Skin is warm and dry.  Neurological:     General: No focal deficit present.     Mental Status: She is alert and oriented to person, place, and time.  Psychiatric:        Mood and Affect: Mood normal.        Behavior: Behavior normal.     Procedures  Results for orders placed or performed during the hospital encounter of 01/18/24 (from the past 24 hours)  POC Influenza A/B     Status: None   Collection Time: 01/18/24  9:53 AM  Result Value Ref Range   Influenza A, POC Negative Negative   Influenza B, POC Negative Negative    Assessment and Plan :     Discharge Instructions       1. Viral upper respiratory tract infection (Primary) - POC Influenza A/B completed in UC is negative for influenza A and influenza B. - azelastine  (ASTELIN ) 0.1 % nasal spray; Place 1 spray into both nostrils 2 (two) times daily. Use in each nostril as directed  Dispense: 30 mL; Refill: 1 - predniSONE  (DELTASONE ) 20 MG tablet; Take 2 tablets (  40 mg total) by mouth daily for 5 days.  Dispense: 10 tablet; Refill: 0  -Continue to monitor symptoms for any change in severity if there is any escalation of current symptoms or development of new symptoms follow-up in ER for further evaluation and management.      Shirley Lee B Shirley Lee    [1] No current facility-administered medications for this encounter.  Current Outpatient Medications:    azelastine  (ASTELIN ) 0.1 % nasal spray, Place 1 spray into both nostrils 2 (two) times daily. Use in each nostril as directed, Disp: 30 mL, Rfl: 1   predniSONE  (DELTASONE ) 20 MG tablet, Take 2 tablets (40 mg total) by mouth daily for 5 days., Disp: 10 tablet, Rfl: 0   albuterol  (VENTOLIN  HFA) 108 (90 Base) MCG/ACT inhaler, Inhale 1-2 puffs into the lungs every 4 (four) hours  as needed for wheezing or shortness of breath., Disp: 6.7 g, Rfl: 2   amphetamine -dextroamphetamine (ADDERALL XR) 30 MG 24 hr capsule, Take 1 capsule (30 mg total) by mouth daily., Disp: 30 capsule, Rfl: 0   Doxepin  HCl 3 MG TABS, Take 1 tablet (3 mg total) by mouth at bedtime as needed., Disp: 30 tablet, Rfl: 1   meloxicam  (MOBIC ) 7.5 MG tablet, Take 1 tablet (7.5 mg total) by mouth daily., Disp: 30 tablet, Rfl: 0 [2]  Allergies Allergen Reactions   Penicillins Other (See Comments)    Childhood allergy unknown of sypmtoms  [3]  Social History Tobacco Use   Smoking status: Never    Passive exposure: Never   Smokeless tobacco: Never  Vaping Use   Vaping status: Never Used  Substance Use Topics   Alcohol use: Yes    Alcohol/week: 3.0 standard drinks of alcohol    Types: 3 Glasses of wine per week   Drug use: Never     Shirley Lee B, NP 01/18/24 1014  "

## 2024-01-22 ENCOUNTER — Ambulatory Visit: Payer: Self-pay

## 2024-01-22 DIAGNOSIS — R059 Cough, unspecified: Secondary | ICD-10-CM

## 2024-01-22 NOTE — Telephone Encounter (Signed)
 Call #1: attempted to reach pt at [phone], no answer, LVM with instructions to call back at [phone]   Reason for Triage: Patient hurt back from coughing and coughing more than she did when she started predisone. Asking for a cough syrup.    [phone]

## 2024-01-22 NOTE — Telephone Encounter (Signed)
 FYI Only or Action Required?: Action required by provider: request for appointment.  Patient was last seen in primary care on 11/06/2023 by de [place], Quintin PARAS, MD.  Called Nurse Triage reporting Cough.  Symptoms began several days ago.  Interventions attempted: Nothing.  Symptoms are: gradually worsening.Worsening cough. Seen in UC and given Prednisone . No availability in office. Asking if PCP will call in cough medication. Please advise pt. Today.  Triage Disposition: See HCP Within 4 Hours (Or PCP Triage)      Reason for Disposition  [1] MILD difficulty breathing (e.g., minimal/no SOB at rest, SOB with walking, pulse < 100) AND [2] still present when not coughing  Answer Assessment - Initial Assessment Questions 1. ONSET: When did the cough begin?      Sunday 2. SEVERITY: How bad is the cough today?      severe 3. SPUTUM: Describe the color of your sputum (e.g., none, dry cough; clear, white, yellow, green)     yellow 4. HEMOPTYSIS: Are you coughing up any blood? If Yes, ask: How much? (e.g., flecks, streaks, tablespoons, etc.)     no 5. DIFFICULTY BREATHING: Are you having difficulty breathing? If Yes, ask: How bad is it? (e.g., mild, moderate, severe)      no 6. FEVER: Do you have a fever? If Yes, ask: What is your temperature, how was it measured, and when did it start?     no 7. CARDIAC HISTORY: Do you have any history of heart disease? (e.g., heart attack, congestive heart failure)      no 8. LUNG HISTORY: Do you have any history of lung disease?  (e.g., pulmonary embolus, asthma, emphysema)     asthma 9. PE RISK FACTORS: Do you have a history of blood clots? (or: recent major surgery, recent prolonged travel, bedridden)     no 10. OTHER SYMPTOMS: Do you have any other symptoms? (e.g., runny nose, wheezing, chest pain)       wheezing 11. PREGNANCY: Is there any chance you are pregnant? When was your last menstrual period?       no 12.  TRAVEL: Have you traveled out of the country in the last month? (e.g., travel history, exposures)       no  Protocols used: Cough - Acute Productive-A-AH

## 2024-01-23 ENCOUNTER — Encounter (HOSPITAL_COMMUNITY): Payer: Self-pay

## 2024-01-23 ENCOUNTER — Ambulatory Visit (HOSPITAL_COMMUNITY): Payer: PRIVATE HEALTH INSURANCE

## 2024-01-23 ENCOUNTER — Ambulatory Visit (HOSPITAL_COMMUNITY)
Admission: RE | Admit: 2024-01-23 | Discharge: 2024-01-23 | Disposition: A | Discharge status: Home / Self Care | Payer: PRIVATE HEALTH INSURANCE | Source: Ambulatory Visit | Attending: Internal Medicine

## 2024-01-23 VITALS — BP 128/77 | HR 93 | Temp 98.9°F | Resp 19

## 2024-01-23 DIAGNOSIS — J069 Acute upper respiratory infection, unspecified: Secondary | ICD-10-CM | POA: Diagnosis not present

## 2024-01-23 DIAGNOSIS — J4541 Moderate persistent asthma with (acute) exacerbation: Secondary | ICD-10-CM

## 2024-01-23 DIAGNOSIS — R0602 Shortness of breath: Secondary | ICD-10-CM

## 2024-01-23 MED ORDER — BUDESONIDE-FORMOTEROL FUMARATE 80-4.5 MCG/ACT IN AERO: 2.0000 | INHALATION_SPRAY | Freq: Two times a day (BID) | RESPIRATORY_TRACT | 2 refills | Status: AC

## 2024-01-23 MED ORDER — ONDANSETRON 4 MG PO TBDP: 4.0000 mg | ORAL_TABLET | Freq: Three times a day (TID) | ORAL | 0 refills | Status: DC | PRN

## 2024-01-23 MED ORDER — DEXAMETHASONE SOD PHOSPHATE PF 10 MG/ML IJ SOLN
INTRAMUSCULAR | Status: AC
  Filled 2024-01-23: qty 1

## 2024-01-23 MED ORDER — IPRATROPIUM-ALBUTEROL 0.5-2.5 (3) MG/3ML IN SOLN
RESPIRATORY_TRACT | Status: AC
  Filled 2024-01-23: qty 3

## 2024-01-23 MED ORDER — PROMETHAZINE-DM 6.25-15 MG/5ML PO SYRP: 5.0000 mL | ORAL_SOLUTION | Freq: Every evening | ORAL | 0 refills | Status: DC | PRN

## 2024-01-23 MED ORDER — IPRATROPIUM-ALBUTEROL 0.5-2.5 (3) MG/3ML IN SOLN
3.0000 mL | Freq: Once | RESPIRATORY_TRACT | Status: AC
  Administered 2024-01-23: 3 mL via RESPIRATORY_TRACT

## 2024-01-23 MED ORDER — DOXYCYCLINE HYCLATE 100 MG PO CAPS: 100.0000 mg | ORAL_CAPSULE | Freq: Two times a day (BID) | ORAL | 0 refills | Status: AC

## 2024-01-23 MED ORDER — ALBUTEROL SULFATE (2.5 MG/3ML) 0.083% IN NEBU: 2.5000 mg | INHALATION_SOLUTION | Freq: Four times a day (QID) | RESPIRATORY_TRACT | 12 refills | Status: DC | PRN

## 2024-01-23 MED ORDER — DEXAMETHASONE SOD PHOSPHATE PF 10 MG/ML IJ SOLN
10.0000 mg | Freq: Once | INTRAMUSCULAR | Status: AC
  Administered 2024-01-23: 10 mg via INTRAMUSCULAR

## 2024-01-23 NOTE — ED Triage Notes (Signed)
 Pt reports that she was sen her on Sunday and given prednisone  that finished and cough is worse. Reports now has diarrhea. Down to her last inhaler. Pt c/o back and chest pain with coughing. Not able to sleep. Can't get in with PCP til next week.

## 2024-01-23 NOTE — Discharge Instructions (Addendum)
 I am concerned that you are developing pneumonia. Please take doxycycline  antibiotic every 12 hours for 7 days. Use nebulizer breathing treatments every 6 hours on a schedule for the next 24 hours, then as needed for shortness of breath and wheezing. Use Symbicort  inhaler 2 puffs every 12 hours until your shortness of breath resolves.  This contains a long-acting medication to help open up your lungs and a steroid. We gave you a shot of steroid in the clinic today.  This will continue to help reduce inflammation to the lungs over the next 24 to 48 hours.  Take Promethazine  DM at bedtime for cough as needed. Take Zofran  every 8 hours as needed for nausea and vomiting. Please schedule an appointment with your primary care provider in the next 7 days for recheck.  If you develop new/worsening fever, chills, vomiting, shortness of breath, chest pain, or leg swelling, please go to the ER for further workup and evaluation.

## 2024-01-23 NOTE — ED Provider Notes (Signed)
 " [facility]    CSN: [id number] Arrival date & time: 01/23/24  1826      History   Chief Complaint Chief Complaint  Patient presents with   Appointment    630    HPI Shirley Lee is a 64 y.o. female.   Shirley Lee is a 64 y.o. female presenting for chief complaint of shortness of breath and cough that started 7 days ago.  She was seen at urgent care on day 3 of her illness where she tested negative for COVID and flu and was prescribed prednisone  for an asthma exacerbation.  She has been using her albuterol  inhaler frequently over the last 3 to 4 days for shortness of breath with minimal and temporary relief.  She has finished prednisone  and remains short of breath. Additionally reports chest pain associated with coughing. Denies leg swelling, orthopnea, dizziness, fever, chills, N/V/, abdominal pain, and rashes.  Recently traveled to [place] and returned via airplane, she does not know of any other recent sick contacts with similar symptoms.  History of pneumonia.  Denies recent antibiotic or steroid use in the last 90 days.  Taking over-the-counter medications with minimal relief of symptoms.  Allergic to penicillins.    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      Home Medications    Prior to Admission medications  Medication Sig Start Date End Date Taking? Authorizing Provider  albuterol  (PROVENTIL ) (2.5 MG/3ML) 0.083% nebulizer solution Take 3 mLs (2.5 mg total) by nebulization every 6 (six) hours as needed for wheezing or shortness of breath. 01/23/24  Yes Enedelia Dorna HERO, FNP  budesonide -formoterol  (SYMBICORT ) 80-4.5 MCG/ACT inhaler Inhale 2 puffs into the lungs every 12 (twelve) hours. 01/23/24  Yes Enedelia Dorna HERO, FNP  doxycycline  (VIBRAMYCIN ) 100 MG capsule Take 1 capsule (100 mg total) by mouth 2 (two) times daily for 7 days. 01/23/24 01/30/24 Yes Brayn Eckstein, Dorna HERO, FNP  ondansetron  (ZOFRAN -ODT) 4 MG disintegrating tablet Take 1 tablet (4 mg total) by mouth every 8 (eight) hours as needed for nausea or vomiting. 01/23/24  Yes Enedelia Dorna HERO, FNP  promethazine -dextromethorphan (PROMETHAZINE -DM) 6.25-15 MG/5ML syrup Take 5 mLs by mouth at bedtime as needed for cough. 01/23/24  Yes Enedelia Dorna HERO, FNP  amphetamine -dextroamphetamine (ADDERALL XR) 30 MG 24 hr capsule Take 1 capsule (30 mg total) by mouth daily. 12/21/23   [place], Quintin PARAS, MD  azelastine  (ASTELIN ) 0.1 % nasal spray Place 1 spray into both nostrils 2 (two) times daily. Use in each nostril as directed 01/18/24   Reddick, Johnathan B, NP  Doxepin  HCl 3 MG TABS Take 1 tablet (3 mg total) by mouth at bedtime as needed. 09/22/23  [place], Quintin PARAS, MD  meloxicam  (MOBIC ) 7.5 MG tablet Take 1 tablet (7.5 mg total) by mouth daily. 12/08/23   [place], Raymond J, MD  predniSONE  (DELTASONE ) 20 MG tablet Take 2 tablets (40 mg total) by mouth daily for 5 days. 01/18/24 01/23/24  Aurea Ethel NOVAK, NP    Family History Family History  Problem Relation Age of Onset   Multiple sclerosis Sister     Social History Social History[1]   Allergies   Penicillins   Review of Systems Review of  Systems Per HPI  Physical Exam Triage Vital Signs ED Triage Vitals  Encounter Vitals Group     BP 01/23/24 1841 128/77     Girls Systolic BP Percentile --      Girls Diastolic BP Percentile --      Boys Systolic BP Percentile --      Boys Diastolic BP Percentile --      Pulse Rate 01/23/24 1841 93     Resp 01/23/24 1841 19     Temp 01/23/24 1841 98.9 F (37.2 C)     Temp Source 01/23/24 1841 Oral     SpO2 01/23/24 1841 94 %     Weight --      Height --      Head Circumference --      Peak Flow --      Pain Score 01/23/24 1840 8     Pain Loc --      Pain Education --      Exclude from Growth Chart --    No data found.  Updated Vital Signs BP 128/77 (BP Location: Right Arm)   Pulse 93   Temp 98.9 F (37.2 C) (Oral)   Resp 19   LMP  (LMP Unknown)   SpO2 94%   Visual Acuity Right Eye Distance:   Left Eye Distance:   Bilateral Distance:    Right Eye Near:   Left Eye Near:    Bilateral Near:     Physical Exam Vitals and nursing note reviewed.  Constitutional:      Appearance: She is not ill-appearing or toxic-appearing.  HENT:     Head: Normocephalic and atraumatic.     Right Ear: Hearing, tympanic membrane, ear canal and external ear normal.     Left Ear: Hearing, tympanic membrane, ear canal and external ear normal.     Nose: Nose normal.     Mouth/Throat:     Lips: Pink.     Mouth: Mucous membranes are moist. No injury or oral lesions.     Dentition: Normal dentition.     Tongue: No lesions.     Pharynx: Oropharynx is clear. Uvula midline. No pharyngeal swelling, oropharyngeal exudate, posterior oropharyngeal erythema, uvula swelling or postnasal drip.     Tonsils: No tonsillar exudate.  Eyes:     General: Lids are normal. Vision grossly intact. Gaze aligned appropriately.     Extraocular Movements: Extraocular movements intact.     Conjunctiva/sclera: Conjunctivae normal.  Neck:     Trachea: Trachea and phonation normal.  Cardiovascular:     Rate  and Rhythm: Normal rate and regular rhythm.     Heart sounds: Normal heart sounds, S1 normal and S2 normal.  Pulmonary:     Effort: Pulmonary effort is normal. No respiratory distress.     Breath sounds: Decreased air movement present. Wheezing present. No rhonchi or rales.     Comments: Audible wheezing heard without stethoscope with frequent harsh dry cough  on exam.  Decreased lung sounds to the bilateral upper lungs, expiratory wheezing heard to bilateral lower lungs.  Speaking in full sentences without difficulty. Chest:     Chest wall: No tenderness.  Musculoskeletal:     Cervical back: Neck supple.  Lymphadenopathy:     Cervical: No cervical adenopathy.  Skin:    General: Skin is warm and dry.     Capillary Refill: Capillary refill takes less than 2 seconds.     Findings: No rash.  Neurological:     General: No focal deficit present.     Mental Status: She is alert and oriented to person, place, and time. Mental status is at baseline.     Cranial Nerves: No dysarthria or facial asymmetry.  Psychiatric:        Mood and Affect: Mood normal.        Speech: Speech normal.        Behavior: Behavior normal.        Thought Content: Thought content normal.        Judgment: Judgment normal.      UC Treatments / Results  Labs (all labs ordered are listed, but only abnormal results are displayed) Labs Reviewed - No data to display  EKG   Radiology DG Chest 2 View Result Date: 01/23/2024 CLINICAL DATA:  Worsening cough with diarrhea and chest pain. EXAM: CHEST - 2 VIEW COMPARISON:  December 03, 2022 FINDINGS: The heart size and mediastinal contours are within normal limits. Decreased lung volumes are seen. Mild diffuse chronic appearing increased lung markings are seen. Mild atelectatic changes are noted within the bilateral lung bases. No pleural effusion or pneumothorax is identified. Multilevel degenerative changes are present throughout the thoracic spine. IMPRESSION: Low lung  volumes with mild bibasilar atelectasis. Electronically Signed   By: Suzen Dials M.D.   On: 01/23/2024 19:21    Procedures Procedures (including critical care time)  Medications Ordered in UC Medications  dexamethasone  (DECADRON ) injection 10 mg (has no administration in time range)  ipratropium-albuterol  (DUONEB) 0.5-2.5 (3) MG/3ML nebulizer solution 3 mL (3 mLs Nebulization Given 01/23/24 1850)    Initial Impression / Assessment and Plan / UC Course  I have reviewed the triage vital signs and the nursing notes.  Pertinent labs & imaging results that were available during my care of the patient were reviewed by me and considered in my medical decision making (see chart for details).     *** Final Clinical Impressions(s) / UC Diagnoses   Final diagnoses:  Shortness of breath  Community acquired pneumonia of left lower lobe of lung  Moderate persistent asthma with acute exacerbation     Discharge Instructions      I am concerned that you are developing pneumonia. Please take doxycycline  antibiotic every 12 hours for 7 days. Use nebulizer breathing treatments every 6 hours on a schedule for the next 24 hours, then as needed for shortness of breath and wheezing. Use Symbicort  inhaler 2 puffs every 12 hours until your shortness of breath resolves.  This contains a long-acting medication to help open up your lungs and a steroid. We gave you a shot of steroid in the clinic today.  This will continue to help reduce inflammation to the lungs over the next 24 to 48 hours.  Take Promethazine  DM at bedtime for cough as needed. Take Zofran  every 8 hours as needed for nausea and vomiting. Please schedule an appointment with your primary care provider in the next 7 days for recheck.  If you develop new/worsening fever, chills, vomiting, shortness of breath, chest pain, or leg swelling, please go to the ER for further workup and evaluation.     ED Prescriptions     Medication  Sig Dispense Auth. Provider   doxycycline  (VIBRAMYCIN ) 100 MG capsule Take 1 capsule (100 mg total) by mouth 2 (two) times daily for 7 days. 14 capsule Shelbi Vaccaro M, FNP   promethazine -dextromethorphan (PROMETHAZINE -DM) 6.25-15 MG/5ML syrup Take 5 mLs by mouth at bedtime as needed for cough. 118 mL Enedelia Going M, FNP   albuterol  (PROVENTIL ) (2.5 MG/3ML) 0.083% nebulizer solution Take 3 mLs (2.5 mg total) by nebulization every 6 (six) hours as needed for wheezing or shortness of breath. 75 mL Enedelia Going M, FNP   budesonide -formoterol  (SYMBICORT ) 80-4.5 MCG/ACT inhaler Inhale 2 puffs into the lungs every 12 (twelve) hours. 1 each Enedelia Going HERO, FNP   ondansetron  (ZOFRAN -ODT) 4 MG disintegrating tablet Take 1 tablet (4 mg total) by mouth every 8 (eight) hours as needed for nausea or vomiting. 20 tablet Enedelia Going HERO, FNP      PDMP not reviewed this encounter.      [1] Social History Tobacco Use   Smoking status: Never    Passive exposure: Never   Smokeless tobacco: Never  Vaping Use   Vaping status: Never Used  Substance Use Topics   Alcohol use: Yes    Alcohol/week: 3.0 standard drinks of alcohol    Types: 3 Glasses of wine per week   Drug use: Never  "

## 2024-01-27 ENCOUNTER — Ambulatory Visit (INDEPENDENT_AMBULATORY_CARE_PROVIDER_SITE_OTHER): Payer: PRIVATE HEALTH INSURANCE | Admitting: Family Medicine

## 2024-01-27 ENCOUNTER — Encounter (HOSPITAL_BASED_OUTPATIENT_CLINIC_OR_DEPARTMENT_OTHER): Payer: Self-pay | Admitting: Family Medicine

## 2024-01-27 VITALS — BP 126/71 | HR 104 | Temp 98.8°F | Resp 20 | Ht 64.5 in | Wt 151.0 lb

## 2024-01-27 DIAGNOSIS — R059 Cough, unspecified: Secondary | ICD-10-CM | POA: Diagnosis not present

## 2024-01-27 MED ORDER — MELOXICAM 7.5 MG PO TABS: 7.5000 mg | ORAL_TABLET | Freq: Every day | ORAL | 0 refills | Status: DC

## 2024-01-27 MED ORDER — BENZONATATE 200 MG PO CAPS: 200.0000 mg | ORAL_CAPSULE | Freq: Three times a day (TID) | ORAL | 0 refills | Status: DC | PRN

## 2024-01-27 NOTE — Progress Notes (Signed)
" ° ° °  Procedures performed today:    None.  Independent interpretation of notes and tests performed by another provider:   None.  Brief History, Exam, Impression, and Recommendations:    BP 126/71 (BP Location: Right Arm, Patient Position: Sitting, Cuff Size: Normal)   Pulse (!) 104   Temp 98.8 F (37.1 C) (Oral)   Resp 20   Ht 5' 4.5 (1.638 m)   Wt 151 lb (68.5 kg)   LMP  (LMP Unknown)   SpO2 100%   BMI 25.52 kg/m   Discussed the use of AI scribe software for clinical note transcription with the patient, who gave verbal consent to proceed.  History of Present Illness Shirley Lee is a 64 year old female who presents with a persistent cough and chest discomfort.  On January 4th, she visited urgent care due to a runny nose and was tested for flu and COVID, both of which were negative. She was prescribed a five-day course of steroids.  While in [place] , her cough worsened significantly, becoming constant. Upon returning, she sought care again at urgent care, where chest x-rays were performed, and she was given a breathing treatment, a new inhaler, and more steroids.  Currently, she experiences a persistent cough that causes widespread body aches and a sensation of having 'a rock on my chest.' The breathing treatment has provided some relief, but the cough syrup has not been effective. She has been taking doxycycline  twice daily, Symbicort , and Tylenol  for body aches.  She has difficulty sleeping, often only getting two hours of sleep due to the cough, although she managed three to four hours last night. She has tried Robitussin without relief and has been taking Tylenol  for suspected fevers, which seem to improve with medication.  She recalls receiving a steroid injection during her second urgent care visit but did not notice improvement with the initial steroid treatment. No use of meloxicam  at home and has not used Tessalon  Perles recently, although she has used them in  the past.  On exam, patient is in no acute distress, vital signs stable.  Cardiovascular exam with regular rate and rhythm, lungs largely clear to auscultation bilaterally, occasional expiratory wheeze appreciated.  Bilateral external auditory canals are clear, normal-appearing tympanic membranes.  Mild pharyngeal erythema.  Cough, unspecified type Assessment & Plan: Persistent cough and chest discomfort related to URI. Limited relief from initial treatment. Chest x-ray showed atelectasis without pneumonia. Doxycycline  prescribed by UC. Tessalon  Perles considered for cough management. Meloxicam  suggested for muscle pain. - Continue doxycycline  as prescribed. - Prescribed meloxicam  for muscle pain and soreness. - Prescribed Tessalon  Perles for cough management.  If cough is inadequately controlled, recommend reaching back out and we can send prescription for cough syrup. - Continue Symbicort  inhaler twice daily.   Other orders -     Benzonatate ; Take 1 capsule (200 mg total) by mouth 3 (three) times daily as needed for cough.  Dispense: 45 capsule; Refill: 0 -     Meloxicam ; Take 1 tablet (7.5 mg total) by mouth daily.  Dispense: 30 tablet; Refill: 0  Return in about 1 month (around 02/27/2024) for med check.   ___________________________________________ Masoud Nyce [place], MD, ABFM, CAQSM Primary Care and Sports [facility] "

## 2024-01-27 NOTE — Assessment & Plan Note (Signed)
 Persistent cough and chest discomfort related to URI. Limited relief from initial treatment. Chest x-ray showed atelectasis without pneumonia. Doxycycline  prescribed by UC. Tessalon  Perles considered for cough management. Meloxicam  suggested for muscle pain. - Continue doxycycline  as prescribed. - Prescribed meloxicam  for muscle pain and soreness. - Prescribed Tessalon  Perles for cough management.  If cough is inadequately controlled, recommend reaching back out and we can send prescription for cough syrup. - Continue Symbicort  inhaler twice daily.

## 2024-02-10 ENCOUNTER — Encounter: Payer: PRIVATE HEALTH INSURANCE | Admitting: Student in an Organized Health Care Education/Training Program

## 2024-02-12 NOTE — Telephone Encounter (Signed)
 Patient came in to office today 02/12/2024 is a transfer of car for Monday 02/16/2024 patient is running low on tessalon  and is asking a complimentary refill to be sent in until her visit.

## 2024-02-13 ENCOUNTER — Other Ambulatory Visit: Payer: Self-pay | Admitting: Student in an Organized Health Care Education/Training Program

## 2024-02-13 MED ORDER — BENZONATATE 200 MG PO CAPS: 200.0000 mg | ORAL_CAPSULE | Freq: Three times a day (TID) | ORAL | 0 refills | Status: DC | PRN

## 2024-02-13 NOTE — Telephone Encounter (Signed)
 Tessalon  refilled.  Thank you.

## 2024-02-16 ENCOUNTER — Encounter: Payer: PRIVATE HEALTH INSURANCE | Admitting: Student in an Organized Health Care Education/Training Program

## 2024-02-20 ENCOUNTER — Ambulatory Visit: Payer: PRIVATE HEALTH INSURANCE | Admitting: Student in an Organized Health Care Education/Training Program

## 2024-02-20 ENCOUNTER — Encounter: Payer: Self-pay | Admitting: Student in an Organized Health Care Education/Training Program

## 2024-02-20 VITALS — BP 127/85 | HR 96 | Temp 98.2°F | Ht 64.5 in | Wt 151.0 lb

## 2024-02-20 DIAGNOSIS — F988 Other specified behavioral and emotional disorders with onset usually occurring in childhood and adolescence: Secondary | ICD-10-CM

## 2024-02-20 DIAGNOSIS — R059 Cough, unspecified: Secondary | ICD-10-CM

## 2024-02-20 MED ORDER — AMPHETAMINE-DEXTROAMPHET ER 30 MG PO CP24: 30.0000 mg | ORAL_CAPSULE | Freq: Every day | ORAL | 0 refills | Status: AC

## 2024-02-20 MED ORDER — MELOXICAM 7.5 MG PO TABS: 7.5000 mg | ORAL_TABLET | Freq: Every day | ORAL | 0 refills | Status: AC

## 2024-02-20 MED ORDER — BENZONATATE 200 MG PO CAPS: 200.0000 mg | ORAL_CAPSULE | Freq: Three times a day (TID) | ORAL | 0 refills | Status: AC | PRN

## 2024-02-20 NOTE — Assessment & Plan Note (Signed)
 She has a persistent cough following an upper respiratory infection, likely pneumonia. The cough is improving but still occurs after exertion. There is no shortness of breath, fever, or congestion. The cough may last 6-12 weeks, and a post-infectious reactive airway disease is considered. Discontinue Symbicort  and albuterol  inhalers, I think their benefit is likely run its course. Continue Tessalon  Pearls for cough management. Monitor symptoms and consider further workup with pulmonary function testing if the cough persists beyond 12 weeks. Gradually increase physical activity and exercise.

## 2024-02-20 NOTE — Patient Instructions (Signed)
" °  VISIT SUMMARY: During your visit, we discussed your persistent cough following a recent respiratory infection, your ongoing management of ADHD, and general health maintenance. Your cough has improved but still persists, and we reviewed your current medications and treatment plan. We also discussed your ADHD management and general health recommendations.  YOUR PLAN: -POST-INFECTIOUS COUGH: A post-infectious cough is a persistent cough that follows a respiratory infection, such as pneumonia. Your cough is improving but still occurs after exertion. We will discontinue the Symbicort  and albuterol  inhalers and continue with Tessalon  Pearls for cough management. Monitor your symptoms, and if the cough persists beyond 12 weeks, we may consider further evaluation. Gradually increase your physical activity and exercise.  -ATTENTION DEFICIT DISORDER IN ADULT: Attention deficit disorder (ADHD) is a condition that affects focus, self-control, and other important skills. You have been managing your ADHD with Adderall XR 30 mg, which you tolerate well without side effects. Continue taking your medication as prescribed. We performed a urine drug screen as required and will schedule follow-up appointments every three months for medication management.  -GENERAL HEALTH MAINTENANCE: You are generally healthy with no significant past medical history. Your recent labs showed slightly elevated cholesterol, a normal chest x-ray, and a BMI of 25. We recommend scheduling a regular physical examination in the fall. Continue to focus on healthy nutrition and weight management.  INSTRUCTIONS: Please monitor your cough symptoms and if they persist beyond 12 weeks, contact our office for further evaluation. Continue taking your Adderall XR 30 mg as prescribed and schedule follow-up appointments every three months. Schedule a regular physical examination in the fall and focus on healthy nutrition and weight  management.    Contains text generated by [employer].   "

## 2024-02-20 NOTE — Assessment & Plan Note (Signed)
 Diagnosed with ADHD seven years ago, she tolerates Adderall XR 30 mg well without side effects.  We talked about the risks of using stimulant medications in older adults.  She still finds a lot of benefit from this medication, still working a high stress job and fundraising, and has had no side effects of the medication.  She also has no cardiovascular risk factors, no hypertension or heart disease.  Continue Adderall extended release 30 mg. A urine drug screen was performed. Schedule follow-up every three months for medication management.  I reviewed the database which was appropriate, looks like she is using this medication pretty infrequently as the last fill was about 2 months ago.

## 2024-02-20 NOTE — Progress Notes (Signed)
 "  New Patient Office Visit  Patient ID: Shirley Lee, Female   DOB: 01-29-1960 64 y.o. MRN: [id number]  Chief Complaint  Patient presents with   New Patient (Initial Visit)    Patient is here to establish care.    Subjective:     Shirley Lee presents to establish care  HPI  Discussed the use of AI scribe software for clinical note transcription with the patient, who gave verbal consent to proceed.  History of Present Illness Shirley Lee is a 64 year old female with adult-onset asthma who presents with a persistent cough following a recent respiratory infection.  She has been experiencing a persistent cough that began before a trip to [place], where it worsened significantly. Upon returning, she visited the emergency room due to the severity of the cough and was treated with a nebulizer, inhalers, and antibiotics, including a shot for suspected pneumonia. She was also prescribed liquid cough syrup and Tessalon  Pearls. The cough has improved but persists, with episodes that are difficult to stop. No current shortness of breath or fever, but coughing can lead to chest tightness.  She has a history of adult-onset asthma and uses inhalers, including Symbicort , which she started in January after her emergency room visit. She uses the inhalers only when the cough starts and not daily. No sinus congestion, post-nasal drip, reflux, and no history of smoking. She reports feeling better overall, estimating her recovery at 95% and notes improvement in her ability to work without feeling exhausted.  She has been on Adderall for approximately seven years for ADHD, prescribed by her regular doctor, and finds it beneficial. She takes it regularly but sometimes skips doses on weekends. No side effects such as racing heart, trouble sleeping, or anxiety.  Her past medical history includes a hysterectomy at age 13 and an appendectomy at age 60. She has no history of high blood  pressure, heart disease, or strokes. She is generally healthy, though she is concerned about her weight. 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 She has not exercised since Christmas but feels ready to resume physical activity.   Outpatient Encounter Medications as of 02/20/2024  Medication Sig   budesonide -formoterol  (SYMBICORT ) 80-4.5 MCG/ACT inhaler Inhale 2 puffs into the lungs every 12 (twelve) hours.   [DISCONTINUED] amphetamine -dextroamphetamine (ADDERALL XR) 30 MG 24 hr capsule Take 1 capsule (30 mg total) by mouth daily.   [DISCONTINUED] benzonatate  (TESSALON ) 200 MG capsule Take 1 capsule (200 mg total) by mouth 3 (three) times daily as needed for cough.   [DISCONTINUED] meloxicam  (MOBIC ) 7.5 MG tablet Take 1 tablet (7.5 mg total) by mouth daily.   amphetamine -dextroamphetamine (ADDERALL XR) 30 MG 24 hr capsule Take 1 capsule (30 mg total) by mouth daily.   benzonatate  (TESSALON ) 200 MG capsule Take 1 capsule (200 mg total) by mouth 3 (three) times daily as needed for cough.   meloxicam  (MOBIC ) 7.5 MG tablet Take 1 tablet (7.5 mg total) by mouth daily.   [DISCONTINUED] albuterol  (PROVENTIL ) (2.5 MG/3ML) 0.083% nebulizer solution Take 3 mLs (2.5 mg total) by nebulization every 6 (six) hours as needed for wheezing or shortness of breath.   [DISCONTINUED] azelastine  (ASTELIN ) 0.1 % nasal spray Place 1 spray into both nostrils 2 (two) times daily. Use in each nostril as directed   [DISCONTINUED] Doxepin  HCl 3 MG TABS Take 1 tablet (3 mg total) by mouth at bedtime as needed.   [DISCONTINUED] ondansetron  (ZOFRAN -ODT) 4 MG disintegrating tablet Take 1  tablet (4 mg total) by mouth every 8 (eight) hours as needed for nausea or vomiting.   [DISCONTINUED] promethazine -dextromethorphan (PROMETHAZINE -DM) 6.25-15 MG/5ML syrup Take 5 mLs by mouth at bedtime as needed for cough.   No facility-administered encounter medications on file as of 02/20/2024.    Past Medical History:  Diagnosis Date    Adult ADHD    Chicken pox    History of colon polyps    History of pneumonia 01/17/2022   Lisfranc's sprain, left, subsequent encounter 01/25/2019    Past Surgical History:  Procedure Laterality Date   ABDOMINAL HYSTERECTOMY  1989    Family History  Problem Relation Age of Onset   Multiple sclerosis Sister      Objective:    BP 127/85   Pulse 96   Temp 98.2 F (36.8 C) (Oral)   Ht 5' 4.5 (1.638 m)   Wt 151 lb (68.5 kg)   LMP  (LMP Unknown)   SpO2 98%   BMI 25.52 kg/m   Physical Exam  Gen: Well-appearing woman Ears: Normal hearing, normal tympanic membranes bilaterally Neck: Normal thyroid , no nodules or adenopathy Heart: Regular, no murmur Lungs: Unlabored, clear throughout, frequent coughing, no wheezing or crackles Ext: Warm, no edema Neuro: Alert, conversational, full strength upper lower extremities      Assessment & Plan:   Problem List Items Addressed This Visit       High   Attention deficit disorder (ADD) in adult (Chronic)   Diagnosed with ADHD seven years ago, she tolerates Adderall XR 30 mg well without side effects.  We talked about the risks of using stimulant medications in older adults.  She still finds a lot of benefit from this medication, still working a high stress job and fundraising, and has had no side effects of the medication.  She also has no cardiovascular risk factors, no hypertension or heart disease.  Continue Adderall extended release 30 mg. A urine drug screen was performed. Schedule follow-up every three months for medication management.  I reviewed the database which was appropriate, looks like she is using this medication pretty infrequently as the last fill was about 2 months ago.      Relevant Medications   amphetamine -dextroamphetamine (ADDERALL XR) 30 MG 24 hr capsule   Other Relevant Orders   DRUG MONITORING, PANEL 8 WITH CONFIRMATION, URINE   Cough - Primary   She has a persistent cough following an upper respiratory  infection, likely pneumonia. The cough is improving but still occurs after exertion. There is no shortness of breath, fever, or congestion. The cough may last 6-12 weeks, and a post-infectious reactive airway disease is considered. Discontinue Symbicort  and albuterol  inhalers, I think their benefit is likely run its course. Continue Tessalon  Pearls for cough management. Monitor symptoms and consider further workup with pulmonary function testing if the cough persists beyond 12 weeks. Gradually increase physical activity and exercise.      Relevant Medications   benzonatate  (TESSALON ) 200 MG capsule    Return in about 3 months (around 05/19/2024).   Cleatus Debby Specking, MD [facility]     "

## 2024-04-19 ENCOUNTER — Ambulatory Visit: Payer: PRIVATE HEALTH INSURANCE | Admitting: Physician Assistant

## 2024-05-21 ENCOUNTER — Ambulatory Visit: Payer: PRIVATE HEALTH INSURANCE | Admitting: Student in an Organized Health Care Education/Training Program
